# Patient Record
Sex: Female | Born: 1960 | Race: White | Hispanic: No | Marital: Single | Smoking: Current every day smoker
Health system: Southern US, Community
[De-identification: ages and names within clinical notes are randomized; demographics above are authoritative.]

## PROBLEM LIST (undated history)

## (undated) DIAGNOSIS — J302 Other seasonal allergic rhinitis: Secondary | ICD-10-CM

## (undated) DIAGNOSIS — J449 Chronic obstructive pulmonary disease, unspecified: Secondary | ICD-10-CM

## (undated) DIAGNOSIS — Z720 Tobacco use: Secondary | ICD-10-CM

## (undated) DIAGNOSIS — R011 Cardiac murmur, unspecified: Secondary | ICD-10-CM

## (undated) DIAGNOSIS — J45909 Unspecified asthma, uncomplicated: Secondary | ICD-10-CM

## (undated) DIAGNOSIS — C439 Malignant melanoma of skin, unspecified: Secondary | ICD-10-CM

## (undated) DIAGNOSIS — F329 Major depressive disorder, single episode, unspecified: Secondary | ICD-10-CM

## (undated) DIAGNOSIS — I10 Essential (primary) hypertension: Secondary | ICD-10-CM

## (undated) DIAGNOSIS — R51 Headache: Secondary | ICD-10-CM

## (undated) HISTORY — DX: Cardiac murmur, unspecified: R01.1

## (undated) HISTORY — DX: Essential (primary) hypertension: I10

## (undated) HISTORY — PX: NOSE SURGERY: SHX723

## (undated) HISTORY — DX: Malignant melanoma of skin, unspecified: C43.9

## (undated) HISTORY — PX: TONSILLECTOMY: SUR1361

## (undated) HISTORY — PX: THROAT SURGERY: SHX803

## (undated) HISTORY — DX: Other seasonal allergic rhinitis: J30.2

## (undated) HISTORY — PX: ABDOMINAL HYSTERECTOMY: SHX81

---

## 2004-06-02 ENCOUNTER — Emergency Department (HOSPITAL_COMMUNITY): Admission: EM | Admit: 2004-06-02 | Discharge: 2004-06-02 | Payer: Self-pay | Admitting: Emergency Medicine

## 2004-10-26 ENCOUNTER — Emergency Department (HOSPITAL_COMMUNITY): Admission: EM | Admit: 2004-10-26 | Discharge: 2004-10-26 | Payer: Self-pay | Admitting: Emergency Medicine

## 2004-12-09 ENCOUNTER — Ambulatory Visit: Payer: Self-pay | Admitting: Internal Medicine

## 2005-08-19 ENCOUNTER — Emergency Department (HOSPITAL_COMMUNITY): Admission: EM | Admit: 2005-08-19 | Discharge: 2005-08-19 | Payer: Self-pay | Admitting: Emergency Medicine

## 2006-12-19 ENCOUNTER — Emergency Department (HOSPITAL_COMMUNITY): Admission: EM | Admit: 2006-12-19 | Discharge: 2006-12-19 | Payer: Self-pay | Admitting: Emergency Medicine

## 2007-06-29 ENCOUNTER — Emergency Department (HOSPITAL_COMMUNITY): Admission: EM | Admit: 2007-06-29 | Discharge: 2007-06-29 | Payer: Self-pay | Admitting: Emergency Medicine

## 2008-10-07 ENCOUNTER — Emergency Department (HOSPITAL_COMMUNITY): Admission: EM | Admit: 2008-10-07 | Discharge: 2008-10-07 | Payer: Self-pay | Admitting: Emergency Medicine

## 2008-11-22 ENCOUNTER — Emergency Department (HOSPITAL_COMMUNITY): Admission: EM | Admit: 2008-11-22 | Discharge: 2008-11-22 | Payer: Self-pay | Admitting: Family Medicine

## 2009-12-17 ENCOUNTER — Emergency Department (HOSPITAL_COMMUNITY): Admission: EM | Admit: 2009-12-17 | Discharge: 2009-12-17 | Payer: Self-pay | Admitting: Emergency Medicine

## 2010-04-11 ENCOUNTER — Emergency Department (HOSPITAL_COMMUNITY): Admission: EM | Admit: 2010-04-11 | Discharge: 2010-04-11 | Payer: Self-pay | Admitting: Emergency Medicine

## 2011-02-07 ENCOUNTER — Emergency Department (HOSPITAL_COMMUNITY)
Admission: EM | Admit: 2011-02-07 | Discharge: 2011-02-07 | Disposition: A | Discharge status: Home / Self Care | Payer: PRIVATE HEALTH INSURANCE | Attending: Emergency Medicine | Admitting: Emergency Medicine

## 2011-02-07 DIAGNOSIS — R062 Wheezing: Secondary | ICD-10-CM | POA: Insufficient documentation

## 2011-02-07 DIAGNOSIS — R0989 Other specified symptoms and signs involving the circulatory and respiratory systems: Secondary | ICD-10-CM | POA: Diagnosis not present

## 2011-02-07 DIAGNOSIS — J449 Chronic obstructive pulmonary disease, unspecified: Secondary | ICD-10-CM | POA: Diagnosis not present

## 2011-02-07 DIAGNOSIS — R0602 Shortness of breath: Secondary | ICD-10-CM | POA: Insufficient documentation

## 2011-02-07 DIAGNOSIS — R0609 Other forms of dyspnea: Secondary | ICD-10-CM | POA: Insufficient documentation

## 2011-02-07 DIAGNOSIS — J4489 Other specified chronic obstructive pulmonary disease: Secondary | ICD-10-CM | POA: Insufficient documentation

## 2011-02-16 ENCOUNTER — Emergency Department (HOSPITAL_COMMUNITY)
Admission: EM | Admit: 2011-02-16 | Discharge: 2011-02-16 | Disposition: A | Discharge status: Home / Self Care | Payer: PRIVATE HEALTH INSURANCE | Attending: Emergency Medicine | Admitting: Emergency Medicine

## 2011-02-16 DIAGNOSIS — R0989 Other specified symptoms and signs involving the circulatory and respiratory systems: Secondary | ICD-10-CM | POA: Insufficient documentation

## 2011-02-16 DIAGNOSIS — J449 Chronic obstructive pulmonary disease, unspecified: Secondary | ICD-10-CM | POA: Insufficient documentation

## 2011-02-16 DIAGNOSIS — R0602 Shortness of breath: Secondary | ICD-10-CM | POA: Insufficient documentation

## 2011-02-16 DIAGNOSIS — J4489 Other specified chronic obstructive pulmonary disease: Secondary | ICD-10-CM | POA: Insufficient documentation

## 2011-02-16 DIAGNOSIS — R0609 Other forms of dyspnea: Secondary | ICD-10-CM | POA: Insufficient documentation

## 2011-02-17 ENCOUNTER — Inpatient Hospital Stay (HOSPITAL_COMMUNITY)
Admission: EM | Admit: 2011-02-17 | Discharge: 2011-02-19 | DRG: 192 | Disposition: A | Discharge status: Home / Self Care | Payer: PRIVATE HEALTH INSURANCE | Attending: Internal Medicine | Admitting: Internal Medicine

## 2011-02-17 ENCOUNTER — Emergency Department (HOSPITAL_COMMUNITY): Payer: PRIVATE HEALTH INSURANCE

## 2011-02-17 DIAGNOSIS — J440 Chronic obstructive pulmonary disease with acute lower respiratory infection: Principal | ICD-10-CM | POA: Diagnosis present

## 2011-02-17 DIAGNOSIS — J209 Acute bronchitis, unspecified: Principal | ICD-10-CM | POA: Diagnosis present

## 2011-02-17 DIAGNOSIS — I10 Essential (primary) hypertension: Secondary | ICD-10-CM | POA: Diagnosis present

## 2011-02-17 DIAGNOSIS — T380X5A Adverse effect of glucocorticoids and synthetic analogues, initial encounter: Secondary | ICD-10-CM | POA: Diagnosis present

## 2011-02-17 DIAGNOSIS — F17200 Nicotine dependence, unspecified, uncomplicated: Secondary | ICD-10-CM | POA: Diagnosis present

## 2011-02-17 DIAGNOSIS — E139 Other specified diabetes mellitus without complications: Secondary | ICD-10-CM | POA: Diagnosis present

## 2011-02-17 LAB — CBC
Hemoglobin: 13.4 g/dL (ref 12.0–15.0)
MCH: 30.7 pg (ref 26.0–34.0)
Platelets: 244 10*3/uL (ref 150–400)
RBC: 4.37 MIL/uL (ref 3.87–5.11)
WBC: 5.6 10*3/uL (ref 4.0–10.5)

## 2011-02-17 LAB — CARDIAC PANEL(CRET KIN+CKTOT+MB+TROPI)
CK, MB: 1.9 ng/mL (ref 0.3–4.0)
Relative Index: INVALID (ref 0.0–2.5)
Relative Index: INVALID (ref 0.0–2.5)
Troponin I: 0.01 ng/mL (ref 0.00–0.06)

## 2011-02-17 LAB — D-DIMER, QUANTITATIVE: D-Dimer, Quant: 0.22 ug/mL-FEU (ref 0.00–0.48)

## 2011-02-17 LAB — COMPREHENSIVE METABOLIC PANEL
ALT: 40 U/L — ABNORMAL HIGH (ref 0–35)
AST: 31 U/L (ref 0–37)
Albumin: 3.8 g/dL (ref 3.5–5.2)
Alkaline Phosphatase: 92 U/L (ref 39–117)
CO2: 24 mEq/L (ref 19–32)
Chloride: 109 mEq/L (ref 96–112)
GFR calc Af Amer: >60 mL/min (ref >60)
GFR calc non Af Amer: >60 mL/min (ref >60)
Potassium: 4 mEq/L (ref 3.5–5.1)
Sodium: 141 mEq/L (ref 135–145)
Total Bilirubin: 0.5 mg/dL (ref 0.3–1.2)

## 2011-02-17 LAB — DIFFERENTIAL
Basophils Relative: 1 % (ref 0–1)
Eosinophils Absolute: 0 10*3/uL (ref 0.0–0.7)
Monocytes Relative: 1 % — ABNORMAL LOW (ref 3–12)
Neutro Abs: 5.1 10*3/uL (ref 1.7–7.7)
Neutrophils Relative %: 91 % — ABNORMAL HIGH (ref 43–77)

## 2011-02-17 LAB — TROPONIN I: Troponin I: 0.01 ng/mL (ref 0.00–0.06)

## 2011-02-17 LAB — GLUCOSE, CAPILLARY: Glucose-Capillary: 185 mg/dL — ABNORMAL HIGH (ref 70–99)
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
## 2011-02-18 LAB — COMPREHENSIVE METABOLIC PANEL
AST: 33 U/L (ref 0–37)
Albumin: 3.7 g/dL (ref 3.5–5.2)
BUN: 16 mg/dL (ref 6–23)
Creatinine, Ser: 0.81 mg/dL (ref 0.4–1.2)
GFR calc Af Amer: >60 mL/min (ref >60)
Potassium: 4.6 mEq/L (ref 3.5–5.1)
Total Protein: 7.1 g/dL (ref 6.0–8.3)

## 2011-02-18 LAB — DIFFERENTIAL
Basophils Relative: 0 % (ref 0–1)
Eosinophils Absolute: 0 10*3/uL (ref 0.0–0.7)
Eosinophils Relative: 0 % (ref 0–5)
Lymphs Abs: 0.5 10*3/uL — ABNORMAL LOW (ref 0.7–4.0)
Neutrophils Relative %: 92 % — ABNORMAL HIGH (ref 43–77)

## 2011-02-18 LAB — CBC
MCH: 30.4 pg (ref 26.0–34.0)
MCV: 92.9 fL (ref 78.0–100.0)
Platelets: 270 10*3/uL (ref 150–400)
RBC: 4.48 MIL/uL (ref 3.87–5.11)
RDW: 13.1 % (ref 11.5–15.5)
WBC: 8.7 10*3/uL (ref 4.0–10.5)

## 2011-02-18 LAB — GLUCOSE, CAPILLARY
Glucose-Capillary: 143 mg/dL — ABNORMAL HIGH (ref 70–99)
Glucose-Capillary: 186 mg/dL — ABNORMAL HIGH (ref 70–99)

## 2011-02-19 LAB — CBC
MCHC: 32.8 g/dL (ref 30.0–36.0)
Platelets: 265 10*3/uL (ref 150–400)
RDW: 13.1 % (ref 11.5–15.5)
WBC: 12.9 10*3/uL — ABNORMAL HIGH (ref 4.0–10.5)

## 2011-02-19 LAB — GLUCOSE, CAPILLARY: Glucose-Capillary: 109 mg/dL — ABNORMAL HIGH (ref 70–99)

## 2011-02-24 LAB — CULTURE, BLOOD (ROUTINE X 2)
Culture  Setup Time: 201204120013
Culture: NO GROWTH

## 2011-03-01 NOTE — Discharge Summary (Signed)
  [name], Lori Owens               ACCOUNT NO.:  [id number]  MEDICAL RECORD NO.:  [id number]           PATIENT TYPE:  I  LOCATION:  1533                         FACILITY:  [facility]  PHYSICIAN:  Hollice Espy, M.D.DATE OF BIRTH:  1961-03-02  DATE OF ADMISSION:  02/17/2011 DATE OF DISCHARGE:  02/19/2011                              DISCHARGE SUMMARY   PRIMARY CARE PHYSICIAN:  She has never established with a primary care physician.  I have given her a number of primary care doctors in town to establish with.  DISCHARGE DIAGNOSES: 1. Chronic obstructive pulmonary disease exacerbation. 2. Chronic tobacco abuse, quit 2 weeks ago. 3. Acute bronchitis episode.  DISCHARGE MEDICATIONS:  Discharge medications for this patient are as follows: 1. Avelox 400 mg p.o. daily. 2. Prednisone 10 mg taper, 60 mg on day #1, 50 mg on day #2, and so on     until finished. 3. Advair 250/50 one puff b.i.d. 4. Albuterol inhaler 2 puffs inhaled every 6 hours as needed. 5. Os-Cal D 1 tablet p.o. daily. 6. Fish oil 1 tablet p.o. daily. 7. Loratadine 10 p.o. daily. 8. Magnesium p.o. daily. 9. Multivitamin p.o. daily. 10.Vitamin B complex daily.  HOSPITAL COURSE:  The patient is a 50-year white female with past medical history of COPD and tobacco abuse who initially 2 weeks ago came in complaining of productive cough, found to have an elevated white count, and shortness of breath consistent with COPD exacerbation.  She showed no signs of pneumonia on x-ray.  She was started on IV Avelox, steroids, nebulizers and oxygen.  Over the next 2 days, she improved. Her steroids were tapered down.  She was still having some wheezing by day of discharge but her breathing was much better on room air and she is being discharged to home.  She will continue on prednisone taper, several more days of antibiotics for a total of 7-day course and she is advised to establish with a primary care physician.  She also has  not been to her pulmonologist in several years and she says she will indeed follow up.  DISPOSITION:  Her disposition is improved.  ACTIVITY:  Slowly increase.  DISCHARGE DIET:  Regular diet.  She is being discharged to home.  She tells me that she will definitely stay off the smoking.     Hollice Espy, M.D.     SKK/MEDQ  D:  02/19/2011  T:  02/19/2011  Job:  [id number]  cc:   [facility]  Electronically Signed by Virginia Rochester M.D. on 03/01/2011 02:02:06 PM

## 2011-05-23 ENCOUNTER — Emergency Department (HOSPITAL_COMMUNITY)
Admission: EM | Admit: 2011-05-23 | Discharge: 2011-05-23 | Discharge status: Against Medical Advice | Payer: PRIVATE HEALTH INSURANCE | Attending: Emergency Medicine | Admitting: Emergency Medicine

## 2011-05-23 DIAGNOSIS — J449 Chronic obstructive pulmonary disease, unspecified: Secondary | ICD-10-CM | POA: Insufficient documentation

## 2011-05-23 DIAGNOSIS — J4489 Other specified chronic obstructive pulmonary disease: Secondary | ICD-10-CM | POA: Insufficient documentation

## 2011-11-09 ENCOUNTER — Emergency Department (HOSPITAL_COMMUNITY): Payer: Self-pay

## 2011-11-09 ENCOUNTER — Emergency Department (HOSPITAL_COMMUNITY)
Admission: EM | Admit: 2011-11-09 | Discharge: 2011-11-09 | Disposition: A | Discharge status: Home / Self Care | Payer: Self-pay | Attending: Emergency Medicine | Admitting: Emergency Medicine

## 2011-11-09 ENCOUNTER — Encounter: Payer: Self-pay | Admitting: *Deleted

## 2011-11-09 DIAGNOSIS — R062 Wheezing: Secondary | ICD-10-CM | POA: Insufficient documentation

## 2011-11-09 DIAGNOSIS — R0602 Shortness of breath: Secondary | ICD-10-CM | POA: Insufficient documentation

## 2011-11-09 DIAGNOSIS — J441 Chronic obstructive pulmonary disease with (acute) exacerbation: Secondary | ICD-10-CM | POA: Insufficient documentation

## 2011-11-09 HISTORY — DX: Chronic obstructive pulmonary disease, unspecified: J44.9

## 2011-11-09 MED ORDER — PREDNISONE 20 MG PO TABS: ORAL_TABLET | ORAL | Status: AC

## 2011-11-09 MED ORDER — ALBUTEROL SULFATE (5 MG/ML) 0.5% IN NEBU
10.0000 mg | INHALATION_SOLUTION | Freq: Once | RESPIRATORY_TRACT | Status: AC
  Administered 2011-11-09: 10 mg via RESPIRATORY_TRACT
  Filled 2011-11-09: qty 2

## 2011-11-09 MED ORDER — ALBUTEROL SULFATE HFA 108 (90 BASE) MCG/ACT IN AERS: 2.0000 | INHALATION_SPRAY | RESPIRATORY_TRACT | Status: DC | PRN

## 2011-11-09 MED ORDER — PREDNISONE 20 MG PO TABS
60.0000 mg | ORAL_TABLET | Freq: Once | ORAL | Status: AC
  Administered 2011-11-09: 60 mg via ORAL
  Filled 2011-11-09: qty 3

## 2011-11-09 NOTE — ED Provider Notes (Signed)
History     CSN: [id number]  Arrival date & time 11/09/11  4098   First MD Initiated Contact with Patient 11/09/11 [phone]      Chief Complaint  Patient presents with  . Shortness of Breath    (Consider location/radiation/quality/duration/timing/severity/associated sxs/prior treatment) HPI This 51 year old female has a history of COPD and is still smoking and now presents with a 3 to four-day history nonproductive cough with no fever no body aches no vomiting or diarrhea no chest pain no confusion no rash no hallucinations. She ran out of her inhaler has had no treatment prior to arrival. Her symptoms are constant with shortness of breath and wheezing which is mild to moderate and she is still able to speak full sentences with mild to moderate respiratory distress upon arrival with room air pulse oximetry of the low end of normal at 91-92% room air. Past Medical History  Diagnosis Date  . COPD (chronic obstructive pulmonary disease)     Past Surgical History  Procedure Date  . Abdominal hysterectomy   . Tonsillectomy   . Throat surgery   . Nose surgery     No family history on file.  History  Substance Use Topics  . Smoking status: Current Everyday Smoker  . Smokeless tobacco: Not on file  . Alcohol Use: No    OB History    Grav Para Term Preterm Abortions TAB SAB Ect Mult Living                  Review of Systems  Constitutional: Negative for fever.       10 Systems reviewed and are negative for acute change except as noted in the HPI.  HENT: Negative for congestion.   Eyes: Negative for discharge and redness.  Respiratory: Positive for cough, shortness of breath and wheezing. Negative for stridor.   Cardiovascular: Negative for chest pain.  Gastrointestinal: Negative for vomiting and abdominal pain.  Musculoskeletal: Negative for back pain.  Skin: Negative for rash.  Neurological: Negative for syncope, numbness and headaches.  Psychiatric/Behavioral:       No  behavior change.    Allergies  Review of patient's allergies indicates no known allergies.  Home Medications   Current Outpatient Rx  Name Route Sig Dispense Refill  . ALBUTEROL SULFATE HFA 108 (90 BASE) MCG/ACT IN AERS Inhalation Inhale 2 puffs into the lungs every 6 (six) hours as needed. For shortness of breath     . ALBUTEROL SULFATE HFA 108 (90 BASE) MCG/ACT IN AERS Inhalation Inhale 2 puffs into the lungs every 2 (two) hours as needed for wheezing or shortness of breath (cough). 1 Inhaler 0  . PREDNISONE 20 MG PO TABS  2 tabs po daily x 4 days 8 tablet 0    BP 121/88  Pulse 95  Temp(Src) 98.8 F (37.1 C) (Oral)  Resp 22  SpO2 95%  Physical Exam  Nursing note and vitals reviewed. Constitutional:       Awake, alert, nontoxic appearance.  HENT:  Head: Atraumatic.  Mouth/Throat: Oropharynx is clear and moist.  Eyes: Pupils are equal, round, and reactive to light. Right eye exhibits no discharge. Left eye exhibits no discharge.  Neck: Neck supple.  Cardiovascular: Normal rate and regular rhythm.   No murmur heard. Pulmonary/Chest: She has wheezes. She has no rales. She exhibits no tenderness.       Mild to moderate respiratory distress still able to speak full sentences with room air pulse oximetry in the low end of  normal at 91-92%. Wheezes are diffuse without retractions or accessory muscle usage.  Abdominal: Soft. Bowel sounds are normal. There is no tenderness. There is no rebound.  Musculoskeletal: She exhibits no edema and no tenderness.       Baseline ROM, no obvious new focal weakness.  Neurological: She is alert.       Mental status and motor strength appears baseline for patient and situation. Normal gait.  Skin: No rash noted.  Psychiatric: She has a normal mood and affect.    ED Course  Procedures (including critical care time)  Labs Reviewed - No data to display Dg Chest 2 View  11/09/2011  *RADIOLOGY REPORT*  Clinical Data: Nonproductive cough.  History  of COPD.  CHEST - 2 VIEW  Comparison: 02/17/2011 and 12/17/2009.  Findings: The heart size and mediastinal contours are stable. There is stable chronic central airway thickening and interstitial prominence.  No superimposed airspace disease or edema is identified.  There is no pleural effusion.  Osseous structures appear unchanged.  IMPRESSION: Stable mild chronic lung disease.  No acute cardiopulmonary process.  Original Report Authenticated By: Gerrianne Scale, M.D.     1. COPD with acute exacerbation       MDM  Pt feels improved after observation and/or treatment in ED.  Patient / Family / Caregiver informed of clinical course, understand medical decision-making process, and agree with plan.        Hurman Horn, MD 11/09/11 [phone]

## 2011-11-09 NOTE — ED Notes (Signed)
Pt has history of COPD, last 4 days symptoms have become worse

## 2011-11-21 ENCOUNTER — Other Ambulatory Visit: Payer: Self-pay

## 2011-11-21 ENCOUNTER — Emergency Department (HOSPITAL_COMMUNITY): Payer: Self-pay

## 2011-11-21 ENCOUNTER — Encounter (HOSPITAL_COMMUNITY): Payer: Self-pay | Admitting: Emergency Medicine

## 2011-11-21 ENCOUNTER — Emergency Department (HOSPITAL_COMMUNITY)
Admission: EM | Admit: 2011-11-21 | Discharge: 2011-11-21 | Disposition: A | Discharge status: Home / Self Care | Payer: Self-pay | Attending: Emergency Medicine | Admitting: Emergency Medicine

## 2011-11-21 DIAGNOSIS — R0602 Shortness of breath: Secondary | ICD-10-CM | POA: Insufficient documentation

## 2011-11-21 DIAGNOSIS — R05 Cough: Secondary | ICD-10-CM | POA: Insufficient documentation

## 2011-11-21 DIAGNOSIS — J441 Chronic obstructive pulmonary disease with (acute) exacerbation: Secondary | ICD-10-CM | POA: Insufficient documentation

## 2011-11-21 DIAGNOSIS — R059 Cough, unspecified: Secondary | ICD-10-CM | POA: Insufficient documentation

## 2011-11-21 MED ORDER — ALBUTEROL SULFATE HFA 108 (90 BASE) MCG/ACT IN AERS: 1.0000 | INHALATION_SPRAY | Freq: Four times a day (QID) | RESPIRATORY_TRACT | Status: DC | PRN

## 2011-11-21 MED ORDER — IPRATROPIUM BROMIDE 0.02 % IN SOLN
0.5000 mg | Freq: Once | RESPIRATORY_TRACT | Status: AC
  Administered 2011-11-21: 0.5 mg via RESPIRATORY_TRACT
  Filled 2011-11-21: qty 2.5

## 2011-11-21 MED ORDER — ALBUTEROL SULFATE (5 MG/ML) 0.5% IN NEBU
5.0000 mg | INHALATION_SOLUTION | Freq: Once | RESPIRATORY_TRACT | Status: AC
  Administered 2011-11-21: 5 mg via RESPIRATORY_TRACT
  Filled 2011-11-21: qty 1

## 2011-11-21 MED ORDER — PREDNISONE 20 MG PO TABS: 40.0000 mg | ORAL_TABLET | Freq: Every day | ORAL | Status: AC

## 2011-11-21 MED ORDER — PREDNISONE 20 MG PO TABS
60.0000 mg | ORAL_TABLET | Freq: Once | ORAL | Status: AC
  Administered 2011-11-21: 60 mg via ORAL
  Filled 2011-11-21 (×2): qty 3

## 2011-11-21 MED ORDER — AZITHROMYCIN 250 MG PO TABS: 250.0000 mg | ORAL_TABLET | Freq: Every day | ORAL | Status: AC

## 2011-11-21 NOTE — ED Notes (Signed)
Patient given discharge instructions, information, prescriptions, and diet order. Patient states that they adequately understand discharge information given and to return to ED if symptoms return or worsen.     

## 2011-11-21 NOTE — ED Notes (Signed)
Patient reports worsening SOB x 3-4 days. Also complains of DOE and orthopnea. Denies CP at this time.

## 2011-11-21 NOTE — ED Provider Notes (Signed)
History     CSN: [id number]  Arrival date & time 11/21/11  0536   First MD Initiated Contact with Patient 11/21/11 [phone]      Chief Complaint  Patient presents with  . Shortness of Breath    (Consider location/radiation/quality/duration/timing/severity/associated sxs/prior treatment) HPI Comments: PMH significant for COPD.  Patient comes in with a chief complaint of shortness of breath and dry cough for the past 4 days.  She reports that she had been using Albuterol inhaler for her symptoms with mild relief.  She ran out of her inhaler yesterday.  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  She was seen for similar complaint on 11/09/11 and was discharged home with albuterol inhaler and 4 days of 40mg  prednisone.  She reports that she currently does not smoke.  She quit smoking 3 weeks ago.  She had smoked 1ppd for approximately 20 years.  Patient is a 51 y.o. female presenting with shortness of breath. The history is provided by the patient.  Shortness of Breath  Episode onset: 4 days ago. Associated symptoms include cough, shortness of breath and wheezing. Pertinent negatives include no chest pain, no orthopnea, no fever and no sore throat. She has had intermittent steroid use.    Past Medical History  Diagnosis Date  . COPD (chronic obstructive pulmonary disease)     Past Surgical History  Procedure Date  . Abdominal hysterectomy   . Tonsillectomy   . Throat surgery   . Nose surgery     History reviewed. No pertinent family history.  History  Substance Use Topics  . Smoking status: Former Smoker    Quit date: 11/09/2011  . Smokeless tobacco: Not on file  . Alcohol Use: No    OB History    Grav Para Term Preterm Abortions TAB SAB Ect Mult Living                  Review of Systems  Constitutional: Negative for fever, chills and diaphoresis.  HENT: Negative for sore throat.   Respiratory: Positive for cough,  shortness of breath and wheezing.   Cardiovascular: Negative for chest pain and orthopnea.  Neurological: Negative for dizziness, syncope and light-headedness.    Allergies  Review of patient's allergies indicates no known allergies.  Home Medications   Current Outpatient Rx  Name Route Sig Dispense Refill  . ALBUTEROL SULFATE HFA 108 (90 BASE) MCG/ACT IN AERS Inhalation Inhale 2 puffs into the lungs every 2 (two) hours as needed for wheezing or shortness of breath (cough). 1 Inhaler 0  . ALBUTEROL SULFATE HFA 108 (90 BASE) MCG/ACT IN AERS Inhalation Inhale 1-2 puffs into the lungs every 6 (six) hours as needed for wheezing. 1 Inhaler 0  . AZITHROMYCIN 250 MG PO TABS Oral Take 1 tablet (250 mg total) by mouth daily. Take first 2 tablets together, then 1 every day until finished. 6 tablet 0  . PREDNISONE 20 MG PO TABS Oral Take 2 tablets (40 mg total) by mouth daily. 10 tablet 0    BP 130/85  Pulse 87  Temp(Src) 98 F (36.7 C) (Oral)  Resp 22  SpO2 90%  Physical Exam  Nursing note and vitals reviewed. Constitutional: She is oriented to person, place, and time. She appears well-developed and well-nourished. No distress.  HENT:  Head: Normocephalic and atraumatic.  Eyes: EOM are normal. Pupils are equal, round, and reactive to light.  Neck: Normal range of  motion. Neck supple.  Cardiovascular: Normal rate, regular rhythm and normal heart sounds.   Pulmonary/Chest: No accessory muscle usage. She has wheezes. She has rhonchi. She has no rales.       Prolonged expiration. Diffuse expiratory wheezing bilaterally. Patient able to speak in complete sentences  Neurological: She is alert and oriented to person, place, and time.  Skin: Skin is warm and dry. She is not diaphoretic.  Psychiatric: She has a normal mood and affect.    ED Course  Procedures (including critical care time)  Labs Reviewed - No data to display Dg Chest 2 View  11/21/2011  *RADIOLOGY REPORT*  Clinical Data:  Shortness of breath for several days; history of smoking.  CHEST - 2 VIEW  Comparison: Chest radiograph performed 11/09/2011  Findings: The lungs are well-aerated.  Chronic peribronchial thickening is noted.  There is no evidence of focal opacification, pleural effusion or pneumothorax.  The heart is normal in size; the mediastinal contour is within normal limits.  No acute osseous abnormalities are seen.  IMPRESSION: Chronic peribronchial thickening noted; lungs otherwise clear.  Original Report Authenticated By: Tonia Ghent, M.D.     1. COPD exacerbation     7:00 AM Reassessed patient.  Patient reporting that her breathing has improved after two breathing treatments and 60mg  prednisone.  Her oxygen sat is currently 92 on RA.  Lungs with diffuse expiratory wheezing.  Will order another breathing treatment and reassess once completed.   Date: 11/21/2011  Rate: 91  Rhythm: normal sinus rhythm  QRS Axis: normal  Intervals: normal  ST/T Wave abnormalities: normal  Conduction Disutrbances:none  Narrative Interpretation:   Old EKG Reviewed: unchanged   9:11 PM Patient ambulated in the hallway.  With ambulation her pulse ox fluctuated between 88 and 95 on RA.  Feel that patient can be discharged home.  Patient in agreement with the plan.  She states that she is feeling much better at this time.  Patient discussed with Dr. Freida Busman who is also in agreement with the plan.  MDM  Patient with diffuse wheezing and shortness of breath consistent with COPD exacerbation.  Patient reports that she felt much better after 3 breathing treatments and 60 mg prednisone orally.  Patient did not desat with ambulation.  Feel that patient can be discharged home with Azithromycin, albuterol inhaler, and course of Prednisone.  Pt instructed to follow up with PCP.        Pascal Lux [facility] 11/21/11 [phone]

## 2011-11-21 NOTE — ED Notes (Signed)
Patient states that she has been SOB x 4 days

## 2011-11-22 NOTE — ED Provider Notes (Signed)
Medical screening examination/treatment/procedure(s) were performed by non-physician practitioner and as supervising physician I was immediately available for consultation/collaboration.   Dione Booze, MD 11/22/11 [phone]

## 2011-12-01 ENCOUNTER — Encounter (HOSPITAL_COMMUNITY): Payer: Self-pay | Admitting: *Deleted

## 2011-12-01 ENCOUNTER — Emergency Department (HOSPITAL_COMMUNITY)
Admission: EM | Admit: 2011-12-01 | Discharge: 2011-12-02 | Disposition: A | Discharge status: Home / Self Care | Payer: Self-pay | Attending: Emergency Medicine | Admitting: Emergency Medicine

## 2011-12-01 ENCOUNTER — Emergency Department (HOSPITAL_COMMUNITY): Payer: Self-pay

## 2011-12-01 DIAGNOSIS — R0682 Tachypnea, not elsewhere classified: Secondary | ICD-10-CM | POA: Insufficient documentation

## 2011-12-01 DIAGNOSIS — R062 Wheezing: Secondary | ICD-10-CM | POA: Insufficient documentation

## 2011-12-01 DIAGNOSIS — J441 Chronic obstructive pulmonary disease with (acute) exacerbation: Secondary | ICD-10-CM | POA: Insufficient documentation

## 2011-12-01 MED ORDER — IPRATROPIUM BROMIDE 0.02 % IN SOLN
0.5000 mg | Freq: Once | RESPIRATORY_TRACT | Status: AC
  Administered 2011-12-02: 0.5 mg via RESPIRATORY_TRACT
  Filled 2011-12-01: qty 2.5

## 2011-12-01 MED ORDER — ALBUTEROL SULFATE (5 MG/ML) 0.5% IN NEBU
5.0000 mg | INHALATION_SOLUTION | Freq: Once | RESPIRATORY_TRACT | Status: AC
  Administered 2011-12-02: 5 mg via RESPIRATORY_TRACT
  Filled 2011-12-01: qty 0.5

## 2011-12-01 MED ORDER — PREDNISONE 20 MG PO TABS
60.0000 mg | ORAL_TABLET | Freq: Once | ORAL | Status: AC
  Administered 2011-12-02: 60 mg via ORAL
  Filled 2011-12-01: qty 3

## 2011-12-01 NOTE — ED Notes (Addendum)
Pt in c/o shortness of breath x4 days with cough and congestion, pt states no relief with home inhalers

## 2011-12-01 NOTE — ED Notes (Signed)
SOA for the past four days--History of COPD and smokes less than one half pack per day---SOA has not been relieved with using Ventolin inhaler--Pulse ox 92%  Placed on 02 at 2 liters  ---  Brought p.o. Up to 95%--tight expiratory wheeze all fields

## 2011-12-01 NOTE — ED Provider Notes (Signed)
History     CSN: [id number]  Arrival date & time 12/01/11  2322   First MD Initiated Contact with Patient 12/01/11 2340      Chief Complaint  Patient presents with  . Shortness of Breath     HPI  History provided by the patient. Patient is a 51 year old female with history of COPD who was still a current smoker presenting with complaints of shortness of breath and wheezing similar to previous COPD exacerbation. Patient reports visiting emergency room several times this month for similar symptoms. She reports using albuterol inhaler at home without improvement over the past 4 days. Patient has had a associated slight cough is nonproductive. She denies any fever, chills, sweats. Patient denies any chest pain, heart palpitations, lightheadedness or near syncope. Patient states that she is currently without medical insurance and has been for the past 3 months so she has been unable to followup with primary care provider. Patient has started a new job and hopes to have insurance shortly.   Past Medical History  Diagnosis Date  . COPD (chronic obstructive pulmonary disease)     Past Surgical History  Procedure Date  . Abdominal hysterectomy   . Tonsillectomy   . Throat surgery   . Nose surgery     History reviewed. No pertinent family history.  History  Substance Use Topics  . Smoking status: Former Smoker    Quit date: 11/09/2011  . Smokeless tobacco: Not on file  . Alcohol Use: No    OB History    Grav Para Term Preterm Abortions TAB SAB Ect Mult Living                  Review of Systems  Constitutional: Negative for fever and chills.  Respiratory: Positive for cough, shortness of breath and wheezing.   Cardiovascular: Negative for chest pain and palpitations.  Gastrointestinal: Negative for abdominal pain.  All other systems reviewed and are negative.    Allergies  Review of patient's allergies indicates no known allergies.  Home Medications   Current  Outpatient Rx  Name Route Sig Dispense Refill  . ALBUTEROL SULFATE HFA 108 (90 BASE) MCG/ACT IN AERS Inhalation Inhale 1-2 puffs into the lungs every 6 (six) hours as needed for wheezing. 1 Inhaler 0  . PREDNISONE 20 MG PO TABS Oral Take 2 tablets (40 mg total) by mouth daily. 10 tablet 0    BP 158/103  Pulse 106  Temp(Src) 98.5 F (36.9 C) (Oral)  Resp 18  SpO2 94%  Physical Exam  Nursing note and vitals reviewed. Constitutional: She is oriented to person, place, and time. She appears well-developed and well-nourished. No distress.  HENT:  Head: Normocephalic and atraumatic.  Mouth/Throat: Oropharynx is clear and moist.  Cardiovascular: Normal rate and regular rhythm.   Pulmonary/Chest: Tachypnea noted. No respiratory distress. She has wheezes. She has no rales.  Abdominal: Soft. Bowel sounds are normal. There is no tenderness.  Musculoskeletal: She exhibits no edema and no tenderness.  Neurological: She is alert and oriented to person, place, and time.  Skin: Skin is warm and dry. No rash noted.  Psychiatric: She has a normal mood and affect. Her behavior is normal.    ED Course  Procedures   Labs Reviewed - No data to display Dg Chest 2 View  12/01/2011  *RADIOLOGY REPORT*  Clinical Data: Shortness of breath.  CHEST - 2 VIEW  Comparison: PA and lateral chest 11/21/2011.  Findings: Peribronchial thickening is again seen.  No  focal airspace disease or effusion.  No pneumothorax.  Heart size normal.  IMPRESSION: No acute disease.  Stable compared to prior exam.  Original Report Authenticated By: Bernadene Bell. D'ALESSIO, M.D.     1. COPD exacerbation       MDM  11:30 PM patient seen and evaluated. Patient no acute distress. Patient with unlabored breathing and no signs for respiratory distress. Patient with diffuse wheezing and plan to give breathing treatments. She has multiple similar visits recently.  12 AM patient reports slight improvement after first breathing treatment.  Patient with continued wheezing on exam. O2 saturation 95% room air at rest. Patient in no respiratory distress. Will give second breathing treatment and reassess.  Patient still complains of some shortness of breath. She feels slightly better after second breathing treatment. Patient now with O2 sats ranging between 88-90 on room air. She is 92% on 2 L. Patient still has diffuse wheezing on exam.  She feels much better after third breathing treatment and continued observation in the emergency room through the night. She has been up ambulating with O2 sats at 91% on room air. At rest her O2 sat is 93-94%. She still has expiratory wheezing but significant improvement on inspiratory wheezing. At this time patient requests to return home. I plan to provide prescriptions for prednisone taper dose and Advair Diskus which patient has used in the past. Patient has been advised to return if symptoms worsen.  Angus Seller, [place] 12/02/11 [phone]

## 2011-12-02 LAB — POCT I-STAT, CHEM 8
Calcium, Ion: 1.18 mmol/L (ref 1.12–1.32)
Glucose, Bld: 144 mg/dL — ABNORMAL HIGH (ref 70–99)
HCT: 41 % (ref 36.0–46.0)
Hemoglobin: 13.9 g/dL (ref 12.0–15.0)
Potassium: 3.7 mEq/L (ref 3.5–5.1)
TCO2: 25 mmol/L (ref 0–100)

## 2011-12-02 LAB — DIFFERENTIAL
Eosinophils Absolute: 0.2 10*3/uL (ref 0.0–0.7)
Eosinophils Relative: 2 % (ref 0–5)
Lymphs Abs: 0.9 10*3/uL (ref 0.7–4.0)
Monocytes Absolute: 0.2 10*3/uL (ref 0.1–1.0)
Monocytes Relative: 2 % — ABNORMAL LOW (ref 3–12)

## 2011-12-02 LAB — CBC
HCT: 39.4 % (ref 36.0–46.0)
Hemoglobin: 13.3 g/dL (ref 12.0–15.0)
MCH: 30.9 pg (ref 26.0–34.0)
MCV: 91.4 fL (ref 78.0–100.0)
RBC: 4.31 MIL/uL (ref 3.87–5.11)

## 2011-12-02 MED ORDER — IPRATROPIUM BROMIDE 0.02 % IN SOLN
0.5000 mg | Freq: Once | RESPIRATORY_TRACT | Status: AC
  Administered 2011-12-02: 0.5 mg via RESPIRATORY_TRACT
  Filled 2011-12-02: qty 2.5

## 2011-12-02 MED ORDER — ALBUTEROL SULFATE (5 MG/ML) 0.5% IN NEBU
5.0000 mg | INHALATION_SOLUTION | Freq: Once | RESPIRATORY_TRACT | Status: AC
  Administered 2011-12-02: 5 mg via RESPIRATORY_TRACT
  Filled 2011-12-02: qty 1

## 2011-12-02 MED ORDER — ALBUTEROL SULFATE (5 MG/ML) 0.5% IN NEBU
10.0000 mg | INHALATION_SOLUTION | Freq: Once | RESPIRATORY_TRACT | Status: AC
  Administered 2011-12-02: 10 mg via RESPIRATORY_TRACT
  Filled 2011-12-02: qty 2

## 2011-12-02 MED ORDER — ALBUTEROL SULFATE HFA 108 (90 BASE) MCG/ACT IN AERS
2.0000 | INHALATION_SPRAY | RESPIRATORY_TRACT | Status: DC | PRN
  Administered 2011-12-02: 2 via RESPIRATORY_TRACT
  Filled 2011-12-02: qty 6.7

## 2011-12-02 MED ORDER — PREDNISONE 10 MG PO TABS: ORAL_TABLET | ORAL | Status: DC

## 2011-12-02 MED ORDER — IPRATROPIUM BROMIDE 0.02 % IN SOLN
1.0000 mg | Freq: Once | RESPIRATORY_TRACT | Status: AC
  Administered 2011-12-02: 1 mg via RESPIRATORY_TRACT
  Filled 2011-12-02: qty 5

## 2011-12-02 MED ORDER — FLUTICASONE-SALMETEROL 250-50 MCG/DOSE IN AEPB: 1.0000 | INHALATION_SPRAY | Freq: Two times a day (BID) | RESPIRATORY_TRACT | Status: DC

## 2011-12-02 NOTE — ED Notes (Signed)
Resp. Therapy called for nebs

## 2011-12-02 NOTE — ED Notes (Signed)
Neb trt in progress 

## 2011-12-02 NOTE — ED Notes (Signed)
Lying on carrier resting---reports feeling less SOA ---pulse ox 91% on room air while resting on stretcher.

## 2011-12-02 NOTE — ED Notes (Signed)
B/P 139/87  HR 110  Pulse Ox 94% on 2 liters of 02

## 2011-12-02 NOTE — ED Notes (Signed)
Sleeping on carrier--Pulse Ox 94% on 2 liters of 02

## 2011-12-02 NOTE — ED Notes (Addendum)
Neb. Trt. In progress

## 2011-12-02 NOTE — ED Notes (Signed)
Following hour long neb--pulse ox 89% on room air---placed on 02 at 2 liters--pulse ox 91%

## 2011-12-02 NOTE — ED Provider Notes (Signed)
Medical screening examination/treatment/procedure(s) were performed by non-physician practitioner and as supervising physician I was immediately available for consultation/collaboration.   Adaja Wander L Larya Charpentier, MD 12/02/11 0600 

## 2011-12-02 NOTE — ED Notes (Signed)
Post neb trt  Pulse ox 91%--placed back on 2 liters--pulse ox increased to 95%  Lung sounds are more coarse--audible expiratory wheezes.

## 2012-08-05 DIAGNOSIS — Z87891 Personal history of nicotine dependence: Secondary | ICD-10-CM | POA: Insufficient documentation

## 2012-08-05 DIAGNOSIS — J441 Chronic obstructive pulmonary disease with (acute) exacerbation: Secondary | ICD-10-CM | POA: Insufficient documentation

## 2012-08-06 ENCOUNTER — Encounter (HOSPITAL_COMMUNITY): Payer: Self-pay | Admitting: Emergency Medicine

## 2012-08-06 ENCOUNTER — Emergency Department (HOSPITAL_COMMUNITY): Payer: Self-pay

## 2012-08-06 ENCOUNTER — Emergency Department (HOSPITAL_COMMUNITY)
Admission: EM | Admit: 2012-08-06 | Discharge: 2012-08-06 | Disposition: A | Discharge status: Home / Self Care | Payer: Self-pay | Attending: Emergency Medicine | Admitting: Emergency Medicine

## 2012-08-06 DIAGNOSIS — J441 Chronic obstructive pulmonary disease with (acute) exacerbation: Secondary | ICD-10-CM

## 2012-08-06 MED ORDER — PREDNISONE 10 MG PO TABS: 20.0000 mg | ORAL_TABLET | Freq: Every day | ORAL | Status: DC

## 2012-08-06 MED ORDER — METHYLPREDNISOLONE SODIUM SUCC 125 MG IJ SOLR
125.0000 mg | Freq: Once | INTRAMUSCULAR | Status: AC
  Administered 2012-08-06: 125 mg via INTRAVENOUS
  Filled 2012-08-06: qty 2

## 2012-08-06 MED ORDER — IPRATROPIUM BROMIDE 0.02 % IN SOLN
0.5000 mg | Freq: Once | RESPIRATORY_TRACT | Status: AC
  Administered 2012-08-06: 0.5 mg via RESPIRATORY_TRACT
  Filled 2012-08-06: qty 2.5

## 2012-08-06 MED ORDER — ALBUTEROL SULFATE (5 MG/ML) 0.5% IN NEBU
5.0000 mg | INHALATION_SOLUTION | Freq: Once | RESPIRATORY_TRACT | Status: AC
  Administered 2012-08-06: 5 mg via RESPIRATORY_TRACT
  Filled 2012-08-06: qty 1

## 2012-08-06 NOTE — ED Notes (Signed)
Pt states she has been SHOB x 2 days, pt states she needs breathing treatment, states she has inhalers at home but feels they will not help at this point. No distress noted at this time, PWD

## 2012-08-06 NOTE — ED Provider Notes (Signed)
History     CSN: [id number]  Arrival date & time 08/05/12  2333   First MD Initiated Contact with Patient 08/06/12 0222      Chief Complaint  Patient presents with  . Shortness of Breath    (Consider location/radiation/quality/duration/timing/severity/associated sxs/prior treatment) HPI Pt with 2 days ot wheezing, cough with green sputum and nasal congestion. + sick contacts with URI. Pt has been using MDI with some improvement. No CP, fever, chills, Lower ext swelling or pain.  Past Medical History  Diagnosis Date  . COPD (chronic obstructive pulmonary disease)     Past Surgical History  Procedure Date  . Abdominal hysterectomy   . Tonsillectomy   . Throat surgery   . Nose surgery     No family history on file.  History  Substance Use Topics  . Smoking status: Former Smoker    Quit date: 11/09/2011  . Smokeless tobacco: Not on file  . Alcohol Use: No    OB History    Grav Para Term Preterm Abortions TAB SAB Ect Mult Living                  Review of Systems  Constitutional: Negative for fever and chills.  HENT: Positive for congestion and rhinorrhea. Negative for sore throat.   Respiratory: Positive for cough, shortness of breath and wheezing.   Cardiovascular: Negative for chest pain, palpitations and leg swelling.  Gastrointestinal: Negative for nausea, vomiting, abdominal pain and constipation.  Musculoskeletal: Negative for back pain.  Skin: Negative for rash and wound.  Neurological: Negative for dizziness, weakness, light-headedness, numbness and headaches.    Allergies  Review of patient's allergies indicates no known allergies.  Home Medications   Current Outpatient Rx  Name Route Sig Dispense Refill  . ALBUTEROL SULFATE HFA 108 (90 BASE) MCG/ACT IN AERS Inhalation Inhale 1-2 puffs into the lungs every 6 (six) hours as needed for wheezing. 1 Inhaler 0  . FLUTICASONE-SALMETEROL 250-50 MCG/DOSE IN AEPB Inhalation Inhale 1 puff into the lungs 2  (two) times daily. 60 each 0  . PREDNISONE 10 MG PO TABS  Take 6 tabs on day one, take 5 tabs on day 2, take 4 tabs on day 3, take 3 tabs on day 4, take 2 tablets on day 5, take 1 tab on day 6. 21 tablet 0  . PREDNISONE 10 MG PO TABS Oral Take 2 tablets (20 mg total) by mouth daily. 10 tablet 0    BP 132/94  Pulse 94  Temp 98.2 F (36.8 C) (Oral)  Resp 18  Ht 5\' 6"  (1.676 m)  Wt 195 lb (88.451 kg)  BMI 31.47 kg/m2  SpO2 96%  Physical Exam  Nursing note and vitals reviewed. Constitutional: She is oriented to person, place, and time. She appears well-developed and well-nourished. No distress.  HENT:  Head: Normocephalic and atraumatic.  Mouth/Throat: Oropharynx is clear and moist. No oropharyngeal exudate.  Eyes: EOM are normal. Pupils are equal, round, and reactive to light.  Neck: Normal range of motion. Neck supple.  Cardiovascular: Normal rate and regular rhythm.   Pulmonary/Chest: Effort normal. No respiratory distress. She has wheezes. She has no rales.  Abdominal: Soft. Bowel sounds are normal. There is no tenderness. There is no rebound and no guarding.  Musculoskeletal: Normal range of motion. She exhibits no edema and no tenderness.  Neurological: She is alert and oriented to person, place, and time.  Skin: Skin is warm and dry. No rash noted. No erythema.  Psychiatric:  She has a normal mood and affect. Her behavior is normal.    ED Course  Procedures (including critical care time)  Labs Reviewed - No data to display Dg Chest 2 View  08/06/2012  *RADIOLOGY REPORT*  Clinical Data: Short of breath  CHEST - 2 VIEW  Comparison: Chest radiograph 12/01/2011  Findings: Normal mediastinum and cardiac silhouette.  Normal pulmonary  vasculature.  No evidence of effusion, infiltrate, or pneumothorax.  No acute bony abnormality.  IMPRESSION: No acute cardiopulmonary process.   Original Report Authenticated By: Genevive Bi, M.D.      1. COPD exacerbation       MDM  Will  treat COPD exacerbation and reassess   Pt states she feels much better. Wheezing is improved.      Loren Racer, MD 08/06/12 [phone]

## 2012-08-12 ENCOUNTER — Emergency Department (HOSPITAL_COMMUNITY): Payer: Self-pay

## 2012-08-12 ENCOUNTER — Encounter (HOSPITAL_COMMUNITY): Payer: Self-pay | Admitting: Emergency Medicine

## 2012-08-12 ENCOUNTER — Emergency Department (HOSPITAL_COMMUNITY)
Admission: EM | Admit: 2012-08-12 | Discharge: 2012-08-12 | Disposition: A | Discharge status: Home / Self Care | Payer: Self-pay | Attending: Emergency Medicine | Admitting: Emergency Medicine

## 2012-08-12 DIAGNOSIS — R42 Dizziness and giddiness: Secondary | ICD-10-CM | POA: Insufficient documentation

## 2012-08-12 DIAGNOSIS — J40 Bronchitis, not specified as acute or chronic: Secondary | ICD-10-CM | POA: Insufficient documentation

## 2012-08-12 DIAGNOSIS — R0602 Shortness of breath: Secondary | ICD-10-CM | POA: Insufficient documentation

## 2012-08-12 DIAGNOSIS — J449 Chronic obstructive pulmonary disease, unspecified: Secondary | ICD-10-CM | POA: Insufficient documentation

## 2012-08-12 DIAGNOSIS — J4489 Other specified chronic obstructive pulmonary disease: Secondary | ICD-10-CM | POA: Insufficient documentation

## 2012-08-12 LAB — CBC WITH DIFFERENTIAL/PLATELET
Basophils Absolute: 0.1 10*3/uL (ref 0.0–0.1)
Basophils Relative: 1 % (ref 0–1)
Eosinophils Absolute: 0.7 10*3/uL (ref 0.0–0.7)
MCHC: 33.3 g/dL (ref 30.0–36.0)
Neutro Abs: 6.9 10*3/uL (ref 1.7–7.7)
Neutrophils Relative %: 66 % (ref 43–77)
Platelets: 272 10*3/uL (ref 150–400)
RDW: 12.4 % (ref 11.5–15.5)

## 2012-08-12 LAB — BASIC METABOLIC PANEL
Chloride: 103 mEq/L (ref 96–112)
GFR calc Af Amer: >90 mL/min (ref >90)
GFR calc non Af Amer: >90 mL/min (ref >90)
Potassium: 4.1 mEq/L (ref 3.5–5.1)

## 2012-08-12 LAB — POCT I-STAT TROPONIN I: Troponin i, poc: 0 ng/mL (ref 0.00–0.08)

## 2012-08-12 MED ORDER — ONDANSETRON 8 MG PO TBDP
8.0000 mg | ORAL_TABLET | Freq: Once | ORAL | Status: AC
  Administered 2012-08-12: 8 mg via ORAL
  Filled 2012-08-12: qty 1

## 2012-08-12 MED ORDER — IPRATROPIUM BROMIDE 0.02 % IN SOLN
0.5000 mg | Freq: Once | RESPIRATORY_TRACT | Status: AC
  Administered 2012-08-12: 0.5 mg via RESPIRATORY_TRACT

## 2012-08-12 MED ORDER — AZITHROMYCIN 250 MG PO TABS: 250.0000 mg | ORAL_TABLET | Freq: Every day | ORAL | Status: DC

## 2012-08-12 MED ORDER — IPRATROPIUM BROMIDE 0.02 % IN SOLN
RESPIRATORY_TRACT | Status: AC
  Filled 2012-08-12: qty 2.5

## 2012-08-12 MED ORDER — PREDNISONE 20 MG PO TABS: 40.0000 mg | ORAL_TABLET | Freq: Every day | ORAL | Status: DC

## 2012-08-12 MED ORDER — IPRATROPIUM BROMIDE 0.02 % IN SOLN
0.5000 mg | Freq: Once | RESPIRATORY_TRACT | Status: AC
  Administered 2012-08-12: 0.5 mg via RESPIRATORY_TRACT
  Filled 2012-08-12: qty 2.5

## 2012-08-12 MED ORDER — ALBUTEROL SULFATE (5 MG/ML) 0.5% IN NEBU
2.5000 mg | INHALATION_SOLUTION | Freq: Once | RESPIRATORY_TRACT | Status: AC
  Administered 2012-08-12: 2.5 mg via RESPIRATORY_TRACT
  Filled 2012-08-12: qty 0.5

## 2012-08-12 MED ORDER — ALBUTEROL SULFATE (5 MG/ML) 0.5% IN NEBU
2.5000 mg | INHALATION_SOLUTION | Freq: Once | RESPIRATORY_TRACT | Status: AC
  Administered 2012-08-12: 2.5 mg via RESPIRATORY_TRACT
  Filled 2012-08-12: qty 1

## 2012-08-12 MED ORDER — PREDNISONE 20 MG PO TABS
60.0000 mg | ORAL_TABLET | Freq: Once | ORAL | Status: AC
  Administered 2012-08-12: 60 mg via ORAL
  Filled 2012-08-12: qty 3

## 2012-08-12 NOTE — ED Notes (Signed)
Respirations even and unlabored, chest rise and fall equal bilaterally, 95% on RA.  Pt does not appear to be in distress at this time.

## 2012-08-12 NOTE — ED Provider Notes (Signed)
History     CSN: [id number]  Arrival date & time 08/12/12  1719   First MD Initiated Contact with Patient 08/12/12 1809      Chief Complaint  Patient presents with  . Shortness of Breath    (Consider location/radiation/quality/duration/timing/severity/associated sxs/prior treatment) HPI  51 y.o. female INAD c/o SOB and cough productive of clear phlegm. x7 days denies Fever, CP, palpations, N/V, leg swelling.   Past Medical History  Diagnosis Date  . COPD (chronic obstructive pulmonary disease)     Past Surgical History  Procedure Date  . Abdominal hysterectomy   . Tonsillectomy   . Throat surgery   . Nose surgery     No family history on file.  History  Substance Use Topics  . Smoking status: Former Smoker    Quit date: 11/09/2011  . Smokeless tobacco: Never Used  . Alcohol Use: No    OB History    Grav Para Term Preterm Abortions TAB SAB Ect Mult Living                  Review of Systems  Constitutional: Negative for fever.  Respiratory: Positive for cough and shortness of breath.   Cardiovascular: Negative for chest pain.  Gastrointestinal: Negative for nausea, vomiting, abdominal pain and diarrhea.  All other systems reviewed and are negative.    Allergies  Review of patient's allergies indicates no known allergies.  Home Medications   Current Outpatient Rx  Name Route Sig Dispense Refill  . ALBUTEROL SULFATE HFA 108 (90 BASE) MCG/ACT IN AERS Inhalation Inhale 1-2 puffs into the lungs every 6 (six) hours as needed for wheezing. 1 Inhaler 0  . FLUTICASONE-SALMETEROL 250-50 MCG/DOSE IN AEPB Inhalation Inhale 1 puff into the lungs 2 (two) times daily. 60 each 0  . PREDNISONE 10 MG PO TABS  Take 6 tabs on day one, take 5 tabs on day 2, take 4 tabs on day 3, take 3 tabs on day 4, take 2 tablets on day 5, take 1 tab on day 6. 21 tablet 0  . PREDNISONE 10 MG PO TABS Oral Take 2 tablets (20 mg total) by mouth daily. 10 tablet 0    BP 153/95  Pulse 110   Temp 98.7 F (37.1 C) (Oral)  Resp 18  SpO2 96%  Physical Exam  Nursing note and vitals reviewed. Constitutional: She is oriented to person, place, and time. She appears well-developed and well-nourished. No distress.  HENT:  Head: Normocephalic and atraumatic.  Mouth/Throat: Oropharynx is clear and moist.  Eyes: Conjunctivae normal and EOM are normal. Pupils are equal, round, and reactive to light.  Neck: Normal range of motion.  Cardiovascular: Normal rate, regular rhythm, normal heart sounds and intact distal pulses.   Pulmonary/Chest: Effort normal and breath sounds normal. No stridor. No respiratory distress. She has no wheezes. She has no rales. She exhibits no tenderness.  Abdominal: Soft. Bowel sounds are normal.  Musculoskeletal: Normal range of motion.  Neurological: She is alert and oriented to person, place, and time.  Psychiatric: She has a normal mood and affect.    ED Course  Procedures (including critical care time)  Labs Reviewed  CBC WITH DIFFERENTIAL - Abnormal; Notable for the following:    Eosinophils Relative 7 (*)     All other components within normal limits  BASIC METABOLIC PANEL  POCT I-STAT TROPONIN I   Dg Chest 2 View  08/12/2012  *RADIOLOGY REPORT*  Clinical Data: Short of breath.  Dizziness.  CHEST -  2 VIEW  Comparison: 08/06/2012 and multiple previous  Findings: Artifact overlies chest.  Heart size is normal.  There is mild central bronchial thickening but no infiltrate, collapse or effusion.  No bony abnormality.  IMPRESSION: Bronchial thickening.  Otherwise no active disease.   Original Report Authenticated By: Thomasenia Sales, M.D.      1. Bronchitis       MDM   Patient is significantly improved after 1 duo neb treatment. Lung sounds are clear it is more large airway wheezing. Chest x-ray shows no infiltrate but did show bronchial thickening. We'll treat her for bronchitis and also give her a short course of steroids.        Wynetta Emery, PA-C 08/12/12 2012

## 2012-08-12 NOTE — ED Notes (Signed)
Pt presents w/ shortness of breath worsening since Thursday. T&R here 9/29 w/ minimal relief. Denies fever, cough is productive and clear.

## 2012-08-12 NOTE — ED Notes (Signed)
Pt came in last week with SOB. Came back today with same symptoms but more severe. SOB and wheezing. Right side wheezing worse than left. No pain. Dry non productive cough. 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 Pt feeling lethargic when normally having to take tylenol PM to get to sleep. Pt been sleeping a lot for 3 days.

## 2012-08-13 NOTE — ED Provider Notes (Signed)
History/physical exam/procedure(s) were performed by non-physician practitioner and as supervising physician I was immediately available for consultation/collaboration. I have reviewed all notes and am in agreement with care and plan.   Hilario Quarry, MD 08/13/12 1758

## 2012-08-24 ENCOUNTER — Emergency Department (HOSPITAL_COMMUNITY)
Admission: EM | Admit: 2012-08-24 | Discharge: 2012-08-24 | Disposition: A | Discharge status: Home / Self Care | Payer: Self-pay | Attending: Emergency Medicine | Admitting: Emergency Medicine

## 2012-08-24 ENCOUNTER — Encounter (HOSPITAL_COMMUNITY): Payer: Self-pay

## 2012-08-24 DIAGNOSIS — Z79899 Other long term (current) drug therapy: Secondary | ICD-10-CM | POA: Insufficient documentation

## 2012-08-24 DIAGNOSIS — J441 Chronic obstructive pulmonary disease with (acute) exacerbation: Secondary | ICD-10-CM | POA: Insufficient documentation

## 2012-08-24 DIAGNOSIS — Z87891 Personal history of nicotine dependence: Secondary | ICD-10-CM | POA: Insufficient documentation

## 2012-08-24 MED ORDER — PREDNISONE 50 MG PO TABS: 50.0000 mg | ORAL_TABLET | Freq: Every day | ORAL | Status: DC

## 2012-08-24 MED ORDER — ALBUTEROL SULFATE (5 MG/ML) 0.5% IN NEBU
5.0000 mg | INHALATION_SOLUTION | RESPIRATORY_TRACT | Status: AC
  Administered 2012-08-24 (×3): 5 mg via RESPIRATORY_TRACT
  Filled 2012-08-24 (×3): qty 1

## 2012-08-24 MED ORDER — ALBUTEROL SULFATE HFA 108 (90 BASE) MCG/ACT IN AERS
1.0000 | INHALATION_SPRAY | Freq: Once | RESPIRATORY_TRACT | Status: AC
  Administered 2012-08-24: 1 via RESPIRATORY_TRACT
  Filled 2012-08-24: qty 6.7

## 2012-08-24 MED ORDER — IPRATROPIUM BROMIDE 0.02 % IN SOLN
0.5000 mg | Freq: Once | RESPIRATORY_TRACT | Status: AC
  Administered 2012-08-24: 0.5 mg via RESPIRATORY_TRACT
  Filled 2012-08-24: qty 2.5

## 2012-08-24 MED ORDER — PREDNISONE 20 MG PO TABS
60.0000 mg | ORAL_TABLET | Freq: Once | ORAL | Status: AC
  Administered 2012-08-24: 60 mg via ORAL
  Filled 2012-08-24: qty 3

## 2012-08-24 NOTE — ED Provider Notes (Addendum)
History     CSN: [id number]  Arrival date & time 08/24/12  0708   First MD Initiated Contact with Patient 08/24/12 [phone]      Chief Complaint  Patient presents with  . Shortness of Breath    (Consider location/radiation/quality/duration/timing/severity/associated sxs/prior treatment) HPI Comments: 51 y/o female with cc of sob. Pt has hx of COPD, usual triggers are weather. Pt has been having some DIB, and wheezing over the past 2-3 weeks now, seen in the ED recently and is s/o steroids and antibiotics. Her sx responded for a while, and she got better, only to start wheezing again a few days back. She occasionally coughs, which is not new. No fevers, chills. 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 No treatments taken at home, as she is out of her meds.  Patient is a 51 y.o. female presenting with shortness of breath. The history is provided by the patient and medical records.  Shortness of Breath  Associated symptoms include shortness of breath and wheezing. Pertinent negatives include no chest pain.    Past Medical History  Diagnosis Date  . COPD (chronic obstructive pulmonary disease)     Past Surgical History  Procedure Date  . Abdominal hysterectomy   . Tonsillectomy   . Throat surgery   . Nose surgery     History reviewed. No pertinent family history.  History  Substance Use Topics  . Smoking status: Former Smoker    Quit date: 11/09/2011  . Smokeless tobacco: Never Used  . Alcohol Use: No    OB History    Grav Para Term Preterm Abortions TAB SAB Ect Mult Living                  Review of Systems  Constitutional: Negative for activity change.  HENT: Negative for neck pain.   Respiratory: Positive for shortness of breath and wheezing.   Cardiovascular: Negative for chest pain.  Gastrointestinal: Negative for nausea, vomiting and abdominal pain.  Genitourinary: Negative for dysuria.  Neurological: Negative for headaches.    Allergies    Review of patient's allergies indicates no known allergies.  Home Medications   Current Outpatient Rx  Name Route Sig Dispense Refill  . ALBUTEROL SULFATE HFA 108 (90 BASE) MCG/ACT IN AERS Inhalation Inhale 2 puffs into the lungs every 6 (six) hours as needed.    . AZITHROMYCIN 250 MG PO TABS Oral Take 250 mg by mouth daily. 500mg  PO day 1, then 250mg  PO days 205    . DIPHENHYDRAMINE HCL 25 MG PO CAPS Oral Take 25 mg by mouth every 6 (six) hours as needed. Allergies.    Marland Kitchen PAROXETINE HCL 20 MG PO TABS Oral Take 20 mg by mouth every morning.    Marland Kitchen PREDNISONE 20 MG PO TABS Oral Take 40 mg by mouth daily. Take for five days      BP 147/110  Pulse 93  Temp 98.3 F (36.8 C) (Oral)  Resp 20  SpO2 97%  Physical Exam  Nursing note and vitals reviewed. Constitutional: She is oriented to person, place, and time. She appears well-developed and well-nourished.  HENT:  Head: Normocephalic and atraumatic.  Eyes: EOM are normal. Pupils are equal, round, and reactive to light.  Neck: Neck supple. No JVD present.  Cardiovascular: Normal rate, regular rhythm and normal heart sounds.   No murmur heard. Pulmonary/Chest: Effort normal. No respiratory distress. She has wheezes. She has no rales. She exhibits no tenderness.  Poor air wentry, with diffuse wheezing  Abdominal: Soft. She exhibits no distension. There is no tenderness. There is no rebound and no guarding.  Neurological: She is alert and oriented to person, place, and time.  Skin: Skin is warm and dry.    ED Course  Procedures (including critical care time)  Labs Reviewed - No data to display No results found.   No diagnosis found.    MDM  Pt with hx of COPD comes in with whezing and dib. Hx consistent with previous COPD exacerbation and exam suggestive of the same. There is no new cough, no fevers - no CXR ordered. Pt advised to stop smoking. Will get duo nebx w/ albuterol x 3, steroids, reassess.   Derwood Kaplan,  MD 08/24/12 0758  9:07 AM Improved exam post 3 treatments, pt feeling better. Will d/c. Inhaler provided.  Derwood Kaplan, MD 08/24/12 [phone]

## 2012-08-24 NOTE — ED Notes (Signed)
Per pt, shortness of breath x 2 weeks.  Pt has been in twice for same.  Pt with hx copd.  No oxygen at home.  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  Thinks could be contributing to sx.

## 2012-08-24 NOTE — ED Notes (Signed)
Pt sts she has ran out of PRN and maintenance home medication.  Pt denies home O2.  Sts she "just doesn't have any energy."  Pt not in acute respiratory distress.

## 2013-01-17 ENCOUNTER — Emergency Department (HOSPITAL_COMMUNITY)
Admission: EM | Admit: 2013-01-17 | Discharge: 2013-01-18 | Discharge status: Against Medical Advice | Payer: Self-pay | Attending: Emergency Medicine | Admitting: Emergency Medicine

## 2013-01-17 ENCOUNTER — Encounter (HOSPITAL_COMMUNITY): Payer: Self-pay | Admitting: *Deleted

## 2013-01-17 DIAGNOSIS — R05 Cough: Secondary | ICD-10-CM | POA: Insufficient documentation

## 2013-01-17 DIAGNOSIS — R059 Cough, unspecified: Secondary | ICD-10-CM | POA: Insufficient documentation

## 2013-01-17 DIAGNOSIS — F17200 Nicotine dependence, unspecified, uncomplicated: Secondary | ICD-10-CM | POA: Insufficient documentation

## 2013-01-17 DIAGNOSIS — J441 Chronic obstructive pulmonary disease with (acute) exacerbation: Secondary | ICD-10-CM | POA: Insufficient documentation

## 2013-01-17 MED ORDER — ALBUTEROL SULFATE (5 MG/ML) 0.5% IN NEBU
5.0000 mg | INHALATION_SOLUTION | Freq: Once | RESPIRATORY_TRACT | Status: AC
  Administered 2013-01-17: 5 mg via RESPIRATORY_TRACT
  Filled 2013-01-17: qty 1

## 2013-01-17 NOTE — ED Notes (Signed)
Pt reports worsening sob r/t copd in last few days. Worse with weather changes she feels. Productive cough with clear mucus. Has used proair inhaler x3 today without relief.

## 2013-01-18 ENCOUNTER — Inpatient Hospital Stay (HOSPITAL_COMMUNITY)
Admission: EM | Admit: 2013-01-18 | Discharge: 2013-01-20 | DRG: 192 | Disposition: A | Discharge status: Home / Self Care | Payer: PRIVATE HEALTH INSURANCE | Attending: Internal Medicine | Admitting: Internal Medicine

## 2013-01-18 ENCOUNTER — Encounter (HOSPITAL_COMMUNITY): Payer: Self-pay | Admitting: *Deleted

## 2013-01-18 ENCOUNTER — Emergency Department (HOSPITAL_COMMUNITY): Payer: Self-pay

## 2013-01-18 DIAGNOSIS — Z9089 Acquired absence of other organs: Secondary | ICD-10-CM

## 2013-01-18 DIAGNOSIS — F32A Depression, unspecified: Secondary | ICD-10-CM

## 2013-01-18 DIAGNOSIS — Z720 Tobacco use: Secondary | ICD-10-CM

## 2013-01-18 DIAGNOSIS — E860 Dehydration: Secondary | ICD-10-CM | POA: Diagnosis present

## 2013-01-18 DIAGNOSIS — F3289 Other specified depressive episodes: Secondary | ICD-10-CM | POA: Diagnosis present

## 2013-01-18 DIAGNOSIS — Z23 Encounter for immunization: Secondary | ICD-10-CM

## 2013-01-18 DIAGNOSIS — F329 Major depressive disorder, single episode, unspecified: Secondary | ICD-10-CM

## 2013-01-18 DIAGNOSIS — F17200 Nicotine dependence, unspecified, uncomplicated: Secondary | ICD-10-CM | POA: Diagnosis present

## 2013-01-18 DIAGNOSIS — R000 Tachycardia, unspecified: Secondary | ICD-10-CM | POA: Diagnosis present

## 2013-01-18 DIAGNOSIS — J441 Chronic obstructive pulmonary disease with (acute) exacerbation: Principal | ICD-10-CM | POA: Diagnosis present

## 2013-01-18 DIAGNOSIS — Z79899 Other long term (current) drug therapy: Secondary | ICD-10-CM

## 2013-01-18 DIAGNOSIS — Z791 Long term (current) use of non-steroidal anti-inflammatories (NSAID): Secondary | ICD-10-CM

## 2013-01-18 DIAGNOSIS — E876 Hypokalemia: Secondary | ICD-10-CM | POA: Diagnosis present

## 2013-01-18 DIAGNOSIS — Z90710 Acquired absence of both cervix and uterus: Secondary | ICD-10-CM

## 2013-01-18 HISTORY — DX: Major depressive disorder, single episode, unspecified: F32.9

## 2013-01-18 HISTORY — DX: Headache: R51

## 2013-01-18 HISTORY — DX: Tobacco use: Z72.0

## 2013-01-18 HISTORY — DX: Depression, unspecified: F32.A

## 2013-01-18 LAB — CBC
MCH: 29.5 pg (ref 26.0–34.0)
MCHC: 32.5 g/dL (ref 30.0–36.0)
MCV: 90.7 fL (ref 78.0–100.0)
Platelets: 233 10*3/uL (ref 150–400)
RBC: 4.31 MIL/uL (ref 3.87–5.11)

## 2013-01-18 LAB — HEPATIC FUNCTION PANEL
AST: 18 U/L (ref 0–37)
Albumin: 3.8 g/dL (ref 3.5–5.2)
Alkaline Phosphatase: 99 U/L (ref 39–117)
Total Protein: 7.2 g/dL (ref 6.0–8.3)

## 2013-01-18 LAB — URINALYSIS, ROUTINE W REFLEX MICROSCOPIC
Bilirubin Urine: NEGATIVE
Glucose, UA: NEGATIVE mg/dL
Hgb urine dipstick: NEGATIVE
Ketones, ur: NEGATIVE mg/dL
Leukocytes, UA: NEGATIVE
Nitrite: NEGATIVE
Protein, ur: NEGATIVE mg/dL
Specific Gravity, Urine: 1.037 — ABNORMAL HIGH (ref 1.005–1.030)
Urobilinogen, UA: 0.2 mg/dL (ref 0.0–1.0)
pH: 5 (ref 5.0–8.0)

## 2013-01-18 LAB — POCT I-STAT, CHEM 8
BUN: 15 mg/dL (ref 6–23)
Calcium, Ion: 1.15 mmol/L (ref 1.12–1.23)
Chloride: 110 mEq/L (ref 96–112)
Glucose, Bld: 132 mg/dL — ABNORMAL HIGH (ref 70–99)
HCT: 40 % (ref 36.0–46.0)

## 2013-01-18 LAB — CREATININE, SERUM
Creatinine, Ser: 0.52 mg/dL (ref 0.50–1.10)
GFR calc non Af Amer: >90 mL/min (ref >90)

## 2013-01-18 LAB — MAGNESIUM: Magnesium: 1.7 mg/dL (ref 1.5–2.5)

## 2013-01-18 LAB — TSH: TSH: 1.081 u[IU]/mL (ref 0.350–4.500)

## 2013-01-18 MED ORDER — POTASSIUM CHLORIDE CRYS ER 20 MEQ PO TBCR
40.0000 meq | EXTENDED_RELEASE_TABLET | Freq: Once | ORAL | Status: AC
  Administered 2013-01-18: 40 meq via ORAL
  Filled 2013-01-18: qty 2

## 2013-01-18 MED ORDER — PANTOPRAZOLE SODIUM 40 MG PO TBEC
40.0000 mg | DELAYED_RELEASE_TABLET | Freq: Every day | ORAL | Status: DC
  Administered 2013-01-18 – 2013-01-20 (×3): 40 mg via ORAL
  Filled 2013-01-18 (×4): qty 1

## 2013-01-18 MED ORDER — IPRATROPIUM BROMIDE 0.02 % IN SOLN
0.5000 mg | RESPIRATORY_TRACT | Status: DC | PRN
  Filled 2013-01-18: qty 2.5

## 2013-01-18 MED ORDER — LEVALBUTEROL HCL 0.63 MG/3ML IN NEBU
0.6300 mg | INHALATION_SOLUTION | RESPIRATORY_TRACT | Status: DC | PRN
  Filled 2013-01-18: qty 3

## 2013-01-18 MED ORDER — PREDNISONE 20 MG PO TABS
60.0000 mg | ORAL_TABLET | Freq: Once | ORAL | Status: AC
  Administered 2013-01-18: 60 mg via ORAL
  Filled 2013-01-18: qty 3

## 2013-01-18 MED ORDER — ALBUTEROL SULFATE (5 MG/ML) 0.5% IN NEBU: 2.5000 mg | INHALATION_SOLUTION | RESPIRATORY_TRACT | Status: DC | PRN

## 2013-01-18 MED ORDER — SODIUM CHLORIDE 0.9 % IV SOLN
INTRAVENOUS | Status: AC
  Administered 2013-01-18 – 2013-01-19 (×3): via INTRAVENOUS

## 2013-01-18 MED ORDER — NICOTINE 14 MG/24HR TD PT24
14.0000 mg | MEDICATED_PATCH | Freq: Every day | TRANSDERMAL | Status: DC
  Administered 2013-01-18 – 2013-01-20 (×3): 14 mg via TRANSDERMAL
  Filled 2013-01-18 (×3): qty 1

## 2013-01-18 MED ORDER — INFLUENZA VIRUS VACC SPLIT PF IM SUSP
0.5000 mL | INTRAMUSCULAR | Status: AC
  Administered 2013-01-19: 0.5 mL via INTRAMUSCULAR
  Filled 2013-01-18 (×2): qty 0.5

## 2013-01-18 MED ORDER — ONDANSETRON HCL 4 MG PO TABS: 8.0000 mg | ORAL_TABLET | Freq: Four times a day (QID) | ORAL | Status: DC | PRN

## 2013-01-18 MED ORDER — AZITHROMYCIN 250 MG PO TABS
500.0000 mg | ORAL_TABLET | Freq: Once | ORAL | Status: AC
Start: 2013-01-18 — End: 2013-01-18
  Administered 2013-01-18: 500 mg via ORAL
  Filled 2013-01-18: qty 2

## 2013-01-18 MED ORDER — SORBITOL 70 % SOLN
30.0000 mL | Freq: Every day | Status: DC | PRN
  Filled 2013-01-18: qty 30

## 2013-01-18 MED ORDER — ACETAMINOPHEN 650 MG RE SUPP: 650.0000 mg | Freq: Four times a day (QID) | RECTAL | Status: DC | PRN

## 2013-01-18 MED ORDER — PAROXETINE HCL 20 MG PO TABS
20.0000 mg | ORAL_TABLET | Freq: Every day | ORAL | Status: DC
  Administered 2013-01-18 – 2013-01-20 (×3): 20 mg via ORAL
  Filled 2013-01-18 (×3): qty 1

## 2013-01-18 MED ORDER — ALUM & MAG HYDROXIDE-SIMETH 200-200-20 MG/5ML PO SUSP: 30.0000 mL | Freq: Four times a day (QID) | ORAL | Status: DC | PRN

## 2013-01-18 MED ORDER — IPRATROPIUM BROMIDE 0.02 % IN SOLN
0.5000 mg | Freq: Four times a day (QID) | RESPIRATORY_TRACT | Status: DC
  Administered 2013-01-18 – 2013-01-20 (×8): 0.5 mg via RESPIRATORY_TRACT
  Filled 2013-01-18 (×10): qty 2.5

## 2013-01-18 MED ORDER — MORPHINE SULFATE 2 MG/ML IJ SOLN
1.0000 mg | INTRAMUSCULAR | Status: DC | PRN
  Administered 2013-01-18: 1 mg via INTRAVENOUS
  Filled 2013-01-18 (×2): qty 1

## 2013-01-18 MED ORDER — LEVALBUTEROL HCL 0.63 MG/3ML IN NEBU
0.6300 mg | INHALATION_SOLUTION | Freq: Four times a day (QID) | RESPIRATORY_TRACT | Status: DC
  Administered 2013-01-18 – 2013-01-20 (×8): 0.63 mg via RESPIRATORY_TRACT
  Filled 2013-01-18 (×12): qty 3

## 2013-01-18 MED ORDER — ALBUTEROL SULFATE (5 MG/ML) 0.5% IN NEBU
5.0000 mg | INHALATION_SOLUTION | Freq: Once | RESPIRATORY_TRACT | Status: AC
  Administered 2013-01-18: 5 mg via RESPIRATORY_TRACT
  Filled 2013-01-18: qty 1

## 2013-01-18 MED ORDER — ONDANSETRON HCL 4 MG/2ML IJ SOLN
4.0000 mg | Freq: Four times a day (QID) | INTRAMUSCULAR | Status: DC | PRN
  Administered 2013-01-18: 4 mg via INTRAVENOUS
  Filled 2013-01-18: qty 2

## 2013-01-18 MED ORDER — OXYCODONE HCL 5 MG PO TABS
5.0000 mg | ORAL_TABLET | ORAL | Status: DC | PRN
  Administered 2013-01-18: 5 mg via ORAL
  Filled 2013-01-18: qty 1

## 2013-01-18 MED ORDER — SODIUM CHLORIDE 0.9 % IJ SOLN
3.0000 mL | Freq: Two times a day (BID) | INTRAMUSCULAR | Status: DC
  Administered 2013-01-18 – 2013-01-20 (×3): 3 mL via INTRAVENOUS

## 2013-01-18 MED ORDER — METHYLPREDNISOLONE SODIUM SUCC 125 MG IJ SOLR
60.0000 mg | Freq: Two times a day (BID) | INTRAMUSCULAR | Status: DC
  Administered 2013-01-18 – 2013-01-19 (×4): 60 mg via INTRAVENOUS
  Filled 2013-01-18 (×6): qty 0.96

## 2013-01-18 MED ORDER — FLEET ENEMA 7-19 GM/118ML RE ENEM: 1.0000 | ENEMA | Freq: Once | RECTAL | Status: AC | PRN

## 2013-01-18 MED ORDER — ONDANSETRON HCL 4 MG/2ML IJ SOLN: 4.0000 mg | Freq: Three times a day (TID) | INTRAMUSCULAR | Status: DC | PRN

## 2013-01-18 MED ORDER — ENOXAPARIN SODIUM 40 MG/0.4ML ~~LOC~~ SOLN
40.0000 mg | SUBCUTANEOUS | Status: DC
  Administered 2013-01-18 – 2013-01-20 (×3): 40 mg via SUBCUTANEOUS
  Filled 2013-01-18 (×3): qty 0.4

## 2013-01-18 MED ORDER — ACETAMINOPHEN 325 MG PO TABS
650.0000 mg | ORAL_TABLET | Freq: Four times a day (QID) | ORAL | Status: DC | PRN
  Administered 2013-01-18: 650 mg via ORAL
  Filled 2013-01-18: qty 2

## 2013-01-18 MED ORDER — LEVOFLOXACIN IN D5W 500 MG/100ML IV SOLN
500.0000 mg | INTRAVENOUS | Status: DC
  Administered 2013-01-18: 500 mg via INTRAVENOUS
  Filled 2013-01-18 (×2): qty 100

## 2013-01-18 MED ORDER — ONDANSETRON 8 MG/NS 50 ML IVPB
8.0000 mg | Freq: Four times a day (QID) | INTRAVENOUS | Status: DC | PRN
  Administered 2013-01-18: 8 mg via INTRAVENOUS
  Filled 2013-01-18: qty 8

## 2013-01-18 MED ORDER — ONDANSETRON HCL 4 MG PO TABS: 4.0000 mg | ORAL_TABLET | Freq: Four times a day (QID) | ORAL | Status: DC | PRN

## 2013-01-18 MED ORDER — MOMETASONE FURO-FORMOTEROL FUM 100-5 MCG/ACT IN AERO
2.0000 | INHALATION_SPRAY | Freq: Two times a day (BID) | RESPIRATORY_TRACT | Status: DC
  Administered 2013-01-18 – 2013-01-20 (×4): 2 via RESPIRATORY_TRACT
  Filled 2013-01-18: qty 8.8

## 2013-01-18 MED ORDER — IPRATROPIUM BROMIDE 0.02 % IN SOLN
0.5000 mg | Freq: Once | RESPIRATORY_TRACT | Status: AC
  Administered 2013-01-18: 0.5 mg via RESPIRATORY_TRACT
  Filled 2013-01-18: qty 2.5

## 2013-01-18 MED ORDER — ALBUTEROL (5 MG/ML) CONTINUOUS INHALATION SOLN
10.0000 mg/h | INHALATION_SOLUTION | Freq: Once | RESPIRATORY_TRACT | Status: AC
  Administered 2013-01-18: 10 mg/h via RESPIRATORY_TRACT
  Filled 2013-01-18: qty 20

## 2013-01-18 MED ORDER — POLYETHYLENE GLYCOL 3350 17 G PO PACK
17.0000 g | PACK | Freq: Every day | ORAL | Status: DC | PRN
  Filled 2013-01-18: qty 1

## 2013-01-18 NOTE — ED Provider Notes (Signed)
History     CSN: [id number]  Arrival date & time 01/18/13  4098   First MD Initiated Contact with Patient 01/18/13 [phone]      Chief Complaint  Patient presents with  . Shortness of Breath    (Consider location/radiation/quality/duration/timing/severity/associated sxs/prior treatment) HPI Lori Owens is a 52 y.o. female who presents to ED with complaint of shortness of breath, cough, wheezing. States has hx of COPD, she is an x-smoker. States trouble breathing began about a week ago. States has been using albuterol inhaler in the last few days with no relief. States ran out of advair, statse was unable to refill due to no insurance, but should be able to get it today. Denies chest pain. States this feels like her prior COPD exacerbations. States no fever ,chills, malaise. No n/v/d or abdominal pain.    Past Medical History  Diagnosis Date  . COPD (chronic obstructive pulmonary disease)     Past Surgical History  Procedure Laterality Date  . Abdominal hysterectomy    . Tonsillectomy    . Throat surgery    . Nose surgery      No family history on file.  History  Substance Use Topics  . Smoking status: Current Every Day Smoker -- 0.50 packs/day  . Smokeless tobacco: Never Used  . Alcohol Use: No    OB History   Grav Para Term Preterm Abortions TAB SAB Ect Mult Living                  Review of Systems  Constitutional: Negative for fever and chills.  HENT: Negative for neck pain and neck stiffness.   Respiratory: Positive for cough, chest tightness, shortness of breath and wheezing.   Cardiovascular: Negative.   Gastrointestinal: Negative.   Genitourinary: Negative for dysuria and flank pain.  All other systems reviewed and are negative.    Allergies  Review of patient's allergies indicates no known allergies.  Home Medications   Current Outpatient Rx  Name  Route  Sig  Dispense  Refill  . ibuprofen (ADVIL,MOTRIN) 200 MG tablet   Oral   Take 200-400 mg  by mouth every 6 (six) hours as needed for pain.          Marland Kitchen PARoxetine (PAXIL) 20 MG tablet   Oral   Take 20 mg by mouth every morning.         Marland Kitchen EXPIRED: albuterol (PROVENTIL HFA;VENTOLIN HFA) 108 (90 BASE) MCG/ACT inhaler   Inhalation   Inhale 2 puffs into the lungs every 6 (six) hours as needed.           BP 142/94  Pulse 90  Temp(Src) 98.4 F (36.9 C) (Oral)  Resp 20  SpO2 95%  Physical Exam  Nursing note and vitals reviewed. Constitutional: She appears well-developed and well-nourished. No distress.  HENT:  Head: Normocephalic.  Eyes: Conjunctivae are normal.  Neck: Neck supple.  Cardiovascular: Normal rate, regular rhythm and normal heart sounds.   Pulmonary/Chest: Effort normal. No respiratory distress. She has wheezes. She has no rales.  Prolonged expirations, expiratory wheezes in all lung fields  Abdominal: Soft. Bowel sounds are normal. She exhibits no distension. There is no tenderness.  Musculoskeletal: She exhibits no edema.  Neurological: She is alert.  Skin: Skin is warm and dry.    ED Course  Procedures (including critical care time)   Date: 01/18/2013  Rate: 76  Rhythm: normal sinus rhythm  QRS Axis: normal  Intervals: normal  ST/T Wave abnormalities: normal  Conduction Disutrbances:none  Narrative Interpretation:   Old EKG Reviewed: none available   Pt received a neb here in ED, with no improvement. Will try hour long.   Pt reassessed after hour long neb. Feeling better. Oxygen sat is 90% on RA. Pt continues to have inspiratory and expiratory wheezes. CXR as below. Will try few more breathing treatments. Pt received prednisone and lavaquin PO.   Dg Chest 2 View  01/18/2013  *RADIOLOGY REPORT*  Clinical Data: Shortness of breath  CHEST - 2 VIEW  Comparison: 08/12/2012  Findings: Interstitial prominence / peribronchial thickening.  No confluent airspace opacity.  No pleural effusion or pneumothorax. Cardiomediastinal contours within normal  range.  No acute osseous finding.  The  IMPRESSION: Interstitial prominence / peribronchial thickening may be chronic versus atypical/viral infection.  No confluent airspace opacity.   Original Report Authenticated By: Jearld Lesch, M.D.    6:00 AM Pt singed out at shift change with PA Harris, if not improving on nebs, admit. If able to maintain sats and feeling better, follow up with pcp, steroids, antibiotic at home.      MDM         Lottie Mussel, PA-C 01/18/13 2218

## 2013-01-18 NOTE — H&P (Signed)
[facility] History and Physical  Lori Owens WGN:[id number] DOB: 1961/08/19 DOA: 01/18/2013  Referring physician: Arthor Captain, PA PCP: No primary Lori Owens on file.  Specialists:   Chief Complaint: Worsening shortness of breath and wheezing.  HPI: Lori Owens is a 52 y.o. female with history of COPD with ongoing tobacco abuse, depression presents to the ED with a 2 to three-day history of worsening shortness of breath, wheezing, productive cough of clear sputum, generalized weakness. Patient states run out of her Advair greater than a month ago. Patient said that she tried her pleural air rescue inhaler with no significant improvement. Patient denies any fever, no chills, no chest pain, no nausea, no vomiting, no abdominal pain, no dysuria. Patient had presented to the ED one day prior to admission was given some nebulizer treatment and stated had to leave for some family issues. Patient returns back on the day of admission with no significant improvement in his symptoms. In the ED patient was given a nebulizer dose as well as another dose of continuous nebulizers. Patient's oxygen was removed and patient was ambulated down the hallway with sats of 89% on ambulation.  Chest x-ray done was negative for any acute infiltrate. Basic metabolic profile done showed a potassium of 3.4 otherwise was within normal limits. CBC done was unremarkable.   Review of Systems: The patient denies anorexia, fever, weight loss,, vision loss, decreased hearing, hoarseness, chest pain, syncope, dyspnea on exertion, peripheral edema, balance deficits, hemoptysis, abdominal pain, melena, hematochezia, severe indigestion/heartburn, hematuria, incontinence, genital sores, muscle weakness, suspicious skin lesions, transient blindness, difficulty walking, depression, unusual weight change, abnormal bleeding, enlarged lymph nodes, angioedema, and breast masses.   Past Medical History  Diagnosis Date  . COPD  (chronic obstructive pulmonary disease)   . Depression 01/18/2013  . Tobacco abuse 01/18/2013   Past Surgical History  Procedure Laterality Date  . Abdominal hysterectomy    . Tonsillectomy    . Throat surgery    . Nose surgery     Social History:  reports that she has been smoking.  She has never used smokeless tobacco. She reports that she does not drink alcohol or use illicit drugs.  No Known Allergies  No family history on file.  Prior to Admission medications   Medication Sig Start Date End Date Taking? Authorizing Lori Owens  ibuprofen (ADVIL,MOTRIN) 200 MG tablet Take 200-400 mg by mouth every 6 (six) hours as needed for pain.    Yes Historical Lori Pagan, MD  PARoxetine (PAXIL) 20 MG tablet Take 20 mg by mouth every morning.   Yes Historical Lori Loewen, MD  albuterol (PROVENTIL HFA;VENTOLIN HFA) 108 (90 BASE) MCG/ACT inhaler Inhale 2 puffs into the lungs every 6 (six) hours as needed. 11/21/11 11/20/12  Lori Zenaida Niece Wingen, PA-C  Fluticasone-Salmeterol (ADVAIR) 250-50 MCG/DOSE AEPB Inhale 1 puff into the lungs every 12 (twelve) hours.    Historical Lori Colgate, MD   Physical Exam: Filed Vitals:   01/18/13 0544 01/18/13 0614 01/18/13 0616 01/18/13 0820  BP:   123/89   Pulse:   99   Temp:   98.6 F (37 C)   TempSrc:   Oral   Resp:   18   SpO2: 91% 89% 90% 89%     General:  Well-developed well-nourished in no acute cardiopulmonary distress. Speaking in full sentences.  Eyes: Pupils equal round and reactive to light and accommodation. Extraocular movements intact.  ENT: Oropharynx is clear, no lesions, no exudates. Dry mucous membranes.  Neck: Supple with no lymphadenopathy.  Cardiovascular:  Regular rate rhythm no murmurs rubs or gallops. No JVD. No lower extremity edema.  Respiratory: Mild expiratory wheezing. No crackles, no rhonchi.  Abdomen: Soft, nontender, nondistended, positive bowel sounds.   Skin: No rashes or lesions.  Musculoskeletal: 5/5 BUE strength, 5/5 ble  STRENGTH  Psychiatric: Normal mood. Normal affect. Fair Insight. Fair judgment.  Neurologic: Alert and oriented x3. Cranial nerves II through XII are grossly intact. No focal deficits.  Labs on Admission:  Basic Metabolic Panel:  Recent Labs Lab 01/18/13 0915  NA 143  K 3.4*  CL 110  GLUCOSE 132*  BUN 15  CREATININE 0.60   Liver Function Tests: No results found for this basename: AST, ALT, ALKPHOS, BILITOT, PROT, ALBUMIN,  in the last 168 hours No results found for this basename: LIPASE, AMYLASE,  in the last 168 hours No results found for this basename: AMMONIA,  in the last 168 hours CBC:  Recent Labs Lab 01/18/13 0910 01/18/13 0915  WBC 4.2  --   HGB 12.7 13.6  HCT 39.1 40.0  MCV 90.7  --   PLT 233  --    Cardiac Enzymes: No results found for this basename: CKTOTAL, CKMB, CKMBINDEX, TROPONINI,  in the last 168 hours  BNP (last 3 results) No results found for this basename: PROBNP,  in the last 8760 hours CBG: No results found for this basename: GLUCAP,  in the last 168 hours  Radiological Exams on Admission: Dg Chest 2 View  01/18/2013  *RADIOLOGY REPORT*  Clinical Data: Shortness of breath  CHEST - 2 VIEW  Comparison: 08/12/2012  Findings: Interstitial prominence / peribronchial thickening.  No confluent airspace opacity.  No pleural effusion or pneumothorax. Cardiomediastinal contours within normal range.  No acute osseous finding.  The  IMPRESSION: Interstitial prominence / peribronchial thickening may be chronic versus atypical/viral infection.  No confluent airspace opacity.   Original Report Authenticated By: Jearld Lesch, M.D.     EKG: Independently reviewed. Normal sinus rhythm  Assessment/Plan Principal Problem:   COPD with acute exacerbation Active Problems:   Hypokalemia   Dehydration   Depression   Tobacco abuse   Tachycardia   #1 acute COPD exacerbation Questionable etiology. Likely triggered by weather changes. Will admit patient to  telemetry floor. Will place on IV Solu-Medrol taper, IV Levaquin, nebulizer treatments. Follow.  #2 hypokalemia Likely secondary to increased albuterol use. Replete.  #3 dehydration IV fluids.  #4 depression Continue home regimen of Paxil.  #5 tachycardia Likely secondary to problem #1. Will check a TSH. Will change albuterol to Xopenex. IVF, Follow  #6 tobacco abuse Tobacco cessation. Will place on a nicotine patch.  #7 prophylaxis PPI for GI prophylaxis. Lovenox for DVT prophylaxis.  Code Status: Full Family Communication:  updated patient no family at bedside. Disposition Plan: Admit to telemetry  Time spent: 65 mins  [facility] Pager [phone]  If 7PM-7AM, please contact night-coverage [email or web address] Password [facility] 01/18/2013, 10:54 AM

## 2013-01-18 NOTE — ED Notes (Signed)
Respiratory called for ABG

## 2013-01-18 NOTE — ED Provider Notes (Signed)
Medical screening examination/treatment/procedure(s) were performed by non-physician practitioner and as supervising physician I was immediately available for consultation/collaboration.  Olivia Mackie, MD 01/18/13 [phone]

## 2013-01-18 NOTE — ED Notes (Signed)
Pt seen last night about 1800 for c/o increasing shortness of breath and asthma; given a breathing treatment then left AMA due to wait times; returns with same symptoms

## 2013-01-18 NOTE — Progress Notes (Signed)
PT Cancellation Note  Patient Details Name: Lori Owens MRN: [id number] DOB: 07/13/1961   Cancelled Treatment:    Reason Eval/Treat Not Completed: Medical issues which prohibited therapy Pt sitting EOB with pan on floor and requesting nausea meds.  RN notified.   [name],KATHrine E 01/18/2013, 3:33 PM Zenovia Jarred, PT, DPT 01/18/2013 Pager: [phone]

## 2013-01-18 NOTE — ED Provider Notes (Signed)
6:15AM Assumed care of the patient from PA Kirichenko.  Patient has a history of COPD.  Patient has been out of her Advair and albuterol for several days.  She's had worsening wheezing and shortness of breath.  The patient states that she also has a new onset cough which is different from her regular cough.  She has received several nebulizer treatments including an hour-long nebulizer with some fluctuation in her oxygen saturations.  Patient is currently undergoing another nebulizer at this time.  Will reevaluate shortly    7:54 AM Patient is feeling much better better oxygen saturations are 95%.  The patient does have diffuse mild expiratory wheeze with good air movement on physical exam.  I've asked the nursing staff to ambulate the patient with pulse ox to evaluate for any drop in her oxygen saturations.  If they stay above 90 I will release the patient with azithromycin, albuterol and short steroid taper to followup with pulmonology.  8:49 AM He said it really did in the hall.  Her oxygen saturations dropped to 89% with ambulation.  The patient complained of severe fatigue and weakness during this ambulatory period.  I feel that at this point with her multiple nebulizer treatments she should be admitted for observation.  Labs have been ordered and are currently pending.  Patient accepted by Dr. Ramiro Harvest for admission.   Arthor Captain, PA-C 01/18/13 1550

## 2013-01-18 NOTE — ED Notes (Signed)
Report to [facility]

## 2013-01-19 LAB — BASIC METABOLIC PANEL
CO2: 24 mEq/L (ref 19–32)
Chloride: 105 mEq/L (ref 96–112)
GFR calc Af Amer: >90 mL/min (ref >90)
Potassium: 4.3 mEq/L (ref 3.5–5.1)

## 2013-01-19 LAB — CBC
HCT: 39 % (ref 36.0–46.0)
Hemoglobin: 12.6 g/dL (ref 12.0–15.0)
MCV: 91.5 fL (ref 78.0–100.0)
WBC: 6.3 10*3/uL (ref 4.0–10.5)

## 2013-01-19 MED ORDER — GUAIFENESIN ER 600 MG PO TB12
1200.0000 mg | ORAL_TABLET | Freq: Two times a day (BID) | ORAL | Status: DC
  Administered 2013-01-19 – 2013-01-20 (×3): 1200 mg via ORAL
  Filled 2013-01-19 (×4): qty 2

## 2013-01-19 MED ORDER — DIPHENHYDRAMINE HCL 50 MG PO CAPS
50.0000 mg | ORAL_CAPSULE | Freq: Four times a day (QID) | ORAL | Status: DC | PRN
  Administered 2013-01-19 (×2): 50 mg via ORAL
  Filled 2013-01-19 (×2): qty 1

## 2013-01-19 MED ORDER — LEVOFLOXACIN 500 MG PO TABS
500.0000 mg | ORAL_TABLET | Freq: Every day | ORAL | Status: DC
  Administered 2013-01-19 – 2013-01-20 (×2): 500 mg via ORAL
  Filled 2013-01-19 (×2): qty 1

## 2013-01-19 NOTE — Progress Notes (Signed)
Pt c/o sinus headache.  Tylenol given prior pt states was ineffective. Oxycodone 5mg  given but ineffective.  Merdis Delay, NP notified. Order to give Morphine as already ordered prn. 0120  Pt refuses to take Morphine at this time, states Morphine has caused headache to get worse. Pt continues to complain of  sinus headache, Merdis Delay, NP notified. New order for Benadryl po.  Benadryl 50mg  po given as ordered. Will continue to monitor. Newman Nip Ali Molina

## 2013-01-19 NOTE — Progress Notes (Signed)
Spoke with PT.  No OT needs for this pt. Thanks, Lise Auer, OT

## 2013-01-19 NOTE — Evaluation (Deleted)
Physical Therapy Evaluation Patient Details Name: Lori Owens MRN: [id number] DOB: 23-Nov-1960 Today's Date: 01/19/2013 Time: 4098-1191 PT Time Calculation (min): 18 min  PT Assessment / Plan / Recommendation Clinical Impression  52 y.o. female admitted with COPD exacerbation and dehydration. Pt ambulated 275' independently without LOB. HR 114 at rest, 132 initially with walking, then 122 at slower pace. SaO2 92% on RA walking. No f/u PT nor DME needed. Will follow acutely.     PT Assessment  Patient needs continued PT services    Follow Up Recommendations  No PT follow up    Does the patient have the potential to tolerate intense rehabilitation      Barriers to Discharge None      Equipment Recommendations  None recommended by PT    Recommendations for Other Services     Frequency Min 3X/week    Precautions / Restrictions Precautions Precaution Comments: monitor HR Restrictions Weight Bearing Restrictions: No   Pertinent Vitals/Pain *HR 114 at rest, 132 initially with walking, then 122 with slower pace SaO2 92% on RA while walking**      Mobility  Bed Mobility Bed Mobility: Not assessed Transfers Transfers: Sit to Stand;Stand to Sit Sit to Stand: 7: Independent;From chair/3-in-1 Stand to Sit: 7: Independent;To chair/3-in-1 Ambulation/Gait Ambulation/Gait Assistance: 7: Independent Ambulation Distance (Feet): 275 Feet Assistive device: None Gait Pattern: Within Functional Limits General Gait Details: HR 114 at rest, 132 initially with walking, then 122 with slower pace; SaO2 92% on RA while walking    Exercises     PT Diagnosis: Difficulty walking  PT Problem List: Cardiopulmonary status limiting activity PT Treatment Interventions: Gait training   PT Goals Acute Rehab PT Goals PT Goal Formulation: With patient Time For Goal Achievement: 02/02/13 Potential to Achieve Goals: Good Pt will Ambulate: >150 feet;Independently;Other (comment) (with HR under  130) PT Goal: Ambulate - Progress: Goal set today Pt will Perform Home Exercise Program: Independently PT Goal: Perform Home Exercise Program - Progress: Goal set today  Visit Information  Last PT Received On: 01/19/13 Assistance Needed: +1    Subjective Data  Subjective: I stopped smoking yesterday. I have a lot of nervous energy. 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 Communication: No difficulties    Cognition  Cognition Overall Cognitive Status: Appears within functional limits for tasks assessed/performed Arousal/Alertness: Awake/alert Orientation Level: Appears intact for tasks assessed Behavior During Session: [facility] for tasks performed    Extremity/Trunk Assessment Right Upper Extremity Assessment RUE ROM/Strength/Tone: Within functional levels Left Upper Extremity Assessment LUE ROM/Strength/Tone: Within functional levels Right Lower Extremity Assessment RLE ROM/Strength/Tone: Within functional levels RLE Sensation: WFL - Light Touch RLE Coordination: WFL - gross/fine motor Left Lower Extremity Assessment LLE ROM/Strength/Tone: Within functional levels LLE Sensation: WFL - Light Touch LLE Coordination: WFL - gross/fine motor Trunk Assessment Trunk Assessment: Normal   Balance Balance Balance Assessed: Yes High Level Balance High Level Balance Comments: no LOB with walking  End of Session PT - End of Session Activity Tolerance: Patient tolerated treatment well Patient left: in chair;with call bell/phone within reach Nurse Communication:  Mobility status  GP  Ralene Bathe Kistler 01/19/2013, 10:29 AM  [phone]

## 2013-01-19 NOTE — Progress Notes (Signed)
[facility] PROGRESS NOTE  Lori Owens ZHY:[id number] DOB: 27-Nov-1960 DOA: 01/18/2013 PCP: No primary provider on file.  Assessment/Plan:  #1 acute COPD exacerbation Patient is improving slowly clinically. Will continue IV steroid taper and possibly switch to oral steroids tomorrow. Continue scheduled meds. Change IV Levaquin to oral Levaquin. We'll place on Mucinex. Follow.  #2 hypokalemia Repleted.  #3 sinus tachycardia Likely secondary to problem #1 and albuterol nebs. TSH is within normal limits. Heart rate is improved. Continue Xopenex nebs is that of albuterol. Supportive care. Continue treatment as in problem #1.  #4 depression Continue Paxil.  #5 dehydration IV fluids.  #6 tobacco abuse Tobacco cessation. Continue nicotine patch.  #7 prophylaxis PPI for GI prophylaxis. Lovenox for DVT prophylaxis.  Code Status: Full Family Communication: Updated patient no family present. Disposition Plan: Home when medically stable in 1-2 days.   Consultants:  None  Procedures:  Chest x-ray 01/18/2013  Antibiotics:  IV Levaquin 01/18/2013  HPI/Subjective: Patient states some with wheezing.  Objective: Filed Vitals:   01/18/13 2018 01/18/13 2121 01/19/13 0358 01/19/13 0543  BP:  154/100 158/94 163/93  Pulse:  106 112 114  Temp:  98.1 F (36.7 C)  98.1 F (36.7 C)  TempSrc:  Oral  Oral  Resp:  20  20  Height:      Weight:    89.359 kg (197 lb)  SpO2: 91% 95%  95%    Intake/Output Summary (Last 24 hours) at 01/19/13 0908 Last data filed at 01/19/13 0700  Gross per 24 hour  Intake 1987.5 ml  Output      0 ml  Net 1987.5 ml   Filed Weights   01/18/13 1122 01/19/13 0543  Weight: 88.4 kg (194 lb 14.2 oz) 89.359 kg (197 lb)    Exam:   General:  NAD  Cardiovascular: RRR  Respiratory: MILD EXP WHEEZING  Abdomen: Soft, nontender, nondistended, positive bowel sounds.  Extremities: No clubbing cyanosis or edema.  Data Reviewed: Basic  Metabolic Panel:  Recent Labs Lab 01/18/13 0910 01/18/13 0915 01/19/13 0443  NA  --  143 140  K  --  3.4* 4.3  CL  --  110 105  CO2  --   --  24  GLUCOSE  --  132* 175*  BUN  --  15 13  CREATININE 0.52 0.60 0.53  CALCIUM  --   --  9.3  MG 1.7  --   --    Liver Function Tests:  Recent Labs Lab 01/18/13 0910  AST 18  ALT 18  ALKPHOS 99  BILITOT 0.2*  PROT 7.2  ALBUMIN 3.8   No results found for this basename: LIPASE, AMYLASE,  in the last 168 hours No results found for this basename: AMMONIA,  in the last 168 hours CBC:  Recent Labs Lab 01/18/13 0910 01/18/13 0915 01/19/13 0443  WBC 4.2  --  6.3  HGB 12.7 13.6 12.6  HCT 39.1 40.0 39.0  MCV 90.7  --  91.5  PLT 233  --  258   Cardiac Enzymes: No results found for this basename: CKTOTAL, CKMB, CKMBINDEX, TROPONINI,  in the last 168 hours BNP (last 3 results) No results found for this basename: PROBNP,  in the last 8760 hours CBG: No results found for this basename: GLUCAP,  in the last 168 hours  No results found for this or any previous visit (from the past 240 hour(s)).   Studies: Dg Chest 2 View  01/18/2013  *RADIOLOGY REPORT*  Clinical Data: Shortness  of breath  CHEST - 2 VIEW  Comparison: 08/12/2012  Findings: Interstitial prominence / peribronchial thickening.  No confluent airspace opacity.  No pleural effusion or pneumothorax. Cardiomediastinal contours within normal range.  No acute osseous finding.  The  IMPRESSION: Interstitial prominence / peribronchial thickening may be chronic versus atypical/viral infection.  No confluent airspace opacity.   Original Report Authenticated By: Jearld Lesch, M.D.     Scheduled Meds: . enoxaparin (LOVENOX) injection  40 mg Subcutaneous Q24H  . guaiFENesin  1,200 mg Oral BID  . influenza  inactive virus vaccine  0.5 mL Intramuscular Tomorrow-1000  . ipratropium  0.5 mg Nebulization Q6H  . levalbuterol  0.63 mg Nebulization Q6H  . levofloxacin (LEVAQUIN) IV  500  mg Intravenous Q24H  . methylPREDNISolone (SOLU-MEDROL) injection  60 mg Intravenous Q12H  . mometasone-formoterol  2 puff Inhalation BID  . nicotine  14 mg Transdermal Daily  . pantoprazole  40 mg Oral Q0600  . PARoxetine  20 mg Oral Daily  . sodium chloride  3 mL Intravenous Q12H   Continuous Infusions: . sodium chloride 125 mL/hr at 01/19/13 1610    Principal Problem:   COPD with acute exacerbation Active Problems:   Hypokalemia   Dehydration   Depression   Tobacco abuse   Tachycardia    Time spent: > 30 mins    [facility]  [facility] Pager [phone]. If 7PM-7AM, please contact night-coverage at [email or web address], password [facility] 01/19/2013, 9:08 AM  LOS: 1 day

## 2013-01-19 NOTE — Care Management Note (Addendum)
    Page 1 of 1   01/19/2013     1:19:17 PM   CARE MANAGEMENT NOTE 01/19/2013  Patient:  Lori Owens, Lori Owens   Account Number:  [id number]  Date Initiated:  01/19/2013  Documentation initiated by:  Lanier Clam  Subjective/Objective Assessment:   ADMITTED W/SOB.COPD.WU:JWJX.     Action/Plan:   FROM HOME.HAS SUPPORT.NO PCP.   Anticipated DC Date:  01/22/2013   Anticipated DC Plan:  HOME/SELF CARE  In-house referral  Financial Counselor  PCP / [facility]      DC Planning Services  CM consult      Choice offered to / List presented to:             Status of service:  In process, will continue to follow Medicare Important Message given?   (If response is "NO", the following Medicare IM given date fields will be blank) Date Medicare IM given:   Date Additional Medicare IM given:    Discharge Disposition:    Per UR Regulation:  Reviewed for med. necessity/level of care/duration of stay  If discussed at Long Length of Stay Meetings, dates discussed:    Comments:  01/19/13 KATHY MAHABIR RN,BSN NCM [phone] PATIENT AYS SHE HAS A NEW INSURANCE OF [employer] & SHE WILL CALL TO GET HER ID#, & LET ME KNOW WHAT IT IS.PROVIDED HER W/PCP LISTING,$4[facility] MED LIST,& OTHER COMMMUNITY RESOURCES.

## 2013-01-19 NOTE — Evaluation (Signed)
Physical Therapy Evaluation Patient Details Name: Lori Owens MRN: [id number] DOB: 10-17-1961 Today's Date: 01/19/2013 Time: 4098-1191 PT Time Calculation (min): 18 min  PT Assessment / Plan / Recommendation Clinical Impression  52 y.o. female admitted with COPD exacerbation and dehydration. Pt ambulated 275' independently without LOB. HR 114 at rest, 132 initially with walking, then 122 at slower pace. SaO2 92% on RA walking. No f/u PT nor DME needed. Will follow acutely.     PT Assessment  Patient needs continued PT services    Follow Up Recommendations  No PT follow up    Does the patient have the potential to tolerate intense rehabilitation      Barriers to Discharge None      Equipment Recommendations  None recommended by PT    Recommendations for Other Services     Frequency Min 3X/week    Precautions / Restrictions Precautions Precaution Comments: monitor HR Restrictions Weight Bearing Restrictions: No   Pertinent Vitals/Pain *HR 114 at rest, 132 initially with walking, then 122 with slower pace SaO2 92% on RA**      Mobility  Bed Mobility Bed Mobility: Not assessed Transfers Transfers: Sit to Stand;Stand to Sit Sit to Stand: 7: Independent;From chair/3-in-1 Stand to Sit: 7: Independent;To chair/3-in-1 Ambulation/Gait Ambulation/Gait Assistance: 7: Independent Ambulation Distance (Feet): 275 Feet Assistive device: None Gait Pattern: Within Functional Limits General Gait Details: HR 114 at rest, 132 initially with walking, then 122 with slower pace; SaO2 92% on RA while walking    Exercises     PT Diagnosis: Difficulty walking  PT Problem List: Cardiopulmonary status limiting activity PT Treatment Interventions: Gait training   PT Goals Acute Rehab PT Goals PT Goal Formulation: With patient Time For Goal Achievement: 02/02/13 Potential to Achieve Goals: Good Pt will Ambulate: >150 feet;Independently;Other (comment) (with HR under 130) PT Goal:  Ambulate - Progress: Goal set today Pt will Perform Home Exercise Program: Independently PT Goal: Perform Home Exercise Program - Progress: Goal set today  Visit Information  Last PT Received On: 01/19/13 Assistance Needed: +1    Subjective Data  Subjective: I stopped smoking yesterday. I have a lot of nervous energy. 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 Communication: No difficulties    Cognition  Cognition Overall Cognitive Status: Appears within functional limits for tasks assessed/performed Arousal/Alertness: Awake/alert Orientation Level: Appears intact for tasks assessed Behavior During Session: [facility] for tasks performed    Extremity/Trunk Assessment Right Upper Extremity Assessment RUE ROM/Strength/Tone: Within functional levels Left Upper Extremity Assessment LUE ROM/Strength/Tone: Within functional levels Right Lower Extremity Assessment RLE ROM/Strength/Tone: Within functional levels RLE Sensation: WFL - Light Touch RLE Coordination: WFL - gross/fine motor Left Lower Extremity Assessment LLE ROM/Strength/Tone: Within functional levels LLE Sensation: WFL - Light Touch LLE Coordination: WFL - gross/fine motor Trunk Assessment Trunk Assessment: Normal   Balance Balance Balance Assessed: Yes High Level Balance High Level Balance Comments: no LOB with walking  End of Session PT - End of Session Activity Tolerance: Patient tolerated treatment well Patient left: in chair;with call bell/phone within reach Nurse Communication: Mobility status   GP     Ralene Bathe  K[place]ler 01/19/2013, 10:30 AM [phone]

## 2013-01-19 NOTE — ED Provider Notes (Signed)
Medical screening examination/treatment/procedure(s) were performed by non-physician practitioner and as supervising physician I was immediately available for consultation/collaboration.  Miyanna Wiersma M Nillie Bartolotta, MD 01/19/13 0602 

## 2013-01-20 DIAGNOSIS — F329 Major depressive disorder, single episode, unspecified: Secondary | ICD-10-CM

## 2013-01-20 LAB — BASIC METABOLIC PANEL
BUN: 17 mg/dL (ref 6–23)
CO2: 24 mEq/L (ref 19–32)
Chloride: 105 mEq/L (ref 96–112)
GFR calc Af Amer: >90 mL/min (ref >90)
Glucose, Bld: 119 mg/dL — ABNORMAL HIGH (ref 70–99)
Potassium: 4.2 mEq/L (ref 3.5–5.1)

## 2013-01-20 LAB — CBC
HCT: 39.9 % (ref 36.0–46.0)
Hemoglobin: 13.2 g/dL (ref 12.0–15.0)
MCH: 30.3 pg (ref 26.0–34.0)
MCHC: 33.1 g/dL (ref 30.0–36.0)
MCV: 91.7 fL (ref 78.0–100.0)

## 2013-01-20 MED ORDER — FLUTICASONE-SALMETEROL 250-50 MCG/DOSE IN AEPB: 1.0000 | INHALATION_SPRAY | Freq: Two times a day (BID) | RESPIRATORY_TRACT | Status: DC

## 2013-01-20 MED ORDER — PREDNISONE 50 MG PO TABS
60.0000 mg | ORAL_TABLET | Freq: Every day | ORAL | Status: DC
  Administered 2013-01-20: 60 mg via ORAL
  Filled 2013-01-20 (×2): qty 1

## 2013-01-20 MED ORDER — LORAZEPAM 0.5 MG PO TABS
0.5000 mg | ORAL_TABLET | Freq: Three times a day (TID) | ORAL | Status: DC | PRN
  Administered 2013-01-20: 0.5 mg via ORAL
  Filled 2013-01-20: qty 1

## 2013-01-20 MED ORDER — GUAIFENESIN ER 600 MG PO TB12: 1200.0000 mg | ORAL_TABLET | Freq: Two times a day (BID) | ORAL | Status: DC

## 2013-01-20 MED ORDER — NICOTINE 21 MG/24HR TD PT24
21.0000 mg | MEDICATED_PATCH | Freq: Every day | TRANSDERMAL | Status: DC
  Filled 2013-01-20: qty 1

## 2013-01-20 MED ORDER — PREDNISONE 20 MG PO TABS: 60.0000 mg | ORAL_TABLET | Freq: Every day | ORAL | Status: DC

## 2013-01-20 MED ORDER — LEVALBUTEROL TARTRATE 45 MCG/ACT IN AERO: 2.0000 | INHALATION_SPRAY | RESPIRATORY_TRACT | Status: DC | PRN

## 2013-01-20 MED ORDER — LORAZEPAM 0.5 MG PO TABS: 0.5000 mg | ORAL_TABLET | Freq: Two times a day (BID) | ORAL | Status: DC | PRN

## 2013-01-20 MED ORDER — TIOTROPIUM BROMIDE MONOHYDRATE 18 MCG IN CAPS: 18.0000 ug | ORAL_CAPSULE | Freq: Every day | RESPIRATORY_TRACT | Status: DC

## 2013-01-20 MED ORDER — NICOTINE 21 MG/24HR TD PT24: 1.0000 | MEDICATED_PATCH | Freq: Every day | TRANSDERMAL | Status: DC

## 2013-01-20 MED ORDER — LEVOFLOXACIN 500 MG PO TABS: 500.0000 mg | ORAL_TABLET | Freq: Every day | ORAL | Status: DC

## 2013-01-20 NOTE — Discharge Summary (Signed)
Physician Discharge Summary  Lori Owens FAO:[id number] DOB: [birth year] DOA: 01/18/2013  PCP: No primary provider on file.  Admit date: 01/18/2013 Discharge date: 01/20/2013  Time spent: 60 minutes  Recommendations for Outpatient Follow-up:  1. Patient is to followup with a PCP one week post discharge.  Discharge Diagnoses:  Principal Problem:   COPD with acute exacerbation Active Problems:   Hypokalemia   Dehydration   Depression   Tobacco abuse   Tachycardia   Discharge Condition: Stable and improved  Diet recommendation: Regular  Filed Weights   01/18/13 1122 01/19/13 0543 01/20/13 0540  Weight: 88.4 kg (194 lb 14.2 oz) 89.359 kg (197 lb) 89.585 kg (197 lb 8 oz)    History of present illness:  Lori Owens is a 52 y.o. female with history of COPD with ongoing tobacco abuse, depression presents to the ED with a 2 to three-day history of worsening shortness of breath, wheezing, productive cough of clear sputum, generalized weakness. Patient states run out of her Advair greater than a month ago. Patient said that she tried her pleural air rescue inhaler with no significant improvement. Patient denies any fever, no chills, no chest pain, no nausea, no vomiting, no abdominal pain, no dysuria.  Patient had presented to the ED one day prior to admission was given some nebulizer treatment and stated had to leave for some family issues. Patient returns back on the day of admission with no significant improvement in his symptoms. In the ED patient was given a nebulizer dose as well as another dose of continuous nebulizers. Patient's oxygen was removed and patient was ambulated down the hallway with sats of 89% on ambulation.  Chest x-ray done was negative for any acute infiltrate. Basic metabolic profile done showed a potassium of 3.4 otherwise was within normal limits. CBC done was unremarkable.   Hospital Course:  #1 acute COPD exacerbation  Patient was admitted  With an acute COPD  exacerbation and placed on IV steriods, IV antibiotics, nebs, mucinex and followed. Patient improved clinically on a daily basis and IV steroids were tapered down to oral steroids which she tolerated. Patient will be discharged home on 5 more days of oral Levaquin and steroid taper, Advair, Spiriva. Patient is to followup with PCP one week post discharge.  #2 hypokalemia  On admission patient was noted to be hypokalemic which was felt to be secondary to albuterol. Patient's potassium was repleted and had resolved by day of discharge. #3 sinus tachycardia  Likely secondary to problem #1 and albuterol nebs. TSH is within normal limits. Heart rate is improved. Patient was placed on Xopenex nebs which she tolerated. Patient improved clinically and remained asymptomatic. Patient was also placed on some Ativan as needed. Patient will be discharged home in stable and improved condition to followup with PCP one week post discharge.   The rest of patient's chronic medical issues remained stable throughout the hospitalization and patient discharged in stable and improved condition.   Procedures:  CXR 01/18/13  Consultations:  None  Discharge Exam: Filed Vitals:   01/20/13 0849 01/20/13 0900 01/20/13 1309 01/20/13 1340  BP:   159/99   Pulse:  123 107   Temp:   98.7 F (37.1 C)   TempSrc:   Oral   Resp:   20   Height:      Weight:      SpO2: 95% 95% 95% 92%    General: NAD Cardiovascular: RRR Respiratory: CTAB  Discharge Instructions      Discharge Orders  Future Orders Complete By Expires     Diet general  As directed     Discharge instructions  As directed     Comments:      Patient is to pick a PCP and followup in 1 week. Stop smoking.    Increase activity slowly  As directed         Medication List    TAKE these medications       albuterol 108 (90 BASE) MCG/ACT inhaler  Commonly known as:  PROVENTIL HFA;VENTOLIN HFA  Inhale 2 puffs into the lungs every 6 (six) hours  as needed.     Fluticasone-Salmeterol 250-50 MCG/DOSE Aepb  Commonly known as:  ADVAIR  Inhale 1 puff into the lungs every 12 (twelve) hours.     guaiFENesin 600 MG 12 hr tablet  Commonly known as:  MUCINEX  Take 2 tablets (1,200 mg total) by mouth 2 (two) times daily. Take for 5 days.     ibuprofen 200 MG tablet  Commonly known as:  ADVIL,MOTRIN  Take 200-400 mg by mouth every 6 (six) hours as needed for pain.     levalbuterol 45 MCG/ACT inhaler  Commonly known as:  XOPENEX HFA  Inhale 2 puffs into the lungs every 4 (four) hours as needed for wheezing. Use 2 puffs 3 times daily x3 days, then use as needed.     levofloxacin 500 MG tablet  Commonly known as:  LEVAQUIN  Take 1 tablet (500 mg total) by mouth daily. Take for 5 days     LORazepam 0.5 MG tablet  Commonly known as:  ATIVAN  Take 1 tablet (0.5 mg total) by mouth 2 (two) times daily as needed for anxiety.     nicotine 21 mg/24hr patch  Commonly known as:  NICODERM CQ - dosed in mg/24 hours  Place 1 patch onto the skin daily.  Start taking on:  01/21/2013     PARoxetine 20 MG tablet  Commonly known as:  PAXIL  Take 20 mg by mouth every morning.     predniSONE 20 MG tablet  Commonly known as:  DELTASONE  Take 3 tablets (60 mg total) by mouth daily before breakfast. Take 3 tablets [60 mg] daily x2 days, then 2 tablets [40 mg] daily x3 days, then one tablet [20 mg] daily x3 days then stop.     tiotropium 18 MCG inhalation capsule  Commonly known as:  SPIRIVA HANDIHALER  Place 1 capsule (18 mcg total) into inhaler and inhale daily.       Follow-up Information   Schedule an appointment as soon as possible for a visit in 1 week to follow up. (Patient is to pick a PCP for followup in 1 week.)        The results of significant diagnostics from this hospitalization (including imaging, microbiology, ancillary and laboratory) are listed below for reference.    Significant Diagnostic Studies: Dg Chest 2  View  01/18/2013  *RADIOLOGY REPORT*  Clinical Data: Shortness of breath  CHEST - 2 VIEW  Comparison: 08/12/2012  Findings: Interstitial prominence / peribronchial thickening.  No confluent airspace opacity.  No pleural effusion or pneumothorax. Cardiomediastinal contours within normal range.  No acute osseous finding.  The  IMPRESSION: Interstitial prominence / peribronchial thickening may be chronic versus atypical/viral infection.  No confluent airspace opacity.   Original Report Authenticated By: Jearld Lesch, M.D.     Microbiology: No results found for this or any previous visit (from the past 240 hour(s)).  Labs: Basic Metabolic Panel:  Recent Labs Lab 01/18/13 0910 01/18/13 0915 01/19/13 0443 01/20/13 0508  NA  --  143 140 139  K  --  3.4* 4.3 4.2  CL  --  110 105 105  CO2  --   --  24 24  GLUCOSE  --  132* 175* 119*  BUN  --  15 13 17   CREATININE 0.52 0.60 0.53 0.53  CALCIUM  --   --  9.3 9.7  MG 1.7  --   --  2.1   Liver Function Tests:  Recent Labs Lab 01/18/13 0910  AST 18  ALT 18  ALKPHOS 99  BILITOT 0.2*  PROT 7.2  ALBUMIN 3.8   No results found for this basename: LIPASE, AMYLASE,  in the last 168 hours No results found for this basename: AMMONIA,  in the last 168 hours CBC:  Recent Labs Lab 01/18/13 0910 01/18/13 0915 01/19/13 0443 01/20/13 0508  WBC 4.2  --  6.3 11.1*  HGB 12.7 13.6 12.6 13.2  HCT 39.1 40.0 39.0 39.9  MCV 90.7  --  91.5 91.7  PLT 233  --  258 282   Cardiac Enzymes: No results found for this basename: CKTOTAL, CKMB, CKMBINDEX, TROPONINI,  in the last 168 hours BNP: BNP (last 3 results) No results found for this basename: PROBNP,  in the last 8760 hours CBG: No results found for this basename: GLUCAP,  in the last 168 hours     Signed:  [facility]  [facility] 01/20/2013, 5:45 PM

## 2013-02-01 ENCOUNTER — Encounter (HOSPITAL_COMMUNITY): Payer: Self-pay | Admitting: Emergency Medicine

## 2013-02-01 ENCOUNTER — Emergency Department (HOSPITAL_COMMUNITY)
Admission: EM | Admit: 2013-02-01 | Discharge: 2013-02-01 | Disposition: A | Discharge status: Home / Self Care | Payer: Self-pay | Attending: Emergency Medicine | Admitting: Emergency Medicine

## 2013-02-01 ENCOUNTER — Emergency Department (HOSPITAL_COMMUNITY): Payer: Self-pay

## 2013-02-01 DIAGNOSIS — Z79899 Other long term (current) drug therapy: Secondary | ICD-10-CM | POA: Insufficient documentation

## 2013-02-01 DIAGNOSIS — F419 Anxiety disorder, unspecified: Secondary | ICD-10-CM

## 2013-02-01 DIAGNOSIS — F411 Generalized anxiety disorder: Secondary | ICD-10-CM | POA: Insufficient documentation

## 2013-02-01 DIAGNOSIS — J449 Chronic obstructive pulmonary disease, unspecified: Secondary | ICD-10-CM | POA: Insufficient documentation

## 2013-02-01 DIAGNOSIS — J4489 Other specified chronic obstructive pulmonary disease: Secondary | ICD-10-CM | POA: Insufficient documentation

## 2013-02-01 DIAGNOSIS — Z87891 Personal history of nicotine dependence: Secondary | ICD-10-CM | POA: Insufficient documentation

## 2013-02-01 LAB — CBC
Hemoglobin: 12.8 g/dL (ref 12.0–15.0)
MCH: 31.4 pg (ref 26.0–34.0)
MCHC: 34 g/dL (ref 30.0–36.0)

## 2013-02-01 LAB — URINALYSIS, ROUTINE W REFLEX MICROSCOPIC
Nitrite: NEGATIVE
Specific Gravity, Urine: 1.033 — ABNORMAL HIGH (ref 1.005–1.030)
pH: 5 (ref 5.0–8.0)

## 2013-02-01 LAB — POCT I-STAT TROPONIN I: Troponin i, poc: 0 ng/mL (ref 0.00–0.08)

## 2013-02-01 LAB — COMPREHENSIVE METABOLIC PANEL
ALT: 41 U/L — ABNORMAL HIGH (ref 0–35)
BUN: 16 mg/dL (ref 6–23)
Calcium: 9.4 mg/dL (ref 8.4–10.5)
Creatinine, Ser: 0.77 mg/dL (ref 0.50–1.10)
GFR calc Af Amer: >90 mL/min (ref >90)
Glucose, Bld: 105 mg/dL — ABNORMAL HIGH (ref 70–99)
Sodium: 139 mEq/L (ref 135–145)
Total Protein: 6.5 g/dL (ref 6.0–8.3)

## 2013-02-01 LAB — URINE MICROSCOPIC-ADD ON

## 2013-02-01 MED ORDER — LORAZEPAM 1 MG PO TABS: 1.0000 mg | ORAL_TABLET | Freq: Four times a day (QID) | ORAL | Status: DC | PRN

## 2013-02-01 NOTE — ED Notes (Signed)
Per patient, was in hospital last week for COPD-has been having irregular HR for past 3 days-increased today

## 2013-02-01 NOTE — Progress Notes (Signed)
During [facility] ED 02/01/13 visit CM spoke with pt who confirms self pay [facility] resident with no pcp.  She reports she has already signed up for insurance coverage through the affordable care act market place and coverage starts on 02/06/13 -"silver"She can not recall the name of the pcp at this time  CM discussed and provided written information for self pay pcps, importance of pcp for f/u care, www.needymeds.org, discounted pharmacies, MATCH program and other [place] resources such as financial assistance, DSS and  health department Reviewed Health connect number to assist with finding self pay provider close to pt's residence. Reviewed resources for [place] self pay pcps like [facility], family medicine at [address], [facility] family practice, general medical clinics, [facility] urgent care plus others, [facility] out [facility], housing, affordable care act/health reform (deadline 02/05/13) and other resources in [employer]. Pt voiced understanding and appreciation of resources provided

## 2013-02-01 NOTE — ED Provider Notes (Signed)
History     CSN: [id number]  Arrival date & time 02/01/13  1617   First MD Initiated Contact with Patient 02/01/13 1628      Chief Complaint  Patient presents with  . Palpitations    (Consider location/radiation/quality/duration/timing/severity/associated sxs/prior treatment) HPI Comments: Lori Owens is a 52 y.o. female with a history of COPD presents emergency department with chief complaint of palpitations.  Patient states that she was recently admitted to the hospital for a COPD exacerbation (3/13-3/15/2014) at which time she quit smoking cigarettes.  Patient reports that she was tachycardic throughout hospital stay, but that the physician said that it was likely due to nebulizer treatments.  Patient was discharged on Ativan as needed which she states she has used.  She questions if she is suffering from anxiety as possible cause of palpitations stating that "they were bad until the Ativan ran out today."  Patient had a followup appointment scheduled for April 1, however she is here requesting a medication refill of her Ativan.  Patient denies any current shortness of breath, chest pain, leg swelling, cough, hemoptysis, syncope, dyspnea on exertion, lightheadedness.  No other complaints at this time.  The history is provided by the patient.    Past Medical History  Diagnosis Date  . COPD (chronic obstructive pulmonary disease)     Past Surgical History  Procedure Laterality Date  . Abdominal hysterectomy    . Nose surgery    . Tonsillectomy      No family history on file.  History  Substance Use Topics  . Smoking status: Former Smoker    Quit date: 01/18/2013  . Smokeless tobacco: Not on file  . Alcohol Use: No    OB History   Grav Para Term Preterm Abortions TAB SAB Ect Mult Living                  Review of Systems  All other systems reviewed and are negative.    Allergies  Review of patient's allergies indicates no known allergies.  Home Medications    Current Outpatient Rx  Name  Route  Sig  Dispense  Refill  . guaiFENesin (MUCINEX) 600 MG 12 hr tablet   Oral   Take 600 mg by mouth once.         . levalbuterol (XOPENEX HFA) 45 MCG/ACT inhaler   Inhalation   Inhale 1-2 puffs into the lungs every 4 (four) hours as needed for wheezing.         Marland Kitchen PARoxetine (PAXIL) 40 MG tablet   Oral   Take 40 mg by mouth every morning.           BP 145/91  Pulse 104  Temp(Src) 98.1 F (36.7 C) (Oral)  Resp 18  SpO2 98%  Physical Exam  Nursing note and vitals reviewed. Constitutional: She is oriented to person, place, and time. She appears well-developed and well-nourished. No distress.  Patient not in visible distress  HENT:  Head: Normocephalic and atraumatic.  Eyes: Conjunctivae and EOM are normal. Pupils are equal, round, and reactive to light.  Neck: Normal range of motion. Neck supple. Normal carotid pulses, no hepatojugular reflux and no JVD present. Carotid bruit is not present.  No carotid bruits  Cardiovascular:  RRR, no aberrancy on auscultation, intact distal pulses no pitting edema bilaterally.  Pulmonary/Chest: Effort normal and breath sounds normal. No respiratory distress.  Lungs clear to auscultation bilaterally  Abdominal: Soft. She exhibits no distension. There is no tenderness.  Nonpulsatile  aorta  Musculoskeletal: Normal range of motion. She exhibits no tenderness.  Neurological: She is alert and oriented to person, place, and time. She displays a negative Romberg sign.  Cranial nerves III through XII intact, normal coordination, negative Romberg, strength 5/5 bilaterally. No ataxia  Skin: Skin is warm and dry. No pallor.  Concerning 2 cm long by 0.5 cm irregular-shaped black skin lesion in left upper back.  Psychiatric: She has a normal mood and affect. Her behavior is normal.    ED Course  Procedures (including critical care time)  Labs Reviewed  COMPREHENSIVE METABOLIC PANEL - Abnormal; Notable for  the following:    Glucose, Bld 105 (*)    ALT 41 (*)    All other components within normal limits  URINALYSIS, ROUTINE W REFLEX MICROSCOPIC - Abnormal; Notable for the following:    APPearance CLOUDY (*)    Specific Gravity, Urine 1.033 (*)    Leukocytes, UA SMALL (*)    All other components within normal limits  URINE MICROSCOPIC-ADD ON - Abnormal; Notable for the following:    Squamous Epithelial / LPF FEW (*)    All other components within normal limits  CBC  POCT I-STAT TROPONIN I   No results found.   No diagnosis found.   Date: 02/01/2013  Rate: 106  Rhythm: Tachycardic  QRS Axis: normal  Intervals: normal  ST/T Wave abnormalities: normal  Conduction Disutrbances: none  Narrative Interpretation:   Old EKG Reviewed: No significant changes noted   MDM  Patient presents to the emergency department complaining of symptoms consistent with anxiety/palpitations.  Arrhythmia not seen on cardiac monitoring while in emergency department.  The patient is resting comfortably, in no apparent distress and asymptomatic.  Labs, ECG and vital signs reviewed.  No exophthalmos, no signs of UTI.  Stress reducing mechanisms discussed including caffeine intake.  Patient has been advised to discuss w pcp at f-u scheduled for this Friday. Discharged with a 3 day prescription for Ativan 1mg .  Strict return precautions discussed development of chest pain.           Jaci Carrel, [place] 02/01/13 [phone]

## 2013-02-02 ENCOUNTER — Encounter (HOSPITAL_COMMUNITY): Payer: Self-pay | Admitting: *Deleted

## 2013-02-02 NOTE — ED Provider Notes (Signed)
Medical screening examination/treatment/procedure(s) were performed by non-physician practitioner and as supervising physician I was immediately available for consultation/collaboration.  Kendle Turbin R. Novak Stgermaine, MD 02/02/13 0015 

## 2013-03-28 ENCOUNTER — Emergency Department (HOSPITAL_COMMUNITY)
Admission: EM | Admit: 2013-03-28 | Discharge: 2013-03-28 | Disposition: A | Discharge status: Home / Self Care | Payer: BC Managed Care – PPO | Attending: Emergency Medicine | Admitting: Emergency Medicine

## 2013-03-28 ENCOUNTER — Encounter (HOSPITAL_COMMUNITY): Payer: Self-pay | Admitting: Emergency Medicine

## 2013-03-28 DIAGNOSIS — F3289 Other specified depressive episodes: Secondary | ICD-10-CM | POA: Insufficient documentation

## 2013-03-28 DIAGNOSIS — R05 Cough: Secondary | ICD-10-CM | POA: Insufficient documentation

## 2013-03-28 DIAGNOSIS — J441 Chronic obstructive pulmonary disease with (acute) exacerbation: Secondary | ICD-10-CM | POA: Insufficient documentation

## 2013-03-28 DIAGNOSIS — R059 Cough, unspecified: Secondary | ICD-10-CM | POA: Insufficient documentation

## 2013-03-28 DIAGNOSIS — F329 Major depressive disorder, single episode, unspecified: Secondary | ICD-10-CM | POA: Insufficient documentation

## 2013-03-28 DIAGNOSIS — Z79899 Other long term (current) drug therapy: Secondary | ICD-10-CM | POA: Insufficient documentation

## 2013-03-28 DIAGNOSIS — Z87891 Personal history of nicotine dependence: Secondary | ICD-10-CM | POA: Insufficient documentation

## 2013-03-28 DIAGNOSIS — Z8669 Personal history of other diseases of the nervous system and sense organs: Secondary | ICD-10-CM | POA: Insufficient documentation

## 2013-03-28 MED ORDER — IPRATROPIUM BROMIDE 0.02 % IN SOLN
0.5000 mg | Freq: Once | RESPIRATORY_TRACT | Status: AC
  Administered 2013-03-28: 0.5 mg via RESPIRATORY_TRACT
  Filled 2013-03-28: qty 2.5

## 2013-03-28 MED ORDER — ALBUTEROL SULFATE (5 MG/ML) 0.5% IN NEBU
5.0000 mg | INHALATION_SOLUTION | Freq: Once | RESPIRATORY_TRACT | Status: AC
  Administered 2013-03-28: 5 mg via RESPIRATORY_TRACT
  Filled 2013-03-28: qty 1

## 2013-03-28 MED ORDER — ALBUTEROL SULFATE HFA 108 (90 BASE) MCG/ACT IN AERS
2.0000 | INHALATION_SPRAY | RESPIRATORY_TRACT | Status: DC | PRN
  Administered 2013-03-28: 2 via RESPIRATORY_TRACT
  Filled 2013-03-28: qty 6.7

## 2013-03-28 NOTE — ED Provider Notes (Addendum)
History     CSN: [id number]  Arrival date & time 03/28/13  0911   First MD Initiated Contact with Patient 03/28/13 1008      Chief Complaint  Patient presents with  . Shortness of Breath    (Consider location/radiation/quality/duration/timing/severity/associated sxs/prior treatment) HPI Comments: Patient recently hospitalized approximately one month ago for COPD exacerbation. She is supposed to be on albuterol and Advair but was unable to fill them due to cost. However recently she has obtained insurance but will now cover her medications. However she did not want to wait and let her symptoms worsen so she came here.  Patient is a 52 y.o. female presenting with shortness of breath. The history is provided by the patient.  Shortness of Breath Severity:  Mild Onset quality:  Gradual Timing:  Intermittent Progression:  Worsening Chronicity:  Recurrent Context: weather changes   Relieved by:  Nothing Worsened by:  Activity Ineffective treatments:  None tried Associated symptoms: cough and wheezing   Associated symptoms: no abdominal pain, no fever and no sputum production   Risk factors: tobacco use   Risk factors comment:  History of COPD   Past Medical History  Diagnosis Date  . Depression 01/18/2013  . Tobacco abuse 01/18/2013  . Headache   . COPD (chronic obstructive pulmonary disease)     Past Surgical History  Procedure Laterality Date  . Throat surgery    . Abdominal hysterectomy    . Nose surgery    . Tonsillectomy      No family history on file.  History  Substance Use Topics  . Smoking status: Former Smoker    Quit date: 01/18/2013  . Smokeless tobacco: Not on file  . Alcohol Use: No    OB History   Grav Para Term Preterm Abortions TAB SAB Ect Mult Living                  Review of Systems  Constitutional: Negative for fever.  Respiratory: Positive for cough, shortness of breath and wheezing. Negative for sputum production.   Gastrointestinal:  Negative for abdominal pain.  All other systems reviewed and are negative.    Allergies  Review of patient's allergies indicates no known allergies.  Home Medications   Current Outpatient Rx  Name  Route  Sig  Dispense  Refill  . ibuprofen (ADVIL,MOTRIN) 200 MG tablet   Oral   Take 200-400 mg by mouth every 6 (six) hours as needed for pain.          Marland Kitchen PARoxetine (PAXIL) 40 MG tablet   Oral   Take 40 mg by mouth every morning.         Marland Kitchen EXPIRED: albuterol (PROVENTIL HFA;VENTOLIN HFA) 108 (90 BASE) MCG/ACT inhaler   Inhalation   Inhale 2 puffs into the lungs every 6 (six) hours as needed.         . Fluticasone-Salmeterol (ADVAIR) 250-50 MCG/DOSE AEPB   Inhalation   Inhale 1 puff into the lungs every 12 (twelve) hours.   60 each   0     There were no vitals taken for this visit.  Physical Exam  Nursing note and vitals reviewed. Constitutional: She is oriented to person, place, and time. She appears well-developed and well-nourished. No distress.  HENT:  Head: Normocephalic and atraumatic.  Mouth/Throat: Oropharynx is clear and moist.  Eyes: Conjunctivae and EOM are normal. Pupils are equal, round, and reactive to light.  Neck: Normal range of motion. Neck supple.  Cardiovascular: Normal  rate, regular rhythm and intact distal pulses.   No murmur heard. Pulmonary/Chest: Effort normal. No respiratory distress. She has wheezes. She has no rales.  Abdominal: Soft. She exhibits no distension. There is no tenderness. There is no rebound and no guarding.  Musculoskeletal: Normal range of motion. She exhibits no edema and no tenderness.  Neurological: She is alert and oriented to person, place, and time.  Skin: Skin is warm and dry. No rash noted. No erythema.  Psychiatric: She has a normal mood and affect. Her behavior is normal.    ED Course  Procedures (including critical care time)  Labs Reviewed - No data to display No results found.   1. COPD exacerbation        MDM   This patient has a history of COPD and has not been taking any of her medications because she cannot afford them but recently has gotten insurance and is going to fill the prescriptions later today. She states she has been wheezing and feels that she needs a breathing treatment but otherwise is in no acute distress. She has expiratory wheezing diffusely on exam is satting normally and is in no apparent distress. She states she came in early this time but forgot to bad as she has had to be admitted in the past.  Albuterol and Atrovent ordered. Patient has no complaints concerning for infectious etiology at this time.   11:20 AM Patient states she is improved after the treatment. She was given albuterol inhaler. Minimal wheezing on expiration. Patient will her Advair today and return for any problems     Gwyneth Sprout, MD 03/28/13 1120  Gwyneth Sprout, MD 03/28/13 1123

## 2013-03-28 NOTE — ED Notes (Signed)
Mother (Me'Me') [phone] or [phone]

## 2013-03-28 NOTE — ED Notes (Signed)
Pt states "I need a breathing treatment".  Pt states hx of COPD and states she feels SOB.  Pt does not appear to be in any distress. Pt is able to finish sentences with no issues.

## 2013-04-07 ENCOUNTER — Encounter (HOSPITAL_COMMUNITY): Payer: Self-pay | Admitting: *Deleted

## 2013-04-07 ENCOUNTER — Emergency Department (HOSPITAL_COMMUNITY)
Admission: EM | Admit: 2013-04-07 | Discharge: 2013-04-07 | Disposition: A | Discharge status: Home / Self Care | Payer: BC Managed Care – PPO | Attending: Emergency Medicine | Admitting: Emergency Medicine

## 2013-04-07 DIAGNOSIS — Z8679 Personal history of other diseases of the circulatory system: Secondary | ICD-10-CM | POA: Insufficient documentation

## 2013-04-07 DIAGNOSIS — J441 Chronic obstructive pulmonary disease with (acute) exacerbation: Secondary | ICD-10-CM | POA: Insufficient documentation

## 2013-04-07 DIAGNOSIS — F329 Major depressive disorder, single episode, unspecified: Secondary | ICD-10-CM | POA: Insufficient documentation

## 2013-04-07 DIAGNOSIS — Z87891 Personal history of nicotine dependence: Secondary | ICD-10-CM | POA: Insufficient documentation

## 2013-04-07 DIAGNOSIS — Z79899 Other long term (current) drug therapy: Secondary | ICD-10-CM | POA: Insufficient documentation

## 2013-04-07 DIAGNOSIS — F3289 Other specified depressive episodes: Secondary | ICD-10-CM | POA: Insufficient documentation

## 2013-04-07 MED ORDER — ALBUTEROL SULFATE HFA 108 (90 BASE) MCG/ACT IN AERS
2.0000 | INHALATION_SPRAY | RESPIRATORY_TRACT | Status: DC | PRN
  Administered 2013-04-07: 2 via RESPIRATORY_TRACT
  Filled 2013-04-07: qty 6.7

## 2013-04-07 MED ORDER — ALBUTEROL SULFATE (5 MG/ML) 0.5% IN NEBU
5.0000 mg | INHALATION_SOLUTION | Freq: Once | RESPIRATORY_TRACT | Status: AC
  Administered 2013-04-07: 5 mg via RESPIRATORY_TRACT

## 2013-04-07 MED ORDER — IPRATROPIUM BROMIDE 0.02 % IN SOLN
0.5000 mg | Freq: Once | RESPIRATORY_TRACT | Status: AC
  Administered 2013-04-07: 0.5 mg via RESPIRATORY_TRACT
  Filled 2013-04-07: qty 2.5

## 2013-04-07 MED ORDER — ALBUTEROL SULFATE (5 MG/ML) 0.5% IN NEBU
5.0000 mg | INHALATION_SOLUTION | Freq: Once | RESPIRATORY_TRACT | Status: AC
  Administered 2013-04-07: 5 mg via RESPIRATORY_TRACT
  Filled 2013-04-07: qty 1

## 2013-04-07 MED ORDER — PREDNISONE 20 MG PO TABS
60.0000 mg | ORAL_TABLET | Freq: Once | ORAL | Status: AC
  Administered 2013-04-07: 60 mg via ORAL
  Filled 2013-04-07: qty 3

## 2013-04-07 MED ORDER — IPRATROPIUM BROMIDE 0.02 % IN SOLN
RESPIRATORY_TRACT | Status: AC
  Filled 2013-04-07: qty 2.5

## 2013-04-07 MED ORDER — ALBUTEROL SULFATE (5 MG/ML) 0.5% IN NEBU
INHALATION_SOLUTION | RESPIRATORY_TRACT | Status: AC
  Filled 2013-04-07: qty 1

## 2013-04-07 MED ORDER — IPRATROPIUM BROMIDE 0.02 % IN SOLN
0.5000 mg | Freq: Once | RESPIRATORY_TRACT | Status: AC
  Administered 2013-04-07: 0.5 mg via RESPIRATORY_TRACT

## 2013-04-07 MED ORDER — PREDNISONE (PAK) 10 MG PO TABS: 10.0000 mg | ORAL_TABLET | Freq: Every day | ORAL | Status: DC

## 2013-04-07 NOTE — ED Provider Notes (Signed)
History     CSN: [id number]  Arrival date & time 04/07/13  0444   First MD Initiated Contact with Patient 04/07/13 [phone]      Chief Complaint  Patient presents with  . Wheezing    (Consider location/radiation/quality/duration/timing/severity/associated sxs/prior treatment) HPI Comments: Patient with hx COPD, not on medications because while she has health insurance now states her deductible and copay are too high, reports increased SOB and wheezing x 2-3 days.  Coughing since yesterday, nonproductive.  Denies fevers, chills, chest pain.  Should be on Advair and albuterol but is not taking anything.  Pt states this feels like typical COPD symptoms.   Patient is a 52 y.o. female presenting with wheezing. The history is provided by the patient.  Wheezing Associated symptoms: cough and shortness of breath   Associated symptoms: no chest pain and no fever     Past Medical History  Diagnosis Date  . Depression 01/18/2013  . Tobacco abuse 01/18/2013  . Headache(784.0)   . COPD (chronic obstructive pulmonary disease)     Past Surgical History  Procedure Laterality Date  . Throat surgery    . Abdominal hysterectomy    . Nose surgery    . Tonsillectomy      History reviewed. No pertinent family history.  History  Substance Use Topics  . Smoking status: Former Smoker    Quit date: 01/18/2013  . Smokeless tobacco: Not on file  . Alcohol Use: No    OB History   Grav Para Term Preterm Abortions TAB SAB Ect Mult Living                  Review of Systems  Constitutional: Negative for fever and chills.  Respiratory: Positive for cough, shortness of breath and wheezing.   Cardiovascular: Negative for chest pain.  Gastrointestinal: Negative for vomiting, abdominal pain and diarrhea.  Genitourinary: Negative for dysuria, urgency and frequency.  Psychiatric/Behavioral: Negative for confusion.    Allergies  Review of patient's allergies indicates no known allergies.  Home  Medications   Current Outpatient Rx  Name  Route  Sig  Dispense  Refill  . albuterol (PROVENTIL HFA;VENTOLIN HFA) 108 (90 BASE) MCG/ACT inhaler   Inhalation   Inhale 2 puffs into the lungs every 6 (six) hours as needed for wheezing or shortness of breath.         Marland Kitchen PARoxetine (PAXIL) 40 MG tablet   Oral   Take 40 mg by mouth every morning.           BP 125/87  Pulse 102  Temp(Src) 98.4 F (36.9 C) (Oral)  Resp 20  SpO2 96%  Physical Exam  Nursing note and vitals reviewed. Constitutional: She appears well-developed and well-nourished. No distress.  HENT:  Head: Normocephalic and atraumatic.  Neck: Neck supple.  Cardiovascular: Normal rate and regular rhythm.   Pulmonary/Chest: Effort normal. No respiratory distress. She has wheezes. She has no rales. She exhibits no tenderness.  Diffuse expiratory wheezes  Abdominal: Soft. She exhibits no distension. There is no tenderness. There is no rebound and no guarding.  Neurological: She is alert.  Skin: She is not diaphoretic.    ED Course  Procedures (including critical care time)  Labs Reviewed - No data to display No results found.  8:30 AM Breathing is much improved. Pt reports she is ready to go home.  Lung exam improved.  Much improved expiratory wheezing - continued wheezing but now very slight.  Moving air well in all fields.  No rales or ronchi.     1. COPD exacerbation     MDM  Pt with hx COPD not on medications at home p/w increased SOB and wheezing x 2-3 days without fever.  Nontoxic. Pt declines medications through [facility].  Discussed findings, treatment plan, follow up with patient.  Pt given return precautions.  Pt verbalizes understanding and agrees with plan.     COPD exacerbation  I doubt any other EMC precluding discharge at this time including, but not necessarily limited to the following: pneumonia, ACS        Trixie Dredge, PA-C 04/07/13 [phone]

## 2013-04-07 NOTE — ED Notes (Signed)
Pt c/o increased shortness of breath and wheezing. Pt has COPD and has been out of her medications.

## 2013-04-08 NOTE — ED Provider Notes (Signed)
Medical screening examination/treatment/procedure(s) were performed by non-physician practitioner and as supervising physician I was immediately available for consultation/collaboration.  Kuron Docken T Monico Sudduth, MD 04/08/13 0957 

## 2013-04-09 ENCOUNTER — Emergency Department (HOSPITAL_COMMUNITY): Payer: BC Managed Care – PPO

## 2013-04-09 ENCOUNTER — Observation Stay (HOSPITAL_COMMUNITY)
Admission: EM | Admit: 2013-04-09 | Discharge: 2013-04-10 | DRG: 088 | Disposition: A | Discharge status: Home / Self Care | Payer: BC Managed Care – PPO | Attending: Internal Medicine | Admitting: Internal Medicine

## 2013-04-09 ENCOUNTER — Encounter (HOSPITAL_COMMUNITY): Payer: Self-pay | Admitting: *Deleted

## 2013-04-09 DIAGNOSIS — F329 Major depressive disorder, single episode, unspecified: Secondary | ICD-10-CM | POA: Diagnosis present

## 2013-04-09 DIAGNOSIS — E876 Hypokalemia: Secondary | ICD-10-CM | POA: Insufficient documentation

## 2013-04-09 DIAGNOSIS — Z87891 Personal history of nicotine dependence: Secondary | ICD-10-CM | POA: Insufficient documentation

## 2013-04-09 DIAGNOSIS — E860 Dehydration: Secondary | ICD-10-CM

## 2013-04-09 DIAGNOSIS — I472 Ventricular tachycardia: Secondary | ICD-10-CM

## 2013-04-09 DIAGNOSIS — R0602 Shortness of breath: Secondary | ICD-10-CM

## 2013-04-09 DIAGNOSIS — Z720 Tobacco use: Secondary | ICD-10-CM | POA: Diagnosis present

## 2013-04-09 DIAGNOSIS — R000 Tachycardia, unspecified: Secondary | ICD-10-CM

## 2013-04-09 DIAGNOSIS — J441 Chronic obstructive pulmonary disease with (acute) exacerbation: Principal | ICD-10-CM | POA: Insufficient documentation

## 2013-04-09 DIAGNOSIS — Z79899 Other long term (current) drug therapy: Secondary | ICD-10-CM | POA: Insufficient documentation

## 2013-04-09 DIAGNOSIS — F3289 Other specified depressive episodes: Secondary | ICD-10-CM | POA: Insufficient documentation

## 2013-04-09 LAB — BASIC METABOLIC PANEL
BUN: 10 mg/dL (ref 6–23)
Creatinine, Ser: 0.53 mg/dL (ref 0.50–1.10)
GFR calc Af Amer: >90 mL/min (ref >90)
GFR calc non Af Amer: >90 mL/min (ref >90)

## 2013-04-09 LAB — POCT I-STAT TROPONIN I: Troponin i, poc: 0 ng/mL (ref 0.00–0.08)

## 2013-04-09 LAB — CBC
MCHC: 33.6 g/dL (ref 30.0–36.0)
RDW: 13.1 % (ref 11.5–15.5)

## 2013-04-09 MED ORDER — IPRATROPIUM BROMIDE 0.02 % IN SOLN
0.5000 mg | Freq: Once | RESPIRATORY_TRACT | Status: AC
  Administered 2013-04-09: 0.5 mg via RESPIRATORY_TRACT
  Filled 2013-04-09: qty 2.5

## 2013-04-09 MED ORDER — ALBUTEROL SULFATE (5 MG/ML) 0.5% IN NEBU
5.0000 mg | INHALATION_SOLUTION | Freq: Once | RESPIRATORY_TRACT | Status: AC
  Administered 2013-04-09: 5 mg via RESPIRATORY_TRACT
  Filled 2013-04-09: qty 1

## 2013-04-09 MED ORDER — HYDROCODONE-ACETAMINOPHEN 5-325 MG PO TABS
1.0000 | ORAL_TABLET | ORAL | Status: DC | PRN
  Administered 2013-04-09: 1 via ORAL
  Filled 2013-04-09: qty 1

## 2013-04-09 MED ORDER — ONDANSETRON HCL 4 MG/2ML IJ SOLN: 4.0000 mg | Freq: Three times a day (TID) | INTRAMUSCULAR | Status: DC | PRN

## 2013-04-09 MED ORDER — ONDANSETRON HCL 4 MG/2ML IJ SOLN: 4.0000 mg | Freq: Four times a day (QID) | INTRAMUSCULAR | Status: DC | PRN

## 2013-04-09 MED ORDER — IPRATROPIUM BROMIDE 0.02 % IN SOLN: 0.5000 mg | RESPIRATORY_TRACT | Status: DC | PRN

## 2013-04-09 MED ORDER — SODIUM CHLORIDE 0.9 % IJ SOLN
3.0000 mL | Freq: Two times a day (BID) | INTRAMUSCULAR | Status: DC
  Administered 2013-04-09: 3 mL via INTRAVENOUS

## 2013-04-09 MED ORDER — PAROXETINE HCL 20 MG PO TABS
40.0000 mg | ORAL_TABLET | Freq: Every morning | ORAL | Status: DC
  Administered 2013-04-10: 40 mg via ORAL
  Filled 2013-04-09: qty 2

## 2013-04-09 MED ORDER — ALBUTEROL (5 MG/ML) CONTINUOUS INHALATION SOLN
10.0000 mg/h | INHALATION_SOLUTION | RESPIRATORY_TRACT | Status: DC
  Administered 2013-04-09: 10 mg/h via RESPIRATORY_TRACT
  Filled 2013-04-09: qty 20

## 2013-04-09 MED ORDER — AZITHROMYCIN 500 MG PO TABS
500.0000 mg | ORAL_TABLET | ORAL | Status: DC
  Administered 2013-04-09: 500 mg via ORAL
  Filled 2013-04-09 (×2): qty 1

## 2013-04-09 MED ORDER — GUAIFENESIN-DM 100-10 MG/5ML PO SYRP
5.0000 mL | ORAL_SOLUTION | ORAL | Status: DC | PRN
  Administered 2013-04-09: 5 mL via ORAL
  Filled 2013-04-09: qty 10

## 2013-04-09 MED ORDER — METHYLPREDNISOLONE SODIUM SUCC 125 MG IJ SOLR
60.0000 mg | Freq: Four times a day (QID) | INTRAMUSCULAR | Status: DC
  Administered 2013-04-09 – 2013-04-10 (×3): 60 mg via INTRAVENOUS
  Filled 2013-04-09 (×7): qty 0.96

## 2013-04-09 MED ORDER — ALBUTEROL SULFATE (5 MG/ML) 0.5% IN NEBU: 2.5000 mg | INHALATION_SOLUTION | RESPIRATORY_TRACT | Status: DC | PRN

## 2013-04-09 MED ORDER — HYDROMORPHONE HCL PF 1 MG/ML IJ SOLN: 1.0000 mg | INTRAMUSCULAR | Status: DC | PRN

## 2013-04-09 MED ORDER — DEXAMETHASONE SODIUM PHOSPHATE 10 MG/ML IJ SOLN
10.0000 mg | Freq: Once | INTRAMUSCULAR | Status: AC
  Administered 2013-04-09: 10 mg via INTRAVENOUS
  Filled 2013-04-09: qty 1

## 2013-04-09 MED ORDER — ONDANSETRON HCL 4 MG PO TABS: 4.0000 mg | ORAL_TABLET | Freq: Four times a day (QID) | ORAL | Status: DC | PRN

## 2013-04-09 MED ORDER — DEXTROSE 5 % IV SOLN
1.0000 g | INTRAVENOUS | Status: DC
  Administered 2013-04-09: 1 g via INTRAVENOUS
  Filled 2013-04-09: qty 10

## 2013-04-09 MED ORDER — ENOXAPARIN SODIUM 40 MG/0.4ML ~~LOC~~ SOLN
40.0000 mg | SUBCUTANEOUS | Status: DC
  Administered 2013-04-09: 40 mg via SUBCUTANEOUS
  Filled 2013-04-09 (×2): qty 0.4

## 2013-04-09 MED ORDER — ALBUTEROL SULFATE (5 MG/ML) 0.5% IN NEBU: 5.0000 mg | INHALATION_SOLUTION | RESPIRATORY_TRACT | Status: DC | PRN

## 2013-04-09 MED ORDER — ALBUTEROL SULFATE (5 MG/ML) 0.5% IN NEBU
2.5000 mg | INHALATION_SOLUTION | Freq: Four times a day (QID) | RESPIRATORY_TRACT | Status: DC
  Administered 2013-04-09 – 2013-04-10 (×3): 2.5 mg via RESPIRATORY_TRACT
  Filled 2013-04-09 (×3): qty 0.5

## 2013-04-09 NOTE — ED Notes (Signed)
Bed:WA06<BR> Expected date:<BR> Expected time:<BR> Means of arrival:<BR> Comments:<BR>

## 2013-04-09 NOTE — H&P (Signed)
[facility] History and Physical  Sherra Kimmons ZOX:[id number] DOB: 1961/06/26 DOA: 04/09/2013  Referring physician: ED physician PCP: No primary provider on file.   Chief Complaint: shortness of breath  HPI:  Pt is 52 yo female with history of COPD, depression, tobacco abuse, headaches, presented to the ED with main concern of progressively worsening shortness of breath, that initially started 2-3 days prior to this admission, associated with subjective fevers, chills, malaise, productive cough of clear sputum, wheezing. Pt explains that albuterol inhaler has not helped with symptoms, no specific aggravating factors. She denies recent sick contacts or exposures, no recent similar events. Pt explains she has been seen in ED 2 days prior to this admission and was sent home on Prednisone but her symptoms have not improved. Pt does not have PCP due to financial difficulties.   In ED, pt persistently wheezing but responding well to nebulizer treatment and solumedrol administration. She was noted to have oxygen saturations in high 80's and has improved on oxygen via [place]. [facility] asked to admit for further management of COPD exacerbation.    Assessment and Plan:  Principal Problem:   COPD with acute exacerbation - pt has significantly improved since admission and responding well to current nebulizer and solumedrol therapy - will admit to telemetry floor due to tachycardia which is likely secondary to nebulizer treatment  - will continue providing supportive care with solumedrol and oxygen, nebulizers scheduled and as needed - will plan on tapering solumedrol down and transition to Prednisone in AM  - obtain sputum analysis, culture and gram stain, start empiric abx for now Active Problems:   Depression - appears to be clinically stable - continue Paxil   Tobacco abuse - cessation discussed in detail    Hypokalemia - within normal limits - will monitor closely in the setting of beta agonist  use - BMP in AM  Code Status: Full Family Communication: Pt at bedside Disposition Plan: Admit to telemetry unit    Review of Systems:  Constitutional: Positive  for fever, chills and malaise/fatigue. Negative for diaphoresis.  HENT: Negative for hearing loss, ear pain, nosebleeds, congestion, sore throat, neck pain, tinnitus and ear discharge.   Eyes: Negative for blurred vision, double vision, photophobia, pain, discharge and redness.  Respiratory: Positive for cough, sputum production, shortness of breath, wheezing.   Cardiovascular: Negative for chest pain, palpitations, orthopnea, claudication and leg swelling.  Gastrointestinal: Negative for nausea, vomiting and abdominal pain. Negative for heartburn, constipation, blood in stool and melena.  Genitourinary: Negative for dysuria, urgency, frequency, hematuria and flank pain.  Musculoskeletal: Negative for myalgias, back pain, joint pain and falls.  Skin: Negative for itching and rash.  Neurological: Negative for dizziness and weakness. Negative for tingling, tremors, sensory change, speech change, focal weakness, loss of consciousness and headaches.  Endo/Heme/Allergies: Negative for environmental allergies and polydipsia. Does not bruise/bleed easily.  Psychiatric/Behavioral: Negative for suicidal ideas. The patient is not nervous/anxious.      Past Medical History  Diagnosis Date  . Depression 01/18/2013  . Tobacco abuse 01/18/2013  . Headache(784.0)   . COPD (chronic obstructive pulmonary disease)     Past Surgical History  Procedure Laterality Date  . Throat surgery    . Abdominal hysterectomy    . Nose surgery    . Tonsillectomy      Social History:  reports that she quit smoking about 2 months ago. She has never used smokeless tobacco. She reports that she does not drink alcohol or use illicit drugs.  No Known Allergies  No significant family medical history   Prior to Admission medications   Medication Sig Start  Date End Date Taking? Authorizing Provider  albuterol (PROVENTIL HFA;VENTOLIN HFA) 108 (90 BASE) MCG/ACT inhaler Inhale 2 puffs into the lungs every 6 (six) hours as needed for wheezing or shortness of breath.   Yes Historical Provider, MD  PARoxetine (PAXIL) 40 MG tablet Take 40 mg by mouth every morning.    Yes Historical Provider, MD  predniSONE (DELTASONE) 10 MG tablet Take 10-60 mg by mouth daily. Dosepak. Day 1: 6 tabs.  Day 2: 5 tabs  Day 3: 4 tabs  Day 4: 3 tabs  Day 5: 2 tabs  Day 6: 1 tab.  Started 04/08/13   Yes Historical Provider, MD    Physical Exam: Filed Vitals:   04/09/13 1252 04/09/13 1426 04/09/13 1500 04/09/13 1702  BP:  114/65  127/86  Pulse:   109 106  Temp:  97.8 F (36.6 C)  98.7 F (37.1 C)  TempSrc:  Oral  Oral  Resp:  16 23 20   Height:    5\' 6"  (1.676 m)  Weight:    89.359 kg (197 lb)  SpO2: 95% 98% 98% 97%    Physical Exam  Constitutional: Appears well-developed and well-nourished. No distress.  HENT: Normocephalic. External right and left ear normal. Oropharynx is clear and moist.  Eyes: Conjunctivae and EOM are normal. PERRLA, no scleral icterus.  Neck: Normal ROM. Neck supple. No JVD. No tracheal deviation. No thyromegaly.  CVS: RRR, S1/S2 +, no murmurs, no gallops, no carotid bruit.  Pulmonary: Effort and breath sounds normal, expiratory wheezing with scattered rhonchi  Abdominal: Soft. BS +,  no distension, tenderness, rebound or guarding.  Musculoskeletal: Normal range of motion. No edema and no tenderness.  Lymphadenopathy: No lymphadenopathy noted, cervical, inguinal. Neuro: Alert. Normal reflexes, muscle tone coordination. No cranial nerve deficit. Skin: Skin is warm and dry. No rash noted. Not diaphoretic. No erythema. No pallor.  Psychiatric: Normal mood and affect. Behavior, judgment, thought content normal.   Labs on Admission:  Basic Metabolic Panel:  Recent Labs Lab 04/09/13 1130 04/09/13 1414  NA 138  --   K 4.4  --   CL 103  --    CO2 23  --   GLUCOSE 104*  --   BUN 10  --   CREATININE 0.53  --   CALCIUM 10.1  --   MG  --  2.0   CBC:  Recent Labs Lab 04/09/13 1130  WBC 6.9  HGB 13.6  HCT 40.5  MCV 91.0  PLT 273   Radiological Exams on Admission: Dg Chest 2 View (if Patient Has Fever And/or Copd)  04/09/2013   *RADIOLOGY REPORT*  Clinical Data: Shortness of breath  CHEST - 2 VIEW  Comparison: 01/18/2013  Findings: The heart and pulmonary vascularity are within normal limits.  Stable mild interstitial changes are again seen.  No new focal infiltrate is noted.  No effusion is seen.  The bony structures are stable.  IMPRESSION: No acute abnormality is noted.   Original Report Authenticated By: Alcide Clever, M.D.    EKG: Normal sinus rhythm, no ST/T wave changes  Debbora Presto, MD  [facility] Pager [phone]  If 7PM-7AM, please contact night-coverage [email or web address] Password [facility] 04/09/2013, 5:11 PM

## 2013-04-09 NOTE — ED Notes (Signed)
Pt from home with reports of cough, shortness of breath and generalized fatigue that started on Saturday. Pt reports being seen for cough & shortness of breath a couple days ago and was feeling better but symptoms returned with fatigue. Pt reports, "I think I am just sick of being sick". Pt denies N/V/D or fever but endorses hx of COPD.

## 2013-04-09 NOTE — ED Provider Notes (Signed)
History     CSN: [id number]  Arrival date & time 04/09/13  3086   First MD Initiated Contact with Patient 04/09/13 1106      Chief Complaint  Patient presents with  . Shortness of Breath  . Cough  . Fatigue    (Consider location/radiation/quality/duration/timing/severity/associated sxs/prior treatment) HPI Comments: Patient with PMH significant for COPD, depression, tobacco abuse, headaches, presents to the ED for increasing shortness of breath, cough, generalized fatigue, and intermittent lightheadedness. Patient seen in the ED 2 days ago for the same. Symptoms initially improved with 2 albuterol and Atrovent nebulizer treatments. Patient was sent home on prednisone which she has been taking as directed. Next that her symptoms usually improve with this treatment regimen, but this time they have not. Symptoms worse with any kind of exertion or lying flat to sleep. Notes she has been waking herself up due to the wheezing.  Also has non-productive cough without associated fevers, sweats, or chills.  Dizziness follows heavy bouts of coughing and usually resolves in a few seconds. Palpitations have been occuring intermittent over the past few weeks and are independent of exertion.  States she feels a "fluttering' sensation in her chest.  Denies any chest pain or diaphoresis.  Pt does not have a PCP due to high copay.  Advair usually controls her sx but it is too expensive.  Has never seen cardiology in the past.  No LE edema, calf pain, recent travel or surgeries.  No hx of DVT or PE.  Patient is a 52 y.o. female presenting with shortness of breath and cough. The history is provided by the patient.  Shortness of Breath Associated symptoms: cough   Cough Associated symptoms: shortness of breath     Past Medical History  Diagnosis Date  . Depression 01/18/2013  . Tobacco abuse 01/18/2013  . Headache(784.0)   . COPD (chronic obstructive pulmonary disease)     Past Surgical History  Procedure  Laterality Date  . Throat surgery    . Abdominal hysterectomy    . Nose surgery    . Tonsillectomy      History reviewed. No pertinent family history.  History  Substance Use Topics  . Smoking status: Former Smoker    Quit date: 01/18/2013  . Smokeless tobacco: Never Used  . Alcohol Use: No    OB History   Grav Para Term Preterm Abortions TAB SAB Ect Mult Living                  Review of Systems  Respiratory: Positive for cough and shortness of breath.   Neurological: Positive for light-headedness.  All other systems reviewed and are negative.    Allergies  Review of patient's allergies indicates no known allergies.  Home Medications   Current Outpatient Rx  Name  Route  Sig  Dispense  Refill  . albuterol (PROVENTIL HFA;VENTOLIN HFA) 108 (90 BASE) MCG/ACT inhaler   Inhalation   Inhale 2 puffs into the lungs every 6 (six) hours as needed for wheezing or shortness of breath.         Marland Kitchen PARoxetine (PAXIL) 40 MG tablet   Oral   Take 40 mg by mouth every morning.          . predniSONE (DELTASONE) 10 MG tablet   Oral   Take 10-60 mg by mouth daily. Dosepak. Day 1: 6 tabs.  Day 2: 5 tabs  Day 3: 4 tabs  Day 4: 3 tabs  Day 5: 2 tabs  Day 6: 1 tab.  Started 04/08/13           BP 142/97  Pulse 108  Temp(Src) 98.6 F (37 C) (Oral)  Resp 18  SpO2 94%  Physical Exam  Nursing note and vitals reviewed. Constitutional: She is oriented to person, place, and time. She appears well-developed and well-nourished.  HENT:  Head: Normocephalic and atraumatic.  Mouth/Throat: Uvula is midline, oropharynx is clear and moist and mucous membranes are normal.  Eyes: Conjunctivae and EOM are normal. Pupils are equal, round, and reactive to light.  Neck: Normal range of motion.  Cardiovascular: Normal rate, regular rhythm and normal heart sounds.   Pulmonary/Chest: Effort normal. Not tachypneic. No respiratory distress. She has no decreased breath sounds. She has wheezes.   Expiratory wheezes in all lung fields  Abdominal: Soft. Bowel sounds are normal. There is no tenderness. There is no guarding.  Musculoskeletal: Normal range of motion. She exhibits no edema.  No LE edema, calf tenderness, or asymmetry; negative Homan's sign  Neurological: She is alert and oriented to person, place, and time.  Skin: Skin is warm and dry.  Psychiatric: She has a normal mood and affect.    ED Course  Procedures (including critical care time)   Date: 04/09/2013  Rate: 98  Rhythm: normal sinus rhythm  QRS Axis: normal  Intervals: normal  ST/T Wave abnormalities: normal  Conduction Disutrbances:none  Narrative Interpretation: NSR, no STEMI  Old EKG Reviewed: unchanged    Labs Reviewed  BASIC METABOLIC PANEL - Abnormal; Notable for the following:    Glucose, Bld 104 (*)    All other components within normal limits  BASIC METABOLIC PANEL - Abnormal; Notable for the following:    Glucose, Bld 127 (*)    All other components within normal limits  CULTURE, BLOOD (ROUTINE X 2)  CULTURE, BLOOD (ROUTINE X 2)  CULTURE, EXPECTORATED SPUTUM-ASSESSMENT  GRAM STAIN  CBC  MAGNESIUM  CBC  HIV ANTIBODY (ROUTINE TESTING)  LEGIONELLA ANTIGEN, URINE  STREP PNEUMONIAE URINARY ANTIGEN  POCT I-STAT TROPONIN I   Dg Chest 2 View (if Patient Has Fever And/or Copd)  04/09/2013   *RADIOLOGY REPORT*  Clinical Data: Shortness of breath  CHEST - 2 VIEW  Comparison: 01/18/2013  Findings: The heart and pulmonary vascularity are within normal limits.  Stable mild interstitial changes are again seen.  No new focal infiltrate is noted.  No effusion is seen.  The bony structures are stable.  IMPRESSION: No acute abnormality is noted.   Original Report Authenticated By: Alcide Clever, M.D.     1. COPD exacerbation   2. Ventricular tachycardia, non-sustained   3. Shortness of breath   4. COPD with acute exacerbation   5. Dehydration   6. Hypokalemia   7. Depression   8. Tobacco abuse    9. Tachycardia       MDM   52 y.o. F presenting to the ED for SOB and cough.  Multiple previous visits for similar sx.  Wheezing has increased over the past few days, worse with exertion and lying flat to sleep.  No fevers, sweats, or chills.  EKG NSR, no acute ischemic changes.  CXR clear.  Considered PE given tachycardia and mild hypoxia, however these symptoms are consistent with her usual exacerbations and further testing was not pursued.    Albuterol/atroven neb x2 given with mild improvement.  Continuous neb given and pt feels better.  Has had run of v-tach, approx 7 beats.  Pt notes she can  feel these episodes.  No cardiac hx and has never seen cardiologist before.  Consulted hospitalist, Dr. Izola Price, pt will be admitted for COPD exacerbation and to eval v-tach.  [facility], step down.  Temp admit orders placed.  VS stable for transfer.      Garlon Hatchet, PA-C 04/10/13 1107

## 2013-04-10 DIAGNOSIS — R0602 Shortness of breath: Secondary | ICD-10-CM

## 2013-04-10 DIAGNOSIS — J441 Chronic obstructive pulmonary disease with (acute) exacerbation: Secondary | ICD-10-CM

## 2013-04-10 LAB — BASIC METABOLIC PANEL
BUN: 14 mg/dL (ref 6–23)
Chloride: 105 mEq/L (ref 96–112)
GFR calc Af Amer: >90 mL/min (ref >90)
GFR calc non Af Amer: >90 mL/min (ref >90)
Glucose, Bld: 127 mg/dL — ABNORMAL HIGH (ref 70–99)
Potassium: 4.2 mEq/L (ref 3.5–5.1)
Sodium: 141 mEq/L (ref 135–145)

## 2013-04-10 LAB — CBC
HCT: 38.8 % (ref 36.0–46.0)
Hemoglobin: 13.1 g/dL (ref 12.0–15.0)
MCH: 30.9 pg (ref 26.0–34.0)
MCHC: 33.8 g/dL (ref 30.0–36.0)
RBC: 4.24 MIL/uL (ref 3.87–5.11)

## 2013-04-10 LAB — HIV ANTIBODY (ROUTINE TESTING W REFLEX): HIV: NONREACTIVE

## 2013-04-10 MED ORDER — PREDNISONE 20 MG PO TABS: ORAL_TABLET | ORAL | Status: DC

## 2013-04-10 MED ORDER — MOMETASONE FURO-FORMOTEROL FUM 100-5 MCG/ACT IN AERO: 2.0000 | INHALATION_SPRAY | Freq: Two times a day (BID) | RESPIRATORY_TRACT | Status: DC

## 2013-04-10 MED ORDER — PREDNISONE 20 MG PO TABS
40.0000 mg | ORAL_TABLET | Freq: Every day | ORAL | Status: DC
  Filled 2013-04-10: qty 2

## 2013-04-10 MED ORDER — AZITHROMYCIN 500 MG PO TABS: 500.0000 mg | ORAL_TABLET | ORAL | Status: DC

## 2013-04-10 MED ORDER — MOMETASONE FURO-FORMOTEROL FUM 100-5 MCG/ACT IN AERO
2.0000 | INHALATION_SPRAY | Freq: Two times a day (BID) | RESPIRATORY_TRACT | Status: DC
  Administered 2013-04-10: 2 via RESPIRATORY_TRACT
  Filled 2013-04-10: qty 8.8

## 2013-04-10 NOTE — Discharge Summary (Signed)
Physician Discharge Summary  Lori Owens KGM:[id number] DOB: 1961/04/24 DOA: 04/09/2013  PCP: No primary provider on file.  Admit date: 04/09/2013 Discharge date: 04/10/2013  Time spent: 35 minutes  Recommendations for Outpatient Follow-up:  1. Needs to establish with PCP  Discharge Diagnoses:  Principal Problem:   COPD with acute exacerbation Active Problems:   Hypokalemia   Depression   Tobacco abuse   Discharge Condition: improved  Diet recommendation: cardiac  Filed Weights   04/09/13 1702  Weight: 89.359 kg (197 lb)    History of present illness:  Pt is 52 yo female with history of COPD, depression, tobacco abuse, headaches, presented to the ED with main concern of progressively worsening shortness of breath, that initially started 2-3 days prior to this admission, associated with subjective fevers, chills, malaise, productive cough of clear sputum, wheezing. Pt explains that albuterol inhaler has not helped with symptoms, no specific aggravating factors. She denies recent sick contacts or exposures, no recent similar events. Pt explains she has been seen in ED 2 days prior to this admission and was sent home on Prednisone but her symptoms have not improved. Pt does not have PCP due to financial difficulties.  In ED, pt persistently wheezing but responding well to nebulizer treatment and solumedrol administration. She was noted to have oxygen saturations in high 80's and has improved on oxygen via [place]. [facility] asked to admit for further management of COPD exacerbation   Hospital Course:  Patient has been out of her advair- changed her to dulera and gave her a coupon as well as the dulera she had in the hospital, non smoking, not requiring O2   Discharge Exam: Filed Vitals:   04/09/13 2110 04/10/13 0134 04/10/13 0553 04/10/13 0809  BP: 122/86  136/90   Pulse: 112  84   Temp: 98.4 F (36.9 C)  98.2 F (36.8 C)   TempSrc: Oral  Oral   Resp: 19  19   Height:      Weight:       SpO2: 96% 93% 98% 97%    General: A+Ox3, NAD  Discharge Instructions  Discharge Orders   Future Orders Complete By Expires     Diet general  As directed     Discharge instructions  As directed     Comments:      Needs to establish with PCP    Increase activity slowly  As directed         Medication List    TAKE these medications       albuterol 108 (90 BASE) MCG/ACT inhaler  Commonly known as:  PROVENTIL HFA;VENTOLIN HFA  Inhale 2 puffs into the lungs every 6 (six) hours as needed for wheezing or shortness of breath.     azithromycin 500 MG tablet  Commonly known as:  ZITHROMAX  Take 1 tablet (500 mg total) by mouth daily.     mometasone-formoterol 100-5 MCG/ACT Aero  Commonly known as:  DULERA  Inhale 2 puffs into the lungs 2 (two) times daily.     PARoxetine 40 MG tablet  Commonly known as:  PAXIL  Take 40 mg by mouth every morning.     predniSONE 20 MG tablet  Commonly known as:  DELTASONE  40 mg x3 days, 30 mg x 3 days, 20 mg x 3 days, 10 mg x 3 days       No Known Allergies    The results of significant diagnostics from this hospitalization (including imaging, microbiology, ancillary and laboratory) are  listed below for reference.    Significant Diagnostic Studies: Dg Chest 2 View (if Patient Has Fever And/or Copd)  04/09/2013   *RADIOLOGY REPORT*  Clinical Data: Shortness of breath  CHEST - 2 VIEW  Comparison: 01/18/2013  Findings: The heart and pulmonary vascularity are within normal limits.  Stable mild interstitial changes are again seen.  No new focal infiltrate is noted.  No effusion is seen.  The bony structures are stable.  IMPRESSION: No acute abnormality is noted.   Original Report Authenticated By: Alcide Clever, M.D.    Microbiology: Recent Results (from the past 240 hour(s))  CULTURE, BLOOD (ROUTINE X 2)     Status: None   Collection Time    04/09/13  6:00 PM      Result Value Range Status   Specimen Description BLOOD RIGHT HAND   Final    Special Requests BOTTLES DRAWN AEROBIC AND ANAEROBIC 10CC   Final   Culture  Setup Time 04/09/2013 23:55   Final   Culture     Final   Value:        BLOOD CULTURE RECEIVED NO GROWTH TO DATE CULTURE WILL BE HELD FOR 5 DAYS BEFORE ISSUING A FINAL NEGATIVE REPORT   Report Status PENDING   Incomplete  CULTURE, BLOOD (ROUTINE X 2)     Status: None   Collection Time    04/09/13  6:10 PM      Result Value Range Status   Specimen Description BLOOD LEFT ARM   Final   Special Requests BOTTLES DRAWN AEROBIC AND ANAEROBIC 10CC   Final   Culture  Setup Time 04/09/2013 23:53   Final   Culture     Final   Value:        BLOOD CULTURE RECEIVED NO GROWTH TO DATE CULTURE WILL BE HELD FOR 5 DAYS BEFORE ISSUING A FINAL NEGATIVE REPORT   Report Status PENDING   Incomplete     Labs: Basic Metabolic Panel:  Recent Labs Lab 04/09/13 1130 04/09/13 1414 04/10/13 0507  NA 138  --  141  K 4.4  --  4.2  CL 103  --  105  CO2 23  --  24  GLUCOSE 104*  --  127*  BUN 10  --  14  CREATININE 0.53  --  0.57  CALCIUM 10.1  --  9.7  MG  --  2.0  --    Liver Function Tests: No results found for this basename: AST, ALT, ALKPHOS, BILITOT, PROT, ALBUMIN,  in the last 168 hours No results found for this basename: LIPASE, AMYLASE,  in the last 168 hours No results found for this basename: AMMONIA,  in the last 168 hours CBC:  Recent Labs Lab 04/09/13 1130 04/10/13 0507  WBC 6.9 6.4  HGB 13.6 13.1  HCT 40.5 38.8  MCV 91.0 91.5  PLT 273 271   Cardiac Enzymes: No results found for this basename: CKTOTAL, CKMB, CKMBINDEX, TROPONINI,  in the last 168 hours BNP: BNP (last 3 results) No results found for this basename: PROBNP,  in the last 8760 hours CBG: No results found for this basename: GLUCAP,  in the last 168 hours     Signed:  Benjamine Mola, Lori Owens  [facility] 04/10/2013, 2:04 PM

## 2013-04-10 NOTE — ED Provider Notes (Signed)
Medical screening examination/treatment/procedure(s) were conducted as a shared visit with non-physician practitioner(s) and myself.  I personally evaluated the patient during the encounter.   Patient presents with complaints of shortness of breath and cough. Symptoms likely secondary to her COPD, she does have persistent bronchospasm despite treatment. Patient has, however, been on the monitor during evaluation and has had a short run of nonsustained V. tach. This will require hospitalization and further management.  Gilda Crease, MD 04/10/13 [phone]

## 2013-04-10 NOTE — Care Management Note (Signed)
    Page 1 of 1   04/10/2013     12:15:33 PM   CARE MANAGEMENT NOTE 04/10/2013  Patient:  Lori Owens, Lori Owens   Account Number:  [id number]  Date Initiated:  04/10/2013  Documentation initiated by:  Lanier Clam  Subjective/Objective Assessment:   ADMITTED W/SOB.COPD.     Action/Plan:   FROM HOME.NO PCP.HAS PHARMACY.   Anticipated DC Date:  04/10/2013   Anticipated DC Plan:  HOME/SELF CARE  In-house referral  PCP / [facility]      DC Planning Services  CM consult  Medication Assistance      Choice offered to / List presented to:             Status of service:  Completed, signed off Medicare Important Message given?   (If response is "NO", the following Medicare IM given date fields will be blank) Date Medicare IM given:   Date Additional Medicare IM given:    Discharge Disposition:  HOME/SELF CARE  Per UR Regulation:  Reviewed for med. necessity/level of care/duration of stay  If discussed at Long Length of Stay Meetings, dates discussed:    Comments:  04/10/13 [facility] RN,BSN NCM [phone] PATIENT VOICED DIFFICULTY AFFORDING MEDS.PROVIDED W/DULERA FREE TRIAL OFFER,[facility] WEBSITE,DEPT OF SOCIAL SERVICE TEL#, PCP LISTING,ENCOURAGED ADULT CLINIC.INFORMED OF HER INSURANCE W/SCRIPT COVERAGE OBLIGATION.PATIENT VOICED UNDERSTANDING.

## 2013-04-15 LAB — CULTURE, BLOOD (ROUTINE X 2): Culture: NO GROWTH

## 2013-04-19 ENCOUNTER — Ambulatory Visit: Payer: BC Managed Care – PPO | Attending: Family Medicine | Admitting: Family Medicine

## 2013-04-19 VITALS — BP 157/107 | HR 90 | Temp 98.6°F | Resp 20 | Ht 65.0 in | Wt 199.0 lb

## 2013-04-19 DIAGNOSIS — F17210 Nicotine dependence, cigarettes, uncomplicated: Secondary | ICD-10-CM | POA: Insufficient documentation

## 2013-04-19 DIAGNOSIS — D235 Other benign neoplasm of skin of trunk: Secondary | ICD-10-CM | POA: Insufficient documentation

## 2013-04-19 DIAGNOSIS — D492 Neoplasm of unspecified behavior of bone, soft tissue, and skin: Secondary | ICD-10-CM

## 2013-04-19 DIAGNOSIS — Z720 Tobacco use: Secondary | ICD-10-CM

## 2013-04-19 DIAGNOSIS — F3289 Other specified depressive episodes: Secondary | ICD-10-CM | POA: Insufficient documentation

## 2013-04-19 DIAGNOSIS — R0602 Shortness of breath: Secondary | ICD-10-CM | POA: Insufficient documentation

## 2013-04-19 DIAGNOSIS — D229 Melanocytic nevi, unspecified: Secondary | ICD-10-CM | POA: Insufficient documentation

## 2013-04-19 DIAGNOSIS — F17200 Nicotine dependence, unspecified, uncomplicated: Secondary | ICD-10-CM

## 2013-04-19 DIAGNOSIS — F329 Major depressive disorder, single episode, unspecified: Secondary | ICD-10-CM

## 2013-04-19 MED ORDER — VARENICLINE TARTRATE 0.5 MG X 11 & 1 MG X 42 PO MISC: ORAL | Status: DC

## 2013-04-19 MED ORDER — PAROXETINE HCL 40 MG PO TABS: 40.0000 mg | ORAL_TABLET | Freq: Every morning | ORAL | Status: DC

## 2013-04-19 MED ORDER — ALBUTEROL SULFATE HFA 108 (90 BASE) MCG/ACT IN AERS: 2.0000 | INHALATION_SPRAY | Freq: Four times a day (QID) | RESPIRATORY_TRACT | Status: DC | PRN

## 2013-04-19 MED ORDER — IPRATROPIUM-ALBUTEROL 0.5-2.5 (3) MG/3ML IN SOLN
3.0000 mL | Freq: Once | RESPIRATORY_TRACT | Status: AC
  Administered 2013-04-19: 3 mL via RESPIRATORY_TRACT

## 2013-04-19 NOTE — Progress Notes (Signed)
Patient ID: Lori Owens, female   DOB: Jun 03, 1961, 52 y.o.   MRN: [id number]  CC: hospital follow up   HPI: Pt wanting to have a mole checked on her left side of back.  Pt says it has been there for years but has gotten bigger and also has been painful.  Pt says that she has been taking her medications and reports that she is highly motivated to stop smoking and wants to try Chantix.    No Known Allergies Past Medical History  Diagnosis Date  . Depression 01/18/2013  . Tobacco abuse 01/18/2013  . Headache(784.0)   . COPD (chronic obstructive pulmonary disease)    Current Outpatient Prescriptions on File Prior to Visit  Medication Sig Dispense Refill  . mometasone-formoterol (DULERA) 100-5 MCG/ACT AERO Inhale 2 puffs into the lungs 2 (two) times daily.  1 Inhaler  0  . predniSONE (DELTASONE) 20 MG tablet 40 mg x3 days, 30 mg x 3 days, 20 mg x 3 days, 10 mg x 3 days  30 tablet  0  . azithromycin (ZITHROMAX) 500 MG tablet Take 1 tablet (500 mg total) by mouth daily.  5 tablet  0   No current facility-administered medications on file prior to visit.   Family History  Problem Relation Age of Onset  . Diabetes Maternal Grandfather   . Heart disease Maternal Grandfather   . Cancer Maternal Grandfather   . Hypertension Maternal Grandfather    History   Social History  . Marital Status: Single    Spouse Name: N/A    Number of Children: N/A  . Years of Education: N/A   Occupational History  . Not on file.   Social History Main Topics  . Smoking status: Current Every Day Smoker    Last Attempt to Quit: 01/18/2013  . Smokeless tobacco: Never Used  . Alcohol Use: No  . Drug Use: No  . Sexually Active: No   Other Topics Concern  . Not on file   Social History Narrative   ** Merged History Encounter **        Review of Systems  Constitutional: Negative for fever, chills, diaphoresis, activity change, appetite change and fatigue.  HENT: Negative for ear pain, nosebleeds,  congestion, facial swelling, rhinorrhea, neck pain, neck stiffness and ear discharge.   Eyes: Negative for pain, discharge, redness, itching and visual disturbance.  Respiratory: Negative for cough, choking, chest tightness, shortness of breath, wheezing and stridor.   Cardiovascular: Negative for chest pain, palpitations and leg swelling.  Gastrointestinal: Negative for abdominal distention.  Genitourinary: Negative for dysuria, urgency, frequency, hematuria, flank pain, decreased urine volume, difficulty urinating and dyspareunia.  Musculoskeletal: Negative for back pain, joint swelling, arthralgias and gait problem.  Neurological: Negative for dizziness, tremors, seizures, syncope, facial asymmetry, speech difficulty, weakness, light-headedness, numbness and headaches.  Hematological: Negative for adenopathy. Does not bruise/bleed easily.  Psychiatric/Behavioral: Negative for hallucinations, behavioral problems, confusion, dysphoric mood, decreased concentration and agitation.    Objective:   Filed Vitals:   04/19/13 0918  BP: 157/107  Pulse: 90  Temp: 98.6 F (37 C)  Resp: 20    Physical Exam  Constitutional: Appears well-developed and well-nourished. No distress.  HENT: Normocephalic. External right and left ear normal. Oropharynx is clear and moist.  Eyes: Conjunctivae and EOM are normal. PERRLA, no scleral icterus.  Neck: Normal ROM. Neck supple. No JVD. No tracheal deviation. No thyromegaly.  CVS: RRR, S1/S2 +, no murmurs, no gallops, no carotid bruit.  Pulmonary: insp/exp wheezes  and rales bilateral   Abdominal: Soft. BS +,  no distension, tenderness, rebound or guarding.  Musculoskeletal: Normal range of motion. No edema and no tenderness.  Lymphadenopathy: No lymphadenopathy noted, cervical, inguinal. Neuro: Alert. Normal reflexes, muscle tone coordination. No cranial nerve deficit. Skin: Skin is warm and dry. Irregular flat nevus different colored and irregular border on  left side of upper back  Psychiatric: Normal mood and affect. Behavior, judgment, thought content normal.   Lab Results  Component Value Date   WBC 6.4 04/10/2013   HGB 13.1 04/10/2013   HCT 38.8 04/10/2013   MCV 91.5 04/10/2013   PLT 271 04/10/2013   Lab Results  Component Value Date   CREATININE 0.57 04/10/2013   BUN 14 04/10/2013   NA 141 04/10/2013   K 4.2 04/10/2013   CL 105 04/10/2013   CO2 24 04/10/2013    Lab Results  Component Value Date   HGBA1C  Value: 5.8 (NOTE)                                                                       According to the ADA Clinical Practice Recommendations for 2011, when HbA1c is used as a screening test:   >=6.5%   Diagnostic of Diabetes Mellitus           (if abnormal result  is confirmed)  5.7-6.4%   Increased risk of developing Diabetes Mellitus  References:Diagnosis and Classification of Diabetes Mellitus,Diabetes Care,2011,34(Suppl 1):S62-S69 and Standards of Medical Care in         Diabetes - 2011,Diabetes Care,2011,34  (Suppl 1):S11-S61.* 02/19/2011   Lipid Panel  No results found for this basename: chol, trig, hdl, cholhdl, vldl, ldlcalc     Assessment and plan:   Patient Active Problem List   Diagnosis Date Noted  . Suspicious mole 04/19/2013  . SOB (shortness of breath) 04/19/2013  . Smoker 04/19/2013  . COPD with acute exacerbation 01/18/2013  . Hypokalemia 01/18/2013  . Dehydration 01/18/2013  . Depression 01/18/2013  . Tobacco abuse 01/18/2013  . Tachycardia 01/18/2013   Duoneb treatment given in office today which did improve symptoms.  Consider home neb if symptoms persist after prednisone taper completed.   Close follow up in 2 weeks scheduled.   Elevated Bp likely related to prednisone use and dulera  Smoking, trial of chantix starter pack Smoking cessation strongly advised  Suspicious mole on left upper back - Refer to dermatology for biopsy further evaluation and treatment If pt has not heard from derm in 1 week for appt, I  asked pt to please call our office to find out.    Depression - Continue paxil 40 mg take 1 po daily. Rx givenb today  Follow up in 2 weeks  The patient was given clear instructions to go to ER or return to medical center if symptoms don't improve, worsen or new problems develop.  The patient verbalized understanding.  The patient was told to call to get lab results if they haven't heard anything in the next week.    Rodney Langton, MD, CDE, FAAFP [facility] [place], [place]

## 2013-04-19 NOTE — Patient Instructions (Addendum)
Mole Moles are usually harmless skin spots. Moles can be different colors, from light brown to black. People usually develop many of them over their lifetime. Moles increase in number during the first 3 decades of life. Moles can be anywhere on the body. They may be flat or raised and sometimes they contain hair. Although moles can change over time, they only rarely turn into skin cancer. The moles that do not turn into cancer (benign) are usually:  Small (the size of a pencil eraser or less).  One color.  Smooth.  Have a uniform border.  Round and oval and do not change much in appearance over time. Malignant melanoma is a skin cancer that starts like a mole. Moles that were present at birth are more likely to turn into a melanoma later in life especially if they were very large (larger than 7 inches [20 cm] in diameter) at birth. Melanomas are different from moles. Melanomas:  Have Borders that are more ragged.  Have more than one color (red, white or blue) in addition to brown or black.  Are usually bigger. Years of sun exposure increases the risk for getting melanoma. It is important to look at your moles regularly so that you can notice any changes that may occur within anyone of them that make it different from your other moles on your body. Know the A, B, C, D's of your moles  a mole is more worrisome for a melanoma if: A: It is Asymmetrical (one half is different from the other half). B: The Borders are jagged, etched or irregular. C: The Color is uneven (shades of brown, tan and/or black).  D: The Diameter is greater than a quarter of an inch (6 mm). SEEK MEDICAL CARE IF:  Your mole bleeds or becomes an open sore or does not heal.  Your mole grows rapidly or changes color.  Your mole becomes irregular and bumpy.  Your mole becomes painful, itchy, tender or sensitive. Document Released: 12/02/2004 Document Revised: 01/17/2012 Document Reviewed: 10/11/2008 [facility] Patient  Information 2014 [place], [place]. Chronic Obstructive Pulmonary Disease Chronic obstructive pulmonary disease (COPD) is a condition in which airflow from the lungs is restricted. The lungs can never return to normal, but there are measures you can take which will improve them and make you feel better. CAUSES   Smoking.  Exposure to secondhand smoke.  Breathing in irritants such as air pollution, dust, cigarette smoke, strong odors, aerosol sprays, or paint fumes.  History of lung infections. SYMPTOMS   Deep, persistent (chronic) cough with a large amount of thick mucus.  Wheezing.  Shortness of breath, especially with physical activity.  Feeling like you cannot get enough air.  Difficulty breathing.  Rapid breaths (tachypnea).  Gray or bluish discoloration (cyanosis) of the skin, especially in fingers, toes, or lips.  Fatigue.  Weight loss.  Swelling in legs, ankles, or feet.  Fast heartbeat (tachycardia).  Frequent lung infections.   Chest tightness. DIAGNOSIS  Initial diagnosis may be based on your history, symptoms, and physical examination. Additional tests for COPD may include:  Chest X-ray.  Computed tomography (CT) scan.  Lung (pulmonary) function tests.  Blood tests. TREATMENT  Treatment focuses on making you comfortable (supportive care). Your caregiver may prescribe medicines (inhaled or pills) to help improve your breathing. Additional treatment options may include oxygen therapy and pulmonary rehabilitation. Treatment should also include reducing your exposure to known irritants and following a plan to stop smoking. HOME CARE INSTRUCTIONS   Take all  medicines, including antibiotic medicines, as directed by your caregiver.  Use inhaled medicines as directed by your caregiver.  Avoid medicines or cough syrups that dry up your airway (antihistamines) and slow down the elimination of secretions. This decreases respiratory capacity and may lead to  infections.  If you smoke, stop smoking.  Avoid exposure to smoke, chemicals, and fumes that aggravate your breathing.  Avoid contact with individuals that have a contagious illness.  Avoid extreme temperature and humidity changes.  Use humidifiers at home and at your bedside if they do not make breathing difficult.  Drink enough water and fluids to keep your urine clear or pale yellow. This loosens secretions.  Eat healthy foods. Eating smaller, more frequent meals and resting before meals may help you maintain your strength.  Ask your caregiver about the use of vitamins and mineral supplements.  Stay active. Exercise and physical activity will help maintain your ability to do things you want to do.  Balance activity with periods of rest.  Assume a position of comfort if you become short of breath.  Learn and use relaxation techniques.  Learn and use controlled breathing techniques as directed by your caregiver. Controlled breathing techniques include:  Pursed lip breathing. This breathing technique starts with breathing in (inhaling) through your nose for 1 second. Next, purse your lips as if you were going to whistle. Then breathe out (exhale) through the pursed lips for 2 seconds.  Diaphragmatic breathing. Start by putting one hand on your abdomen just above your waist. Inhale slowly through your nose. The hand on your abdomen should move out. Then exhale slowly through pursed lips. You should be able to feel the hand on your abdomen moving in as you exhale.  Learn and use controlled coughing to clear mucus from your lungs. Controlled coughing is a series of short, progressive coughs. The steps of controlled coughing are: 1. Lean your head slightly forward. 2. Breathe in deeply using diaphragmatic breathing. 3. Try to hold your breath for 3 seconds. 4. Keep your mouth slightly open while coughing twice. 5. Spit any mucus out into a tissue. 6. Rest and repeat the steps once  or twice as needed.  Receive all protective vaccines your caregiver suggests, especially pneumococcal and influenza vaccines.  Learn to manage stress.  Schedule and attend all follow-up appointments as directed by your caregiver. It is important to keep all your appointments.  Participate in pulmonary rehabilitation as directed by your caregiver.  Use home oxygen as suggested. SEEK MEDICAL CARE IF:   You are coughing up more mucus than usual.  There is a change in the color or thickness of the mucus.  Breathing is more labored than usual.  Your breathing is faster than usual.  Your skin color is more cyanotic than usual.  You are running out of the medicine you take for your breathing.  You are anxious, apprehensive, or restless.  You have a fever. SEEK IMMEDIATE MEDICAL CARE IF:   You have a rapid heart rate.  You have shortness of breath while you are resting.  You have shortness of breath that prevents you from being able to talk.  You have shortness of breath that prevents you from performing your usual physical activities.  You have chest pain lasting longer than 5 minutes.  You have a seizure.  Your family or friends notice that you are agitated or confused. MAKE SURE YOU:   Understand these instructions.  Will watch your condition.  Will get help right  away if you are not doing well or get worse. Document Released: 08/04/2005 Document Revised: 07/19/2012 Document Reviewed: 12/25/2010 [facility] Patient Information 2014 [place], [place]. Smoking Cessation, Tips for Success YOU CAN QUIT SMOKING If you are ready to quit smoking, congratulations! You have chosen to help yourself be healthier. Cigarettes bring nicotine, tar, carbon monoxide, and other irritants into your body. Your lungs, heart, and blood vessels will be able to work better without these poisons. There are many different ways to quit smoking. Nicotine gum, nicotine patches, a nicotine inhaler, or  nicotine nasal spray can help with physical craving. Hypnosis, support groups, and medicines help break the habit of smoking. Here are some tips to help you quit for good.  Throw away all cigarettes.  Clean and remove all ashtrays from your home, work, and car.  On a card, write down your reasons for quitting. Carry the card with you and read it when you get the urge to smoke.  Cleanse your body of nicotine. Drink enough water and fluids to keep your urine clear or pale yellow. Do this after quitting to flush the nicotine from your body.  Learn to predict your moods. Do not let a bad situation be your excuse to have a cigarette. Some situations in your life might tempt you into wanting a cigarette.  Never have "just one" cigarette. It leads to wanting another and another. Remind yourself of your decision to quit.  Change habits associated with smoking. If you smoked while driving or when feeling stressed, try other activities to replace smoking. Stand up when drinking your coffee. Brush your teeth after eating. Sit in a different chair when you read the paper. Avoid alcohol while trying to quit, and try to drink fewer caffeinated beverages. Alcohol and caffeine may urge you to smoke.  Avoid foods and drinks that can trigger a desire to smoke, such as sugary or spicy foods and alcohol.  Ask people who smoke not to smoke around you.  Have something planned to do right after eating or having a cup of coffee. Take a walk or exercise to perk you up. This will help to keep you from overeating.  Try a relaxation exercise to calm you down and decrease your stress. Remember, you may be tense and nervous for the first 2 weeks after you quit, but this will pass.  Find new activities to keep your hands busy. Play with a pen, coin, or rubber band. Doodle or draw things on paper.  Brush your teeth right after eating. This will help cut down on the craving for the taste of tobacco after meals. You can try  mouthwash, too.  Use oral substitutes, such as lemon drops, carrots, a cinnamon stick, or chewing gum, in place of cigarettes. Keep them handy so they are available when you have the urge to smoke.  When you have the urge to smoke, try deep breathing.  Designate your home as a nonsmoking area.  If you are a heavy smoker, ask your caregiver about a prescription for nicotine chewing gum. It can ease your withdrawal from nicotine.  Reward yourself. Set aside the cigarette money you save and buy yourself something nice.  Look for support from others. Join a support group or smoking cessation program. Ask someone at home or at work to help you with your plan to quit smoking.  Always ask yourself, "Do I need this cigarette or is this just a reflex?" Tell yourself, "Today, I choose not to smoke," or "I  do not want to smoke." You are reminding yourself of your decision to quit, even if you do smoke a cigarette. HOW WILL I FEEL WHEN I QUIT SMOKING?  The benefits of not smoking start within days of quitting.  You may have symptoms of withdrawal because your body is used to nicotine (the addictive substance in cigarettes). You may crave cigarettes, be irritable, feel very hungry, cough often, get headaches, or have difficulty concentrating.  The withdrawal symptoms are only temporary. They are strongest when you first quit but will go away within 10 to 14 days.  When withdrawal symptoms occur, stay in control. Think about your reasons for quitting. Remind yourself that these are signs that your body is healing and getting used to being without cigarettes.  Remember that withdrawal symptoms are easier to treat than the major diseases that smoking can cause.  Even after the withdrawal is over, expect periodic urges to smoke. However, these cravings are generally short-lived and will go away whether you smoke or not. Do not smoke!  If you relapse and smoke again, do not lose hope. Most smokers quit 3  times before they are successful.  If you relapse, do not give up! Plan ahead and think about what you will do the next time you get the urge to smoke. LIFE AS A NONSMOKER: MAKE IT FOR A MONTH, MAKE IT FOR LIFE Day 1: Hang this page where you will see it every day. Day 2: Get rid of all ashtrays, matches, and lighters. Day 3: Drink water. Breathe deeply between sips. Day 4: Avoid places with smoke-filled air, such as bars, clubs, or the smoking section of restaurants. Day 5: Keep track of how much money you save by not smoking. Day 6: Avoid boredom. Keep a good book with you or go to the movies. Day 7: Reward yourself! One week without smoking! Day 8: Make a dental appointment to get your teeth cleaned. Day 9: Decide how you will turn down a cigarette before it is offered to you. Day 10: Review your reasons for quitting. Day 11: Distract yourself. Stay active to keep your mind off smoking and to relieve tension. Take a walk, exercise, read a book, do a crossword puzzle, or try a new hobby. Day 12: Exercise. Get off the bus before your stop or use stairs instead of escalators. Day 13: Call on friends for support and encouragement. Day 14: Reward yourself! Two weeks without smoking! Day 15: Practice deep breathing exercises. Day 16: Bet a friend that you can stay a nonsmoker. Day 17: Ask to sit in nonsmoking sections of restaurants. Day 18: Hang up "No Smoking" signs. Day 19: Think of yourself as a nonsmoker. Day 20: Each morning, tell yourself you will not smoke. Day 21: Reward yourself! Three weeks without smoking! Day 22: Think of smoking in negative ways. Remember how it stains your teeth, gives you bad breath, and leaves you short of breath. Day 23: Eat a nutritious breakfast. Day 24:Do not relive your days as a smoker. Day 25: Hold a pencil in your hand when talking on the telephone. Day 26: Tell all your friends you do not smoke. Day 27: Think about how much better food  tastes. Day 28: Remember, one cigarette is one too many. Day 29: Take up a hobby that will keep your hands busy. Day 30: Congratulations! One month without smoking! Give yourself a big reward. Your caregiver can direct you to community resources or hospitals for support, which may include:  Group support.  Education.  Hypnosis.  Subliminal therapy. Document Released: 07/23/2004 Document Revised: 01/17/2012 Document Reviewed: 08/11/2009 [facility] Patient Information 2014 [place], [place]. Smoking Cessation Quitting smoking is important to your health and has many advantages. However, it is not always easy to quit since nicotine is a very addictive drug. Often times, people try 3 times or more before being able to quit. This document explains the best ways for you to prepare to quit smoking. Quitting takes hard work and a lot of effort, but you can do it. ADVANTAGES OF QUITTING SMOKING  You will live longer, feel better, and live better.  Your body will feel the impact of quitting smoking almost immediately.  Within 20 minutes, blood pressure decreases. Your pulse returns to its normal level.  After 8 hours, carbon monoxide levels in the blood return to normal. Your oxygen level increases.  After 24 hours, the chance of having a heart attack starts to decrease. Your breath, hair, and body stop smelling like smoke.  After 48 hours, damaged nerve endings begin to recover. Your sense of taste and smell improve.  After 72 hours, the body is virtually free of nicotine. Your bronchial tubes relax and breathing becomes easier.  After 2 to 12 weeks, lungs can hold more air. Exercise becomes easier and circulation improves.  The risk of having a heart attack, stroke, cancer, or lung disease is greatly reduced.  After 1 year, the risk of coronary heart disease is cut in half.  After 5 years, the risk of stroke falls to the same as a nonsmoker.  After 10 years, the risk of lung cancer is  cut in half and the risk of other cancers decreases significantly.  After 15 years, the risk of coronary heart disease drops, usually to the level of a nonsmoker.  If you are pregnant, quitting smoking will improve your chances of having a healthy baby.  The people you live with, especially any children, will be healthier.  You will have extra money to spend on things other than cigarettes. QUESTIONS TO THINK ABOUT BEFORE ATTEMPTING TO QUIT You may want to talk about your answers with your caregiver.  Why do you want to quit?  If you tried to quit in the past, what helped and what did not?  What will be the most difficult situations for you after you quit? How will you plan to handle them?  Who can help you through the tough times? Your family? Friends? A caregiver?  What pleasures do you get from smoking? What ways can you still get pleasure if you quit? Here are some questions to ask your caregiver:  How can you help me to be successful at quitting?  What medicine do you think would be best for me and how should I take it?  What should I do if I need more help?  What is smoking withdrawal like? How can I get information on withdrawal? GET READY  Set a quit date.  Change your environment by getting rid of all cigarettes, ashtrays, matches, and lighters in your home, car, or work. Do not let people smoke in your home.  Review your past attempts to quit. Think about what worked and what did not. GET SUPPORT AND ENCOURAGEMENT You have a better chance of being successful if you have help. You can get support in many ways.  Tell your family, friends, and co-workers that you are going to quit and need their support. Ask them not to smoke around you.  Get individual, group, or telephone counseling and support. Programs are available at [employer] and health centers. Call your local health department for information about programs in your area.  Spiritual beliefs and  practices may help some smokers quit.  Download a "quit meter" on your computer to keep track of quit statistics, such as how long you have gone without smoking, cigarettes not smoked, and money saved.  Get a self-help book about quitting smoking and staying off of tobacco. LEARN NEW SKILLS AND BEHAVIORS  Distract yourself from urges to smoke. Talk to someone, go for a walk, or occupy your time with a task.  Change your normal routine. Take a different route to work. Drink tea instead of coffee. Eat breakfast in a different place.  Reduce your stress. Take a hot bath, exercise, or read a book.  Plan something enjoyable to do every day. Reward yourself for not smoking.  Explore interactive web-based programs that specialize in helping you quit. GET MEDICINE AND USE IT CORRECTLY Medicines can help you stop smoking and decrease the urge to smoke. Combining medicine with the above behavioral methods and support can greatly increase your chances of successfully quitting smoking.  Nicotine replacement therapy helps deliver nicotine to your body without the negative effects and risks of smoking. Nicotine replacement therapy includes nicotine gum, lozenges, inhalers, nasal sprays, and skin patches. Some may be available over-the-counter and others require a prescription.  Antidepressant medicine helps people abstain from smoking, but how this works is unknown. This medicine is available by prescription.  Nicotinic receptor partial agonist medicine simulates the effect of nicotine in your brain. This medicine is available by prescription. Ask your caregiver for advice about which medicines to use and how to use them based on your health history. Your caregiver will tell you what side effects to look out for if you choose to be on a medicine or therapy. Carefully read the information on the package. Do not use any other product containing nicotine while using a nicotine replacement product.  RELAPSE  OR DIFFICULT SITUATIONS Most relapses occur within the first 3 months after quitting. Do not be discouraged if you start smoking again. Remember, most people try several times before finally quitting. You may have symptoms of withdrawal because your body is used to nicotine. You may crave cigarettes, be irritable, feel very hungry, cough often, get headaches, or have difficulty concentrating. The withdrawal symptoms are only temporary. They are strongest when you first quit, but they will go away within 10 14 days. To reduce the chances of relapse, try to:  Avoid drinking alcohol. Drinking lowers your chances of successfully quitting.  Reduce the amount of caffeine you consume. Once you quit smoking, the amount of caffeine in your body increases and can give you symptoms, such as a rapid heartbeat, sweating, and anxiety.  Avoid smokers because they can make you want to smoke.  Do not let weight gain distract you. Many smokers will gain weight when they quit, usually less than 10 pounds. Eat a healthy diet and stay active. You can always lose the weight gained after you quit.  Find ways to improve your mood other than smoking. FOR MORE INFORMATION  www.smokefree.gov  Document Released: 10/19/2001 Document Revised: 04/25/2012 Document Reviewed: 02/03/2012 [facility] Patient Information 2014 [place], [place].  Varenicline oral tablets What is this medicine? VARENICLINE (var EN i kleen) is used to help people quit smoking. It can reduce the symptoms caused by stopping smoking. It is used with a patient  support program recommended by your physician. This medicine may be used for other purposes; ask your health care provider or pharmacist if you have questions. What should I tell my health care provider before I take this medicine? They need to know if you have any of these conditions: -bipolar disorder, depression, schizophrenia or other mental illness -heart disease -kidney disease -peripheral  vascular disease -stroke -suicidal thoughts, plans, or attempt; a previous suicide attempt by you or a family member -an unusual or allergic reaction to varenicline, other medicines, foods, dyes, or preservatives -pregnant or trying to get pregnant -breast-feeding How should I use this medicine? You should set a date to stop smoking and tell your doctor. Start this medicine one week before the quit date. You can also start taking this medicine before you choose a quit date, and then pick a quit date that is between 8 and 35 days of treatment with this medicine. Stick to your plan; ask about support groups or other ways to help you remain a 'quitter'. Take this medicine by mouth after eating. Take with a full glass of water. Follow the directions on the prescription label. Take your doses at regular intervals. Do not take your medicine more often than directed. A special MedGuide will be given to you by the pharmacist with each prescription and refill. Be sure to read this information carefully each time. Talk to your pediatrician regarding the use of this medicine in children. This medicine is not approved for use in children. Overdosage: If you think you have taken too much of this medicine contact a poison control center or emergency room at once. NOTE: This medicine is only for you. Do not share this medicine with others. What if I miss a dose? If you miss a dose, take it as soon as you can. If it is almost time for your next dose, take only that dose. Do not take double or extra doses. What may interact with this medicine? -insulin -other stop smoking aids -theophylline -warfarin This list may not describe all possible interactions. Give your health care provider a list of all the medicines, herbs, non-prescription drugs, or dietary supplements you use. Also tell them if you smoke, drink alcohol, or use illegal drugs. Some items may interact with your medicine. What should I watch for while  using this medicine? Visit your doctor or health care professional for regular check ups. Ask for ongoing advice and encouragement from your doctor or healthcare professional, friends, and family to help you quit. If you smoke while on this medication, quit again Your mouth may get dry. Chewing sugarless gum or sucking hard candy, and drinking plenty of water may help. Contact your doctor if the problem does not go away or is severe. You may get drowsy or dizzy. Do not drive, use machinery, or do anything that needs mental alertness until you know how this medicine affects you. Do not stand or sit up quickly, especially if you are an older patient. This reduces the risk of dizzy or fainting spells. The use of this medicine may increase the chance of suicidal thoughts or actions. Pay special attention to how you are responding while on this medicine. Any worsening of mood, or thoughts of suicide or dying should be reported to your health care professional right away. What side effects may I notice from receiving this medicine? Side effects that you should report to your doctor or health care professional as soon as possible: -allergic reactions like skin rash, itching  or hives, swelling of the face, lips, tongue, or throat -breathing problems -changes in vision -chest pain or chest tightness -confusion, trouble speaking or understanding -fast, irregular heartbeat -feeling faint or lightheaded, falls -fever -pain in legs when walking -problems with balance, talking, walking -ringing in ears -sudden numbness or weakness of the face, arm or leg -suicidal thoughts or other mood changes -trouble passing urine or change in the amount of urine -unusual bleeding or bruising -unusually weak or tired Side effects that usually do not require medical attention (report to your doctor or health care professional if they continue or are bothersome): -constipation -headache -nausea, vomiting -strange  dreams -stomach gas -trouble sleeping This list may not describe all possible side effects. Call your doctor for medical advice about side effects. You may report side effects to FDA at 1-800-FDA-1088. Where should I keep my medicine? Keep out of the reach of children. Store at room temperature between 15 and 30 degrees C (59 and 86 degrees F). Throw away any unused medicine after the expiration date. NOTE: This sheet is a summary. It may not cover all possible information. If you have questions about this medicine, talk to your doctor, pharmacist, or health care provider.  2013, Elsevier/Gold Standard. (06/01/2010 3:12:38 PM)

## 2013-04-19 NOTE — Addendum Note (Signed)
Addended by: Cleora Fleet on: 04/19/2013 10:17 AM   Modules accepted: Orders

## 2013-04-19 NOTE — Progress Notes (Signed)
Patient present for hospital follow up from last week due to COPD and arrhythmia. States shortness of breath and fatigue when ambulating. Also patient states she smoke 5 cigarettes a day and wants a prescription for chantix to quit. Also; patient states there is a black spot on her skin, at bra line and left side of back.

## 2013-04-24 ENCOUNTER — Telehealth: Payer: Self-pay | Admitting: Family Medicine

## 2013-04-24 NOTE — Telephone Encounter (Signed)
Pt received nebulizer Dr Laural Benes ordered but did not receive script for medication to put into Nebulizer. Please f/u with pt.

## 2013-04-25 ENCOUNTER — Other Ambulatory Visit: Payer: Self-pay | Admitting: Internal Medicine

## 2013-04-25 MED ORDER — ALBUTEROL SULFATE (5 MG/ML) 0.5% IN NEBU: 2.5000 mg | INHALATION_SOLUTION | Freq: Four times a day (QID) | RESPIRATORY_TRACT | Status: DC | PRN

## 2013-05-01 ENCOUNTER — Encounter (HOSPITAL_COMMUNITY): Payer: Self-pay

## 2013-05-01 ENCOUNTER — Emergency Department (HOSPITAL_COMMUNITY): Payer: BC Managed Care – PPO

## 2013-05-01 ENCOUNTER — Emergency Department (HOSPITAL_COMMUNITY)
Admission: EM | Admit: 2013-05-01 | Discharge: 2013-05-01 | Disposition: A | Discharge status: Home / Self Care | Payer: BC Managed Care – PPO | Attending: Emergency Medicine | Admitting: Emergency Medicine

## 2013-05-01 DIAGNOSIS — Z79899 Other long term (current) drug therapy: Secondary | ICD-10-CM | POA: Insufficient documentation

## 2013-05-01 DIAGNOSIS — R5383 Other fatigue: Secondary | ICD-10-CM | POA: Insufficient documentation

## 2013-05-01 DIAGNOSIS — F329 Major depressive disorder, single episode, unspecified: Secondary | ICD-10-CM | POA: Insufficient documentation

## 2013-05-01 DIAGNOSIS — F17200 Nicotine dependence, unspecified, uncomplicated: Secondary | ICD-10-CM | POA: Insufficient documentation

## 2013-05-01 DIAGNOSIS — R002 Palpitations: Secondary | ICD-10-CM

## 2013-05-01 DIAGNOSIS — R209 Unspecified disturbances of skin sensation: Secondary | ICD-10-CM | POA: Insufficient documentation

## 2013-05-01 DIAGNOSIS — F3289 Other specified depressive episodes: Secondary | ICD-10-CM | POA: Insufficient documentation

## 2013-05-01 DIAGNOSIS — J449 Chronic obstructive pulmonary disease, unspecified: Secondary | ICD-10-CM | POA: Insufficient documentation

## 2013-05-01 DIAGNOSIS — R5381 Other malaise: Secondary | ICD-10-CM | POA: Insufficient documentation

## 2013-05-01 DIAGNOSIS — J4489 Other specified chronic obstructive pulmonary disease: Secondary | ICD-10-CM | POA: Insufficient documentation

## 2013-05-01 LAB — CBC WITH DIFFERENTIAL/PLATELET
Basophils Relative: 0 % (ref 0–1)
Eosinophils Absolute: 0.2 10*3/uL (ref 0.0–0.7)
HCT: 37.1 % (ref 36.0–46.0)
Hemoglobin: 12.5 g/dL (ref 12.0–15.0)
Lymphs Abs: 1 10*3/uL (ref 0.7–4.0)
MCH: 31.3 pg (ref 26.0–34.0)
MCHC: 33.7 g/dL (ref 30.0–36.0)
MCV: 92.8 fL (ref 78.0–100.0)
Monocytes Absolute: 0.5 10*3/uL (ref 0.1–1.0)
Monocytes Relative: 8 % (ref 3–12)
Neutrophils Relative %: 70 % (ref 43–77)
RBC: 4 MIL/uL (ref 3.87–5.11)

## 2013-05-01 LAB — BASIC METABOLIC PANEL
BUN: 12 mg/dL (ref 6–23)
Creatinine, Ser: 0.65 mg/dL (ref 0.50–1.10)
GFR calc non Af Amer: >90 mL/min (ref >90)
Glucose, Bld: 84 mg/dL (ref 70–99)
Potassium: 4.6 mEq/L (ref 3.5–5.1)

## 2013-05-01 NOTE — ED Provider Notes (Signed)
History    CSN: [id number] Arrival date & time 05/01/13  1053  First MD Initiated Contact with Patient 05/01/13 1124     Chief Complaint  Patient presents with  . Numbness    outside of lt arm and lt leg x2 days  . Palpitations    x50month   (Consider location/radiation/quality/duration/timing/severity/associated sxs/prior Treatment) HPI  Lori Owens is a 52 y.o.female with a significant PMH of COPD, depression, tobacco abuse, headaches  presents to the ER with complaints of heart palpitations and fatigue. She also complaints of intermittent numbness to her left ring and little finger without weakness or pain. Patient started having palpitations two months ago, it was determined that her COPD medications were the cause. She has now been off of her new COPD medications for a week and is continuing to have the palpitations multiple times per day. She describes being awoken out of her sleep every 2-3 hours with her heart racing. At her last visit a rod of ventricular tachycardia was caught on cardiac monitor. She has not seen a cardiologist for this problem. Describes being chronically tired and needs to fall asleep multiple times per day. He denies having any chest pain, shortness of breath, nausea, vomiting, diaphoresis, confusion.  Past Medical History  Diagnosis Date  . Depression 01/18/2013  . Tobacco abuse 01/18/2013  . Headache(784.0)   . COPD (chronic obstructive pulmonary disease)    Past Surgical History  Procedure Laterality Date  . Throat surgery    . Abdominal hysterectomy    . Nose surgery      polyp removal from nasal cavity   . Tonsillectomy     Family History  Problem Relation Age of Onset  . Diabetes Maternal Grandfather   . Heart disease Maternal Grandfather   . Cancer Maternal Grandfather   . Hypertension Maternal Grandfather    History  Substance Use Topics  . Smoking status: Current Every Day Smoker    Last Attempt to Quit: 01/18/2013  . Smokeless  tobacco: Never Used  . Alcohol Use: No   OB History   Grav Para Term Preterm Abortions TAB SAB Ect Mult Living                 Review of Systems  Constitutional: Positive for fatigue.  Cardiovascular: Positive for palpitations.  All other systems reviewed and are negative.    Allergies  Review of patient's allergies indicates no known allergies.  Home Medications   Current Outpatient Rx  Name  Route  Sig  Dispense  Refill  . albuterol (PROVENTIL HFA;VENTOLIN HFA) 108 (90 BASE) MCG/ACT inhaler   Inhalation   Inhale 2 puffs into the lungs every 6 (six) hours as needed for wheezing or shortness of breath.   1 Inhaler   5   . albuterol (PROVENTIL) (5 MG/ML) 0.5% nebulizer solution   Nebulization   Take 0.5 mLs (2.5 mg total) by nebulization every 6 (six) hours as needed for wheezing.   20 mL   12   . mometasone-formoterol (DULERA) 100-5 MCG/ACT AERO   Inhalation   Inhale 2 puffs into the lungs 2 (two) times daily.   1 Inhaler   0   . PARoxetine (PAXIL) 40 MG tablet   Oral   Take 1 tablet (40 mg total) by mouth every morning.   30 tablet   5   . predniSONE (DELTASONE) 20 MG tablet      40 mg x3 days, 30 mg x 3 days, 20 mg x  3 days, 10 mg x 3 days   30 tablet   0    BP 113/78  Pulse 93  Temp(Src) 99.1 F (37.3 C) (Oral)  Resp 20  SpO2 97% Physical Exam  Nursing note and vitals reviewed. Constitutional: She appears well-developed and well-nourished. No distress.  HENT:  Head: Normocephalic and atraumatic.  Eyes: Pupils are equal, round, and reactive to light.  Neck: Normal range of motion. Neck supple.  Cardiovascular: Normal rate, regular rhythm and normal heart sounds.   Pulmonary/Chest: Effort normal. No respiratory distress. She has no wheezes.  Abdominal: Soft.  Neurological: She is alert.  Skin: Skin is warm and dry.    ED Course  Procedures (including critical care time) Labs Reviewed  TROPONIN I  CBC WITH DIFFERENTIAL  BASIC METABOLIC  PANEL  MAGNESIUM   Dg Chest 2 View  05/01/2013   *RADIOLOGY REPORT*  Clinical Data: Palpitations, COPD and numbness and tingling in the left hand and fingers  CHEST - 2 VIEW  Comparison: 04/09/2013  Findings: The heart, mediastinum and hila are within normal limits. The lungs are clear.  The bony thorax is intact.  There is been no change.  IMPRESSION: No active disease of the chest.   Original Report Authenticated By: Amie Portland, M.D.   1. Heart palpitations     MDM  Discussed case with Dr. Bernette Mayers, will order basic labs and consult Cardiology.   Date: 05/01/2013  Rate:83  Rhythm:  sinus rhythm  QRS Axis: normal  Intervals: normal  ST/T Wave abnormalities: normal  Conduction Disutrbances:none  Narrative Interpretation:   Old EKG Reviewed: unchanged April 09, 2013    2:30pm - work up is unremarkable. NO runs of V-tach in the ED. Discussed case with Dr. Donnie Aho who says the patient can be managed as an outpatient. He recommends she call his office to schedule an appointment today and that he would be more than happy to see her for further eval.   52 y.o.[name]'s evaluation in the Emergency Department is complete. It has been determined that no acute conditions requiring further emergency intervention are present at this time. The patient/guardian have been advised of the diagnosis and plan. We have discussed signs and symptoms that warrant return to the ED, such as changes or worsening in symptoms.  Vital signs are stable at discharge. Filed Vitals:   05/01/13 1413  BP: 113/78  Pulse: 93  Temp:   Resp: 20    Patient/guardian has voiced understanding and agreed to follow-up with the PCP or specialist.    [name], PA-C 05/01/13 1440

## 2013-05-01 NOTE — ED Notes (Signed)
Pt c/o numbness to the outside of lt arm and lt leg, palpation, decreased appetite, trouble sleeping off and on for the past month. Pt sates hx of COPD and was on prednisone x20 days and they stopped that thinking that was causing her problems but has off x4days and nothing has got better.

## 2013-05-01 NOTE — ED Notes (Signed)
Bed:WA05<BR> Expected date:<BR> Expected time:<BR> Means of arrival:<BR> Comments:<BR>

## 2013-05-01 NOTE — ED Notes (Signed)
Pt denies chest pain, diaphoresis or nausea at this time

## 2013-05-01 NOTE — ED Provider Notes (Signed)
Medical screening examination/treatment/procedure(s) were performed by non-physician practitioner and as supervising physician I was immediately available for consultation/collaboration.   Charles B. Bernette Mayers, MD 05/01/13 [phone]

## 2013-05-01 NOTE — ED Notes (Signed)
Patient transported to X-ray 

## 2013-05-03 ENCOUNTER — Ambulatory Visit: Payer: PRIVATE HEALTH INSURANCE

## 2013-05-22 ENCOUNTER — Encounter (INDEPENDENT_AMBULATORY_CARE_PROVIDER_SITE_OTHER): Payer: Self-pay | Admitting: General Surgery

## 2013-05-22 ENCOUNTER — Ambulatory Visit (INDEPENDENT_AMBULATORY_CARE_PROVIDER_SITE_OTHER): Payer: BC Managed Care – PPO | Admitting: General Surgery

## 2013-05-22 VITALS — BP 118/78 | HR 80 | Temp 98.6°F | Resp 16 | Ht 66.0 in | Wt 204.4 lb

## 2013-05-22 DIAGNOSIS — C439 Malignant melanoma of skin, unspecified: Secondary | ICD-10-CM

## 2013-05-22 NOTE — Progress Notes (Signed)
Patient ID: Lori Owens, female   DOB: [birth year], 52 y.o.   MRN: [id number]  Chief Complaint  Patient presents with  . New Evaluation    Level 4 melanoma on back    HPI Lori Owens is a 52 y.o. female.  The patient is a 52 year old female who recently underwent a shave biopsy of a left back lesion. This turned out to be melanoma with a 0.74 mm thickness. This had no symptoms. This. She states this lesion had been there for approximately 10 years but has increased in size.  HPI  Past Medical History  Diagnosis Date  . Depression 01/18/2013  . Tobacco abuse 01/18/2013  . Headache(784.0)   . COPD (chronic obstructive pulmonary disease)   . Heart murmur     Past Surgical History  Procedure Laterality Date  . Throat surgery    . Abdominal hysterectomy    . Nose surgery      polyp removal from nasal cavity   . Tonsillectomy      Family History  Problem Relation Age of Onset  . Diabetes Maternal Grandfather   . Heart disease Maternal Grandfather   . Cancer Maternal Grandfather   . Hypertension Maternal Grandfather     Social History History  Substance Use Topics  . Smoking status: Current Every Day Smoker -- 0.50 packs/day    Last Attempt to Quit: 01/18/2013  . Smokeless tobacco: Never Used  . Alcohol Use: No    No Known Allergies  Current Outpatient Prescriptions  Medication Sig Dispense Refill  . albuterol (PROVENTIL HFA;VENTOLIN HFA) 108 (90 BASE) MCG/ACT inhaler Inhale 2 puffs into the lungs every 6 (six) hours as needed for wheezing or shortness of breath.  1 Inhaler  5  . albuterol (PROVENTIL) (5 MG/ML) 0.5% nebulizer solution Take 0.5 mLs (2.5 mg total) by nebulization every 6 (six) hours as needed for wheezing.  20 mL  12  . mometasone-formoterol (DULERA) 100-5 MCG/ACT AERO Inhale 2 puffs into the lungs 2 (two) times daily.  1 Inhaler  0  . PARoxetine (PAXIL) 40 MG tablet Take 1 tablet (40 mg total) by mouth every morning.  30 tablet  5   No current  facility-administered medications for this visit.    Review of Systems Review of Systems  Constitutional: Negative.   HENT: Negative.   Eyes: Negative.   Respiratory: Negative.   Cardiovascular: Negative.   Gastrointestinal: Negative.   Neurological: Negative.   All other systems reviewed and are negative.    Blood pressure 118/78, pulse 80, temperature 98.6 F (37 C), temperature source Temporal, resp. rate 16, height 5\' 6"  (1.676 m), weight 204 lb 6.4 oz (92.715 kg).  Physical Exam Physical Exam  Constitutional: She is oriented to person, place, and time. She appears well-developed and well-nourished.  HENT:  Head: Normocephalic and atraumatic.  Eyes: Conjunctivae and EOM are normal. Pupils are equal, round, and reactive to light.  Neck: Normal range of motion. Neck supple.  Cardiovascular: Normal rate, regular rhythm, normal heart sounds and intact distal pulses.   Pulmonary/Chest: Effort normal and breath sounds normal.    Abdominal: Soft. Bowel sounds are normal.  Lymphadenopathy:    She has no axillary adenopathy.       Right: No inguinal and no supraclavicular adenopathy present.       Left: No inguinal and no supraclavicular adenopathy present.  Neurological: She is alert and oriented to person, place, and time.    Data Reviewed [place] results with a depth of 0.74  mm  Assessment    52 year old female with melanoma on biopsy.     Plan    1. We'll proceed to the operating room for wide local excision of 1 cm. Due to thicknesses of this does not need a sentinel lymph node biopsy. 2. Discussed with her the risks and benefits of surgery to include: Infection, bleeding, damage to other structures, and possible wound dehiscence. The patient agreed and wished to proceed with the procedure.        Marigene Ehlers., Dyasia Firestine 05/22/2013, 11:36 AM

## 2013-05-23 ENCOUNTER — Encounter (INDEPENDENT_AMBULATORY_CARE_PROVIDER_SITE_OTHER): Payer: Self-pay

## 2013-05-24 ENCOUNTER — Ambulatory Visit: Payer: PRIVATE HEALTH INSURANCE

## 2013-05-25 ENCOUNTER — Telehealth (INDEPENDENT_AMBULATORY_CARE_PROVIDER_SITE_OTHER): Payer: Self-pay | Admitting: *Deleted

## 2013-05-25 NOTE — Telephone Encounter (Signed)
Patient called to ask for a referral to the cancer center so that she may obtain a 2nd opinion.  Explained to patient that this request will be sent to Dr. Derrell Lolling for approval and we will let her know. Patient states understanding at this time.

## 2013-05-28 ENCOUNTER — Other Ambulatory Visit (INDEPENDENT_AMBULATORY_CARE_PROVIDER_SITE_OTHER): Payer: Self-pay | Admitting: General Surgery

## 2013-05-28 DIAGNOSIS — C439 Malignant melanoma of skin, unspecified: Secondary | ICD-10-CM

## 2013-05-28 NOTE — Telephone Encounter (Signed)
Sure she can go to the cancer center.  Let her know that they are not the ones that would do surgery, but she can talk with them if she wants.

## 2013-05-28 NOTE — Telephone Encounter (Signed)
Referral to Carla's desk for pre-cert 1/61/09 @ 2:19

## 2013-05-28 NOTE — Telephone Encounter (Signed)
Referral entered in Epic and per Delice Bison over at Dr.Ennever's office referral was received in their work que..7/21 @ 2:09 and she stated that they would contact the patient for the appt.Lori KitchenMarland KitchenLMOM @ 2:15 05/28/13 for patient to call us back just to make sure that she understands even though she sees them for 2nd opinion doesn't mean they will be doing her surgery, we will still be the one's performing the surgery per verbal orders from AR

## 2013-05-29 ENCOUNTER — Encounter (INDEPENDENT_AMBULATORY_CARE_PROVIDER_SITE_OTHER): Payer: Self-pay

## 2013-05-29 ENCOUNTER — Telehealth (INDEPENDENT_AMBULATORY_CARE_PROVIDER_SITE_OTHER): Payer: Self-pay

## 2013-05-29 ENCOUNTER — Telehealth: Payer: Self-pay | Admitting: Hematology & Oncology

## 2013-05-29 NOTE — Telephone Encounter (Signed)
Contacted patient to let her know that the referral was in and that Dr.Ennever's office should be calling to set up an appt with her but after speaking with the patient she was just wanting to know more about AR...she didn't know anything about him like how long he had been Careers adviser and just general questions .Marland KitchenMarland KitchenI answered her questions and let her know that her surgery was still schd for 8/11 with AR and she could either make appt with Dr.Ennever or decline once they call her to schd it, it was totally up to her...patient verbalized agreement and would like to just proceed with surgery by AR

## 2013-05-29 NOTE — Telephone Encounter (Signed)
Rick at Dr Gustavo Lah office called wanting to let Lawson Fiscal know that pt declined appt with Dr Myna Hidalgo. Pt requests appt in [place] location. Raiford Noble has sent request to Tiffany at [facility] in [place] to make appt for pt to be seen at their location. Raiford Noble advised I will send this msg to Dr Derrell Lolling and Lawson Fiscal.

## 2013-05-29 NOTE — Telephone Encounter (Signed)
Called to schedule pt mother stated she wanted to go to [facility]. Tiffany is aware of pt. I faxed chart to her. Cindy at referring office is aware pt wants to go to that location. Mother has number for Tiffany.

## 2013-05-30 ENCOUNTER — Telehealth: Payer: Self-pay | Admitting: Hematology and Oncology

## 2013-05-30 NOTE — Telephone Encounter (Signed)
LVOM FOR PT TO RETURN CALL IN RE NP APPT.  °

## 2013-05-31 ENCOUNTER — Telehealth: Payer: Self-pay | Admitting: Hematology & Oncology

## 2013-05-31 NOTE — Telephone Encounter (Signed)
After talking with referring office and Dr. Myna Hidalgo pt is aware of 07-23-13 after her surgery on 8-11. Pt had wanted a sencond opinion before surgery but changed her mind. (per Lawson Fiscal in referring)

## 2013-06-18 ENCOUNTER — Other Ambulatory Visit (INDEPENDENT_AMBULATORY_CARE_PROVIDER_SITE_OTHER): Payer: Self-pay | Admitting: General Surgery

## 2013-06-18 DIAGNOSIS — C4359 Malignant melanoma of other part of trunk: Secondary | ICD-10-CM

## 2013-06-20 ENCOUNTER — Telehealth (INDEPENDENT_AMBULATORY_CARE_PROVIDER_SITE_OTHER): Payer: Self-pay | Admitting: General Surgery

## 2013-06-20 NOTE — Telephone Encounter (Signed)
LMOM letting pt know she has an appt with Dr. Derrell Lolling on 07/04/13 at 3:30 w/ arrival time of 3:15.

## 2013-06-21 ENCOUNTER — Telehealth (INDEPENDENT_AMBULATORY_CARE_PROVIDER_SITE_OTHER): Payer: Self-pay

## 2013-06-21 NOTE — Telephone Encounter (Signed)
Patient calling into office for pathology results.  Results given to patient (No residual Melanoma)  Patient verbalized understanding

## 2013-07-04 ENCOUNTER — Encounter (INDEPENDENT_AMBULATORY_CARE_PROVIDER_SITE_OTHER): Payer: Self-pay | Admitting: General Surgery

## 2013-07-04 ENCOUNTER — Telehealth (INDEPENDENT_AMBULATORY_CARE_PROVIDER_SITE_OTHER): Payer: Self-pay

## 2013-07-04 ENCOUNTER — Ambulatory Visit (INDEPENDENT_AMBULATORY_CARE_PROVIDER_SITE_OTHER): Payer: BC Managed Care – PPO | Admitting: General Surgery

## 2013-07-04 VITALS — BP 138/76 | HR 84 | Temp 98.6°F | Resp 16 | Ht 66.0 in | Wt 210.6 lb

## 2013-07-04 DIAGNOSIS — Z85820 Personal history of malignant melanoma of skin: Secondary | ICD-10-CM

## 2013-07-04 DIAGNOSIS — Z9889 Other specified postprocedural states: Secondary | ICD-10-CM

## 2013-07-04 NOTE — Progress Notes (Signed)
Patient ID: Janifer Gieselman, female   DOB: [birth year], 52 y.o.   MRN: [id number] The patient is a 52 year old female status post excision of previous melanoma biopsy. Patient has been doing well postoperatively and has no complaints at this time.  Pathology: Reveals no residual melanoma.  As discussed with the patient.  On exam: Her wounds clean dry and intact  Assessment and plan:  52 year old female status post melanoma excision. 1. Patient returned to work 2. Patient is to follow up with her dermatologist/PCP as needed and scheduled. 3. The patient followup with me as needed.

## 2013-07-04 NOTE — Telephone Encounter (Signed)
Called patient and left message to call our office RE:  See if patient would like to come in early for her appointment today w/Dr. Derrell Lolling.

## 2013-07-20 ENCOUNTER — Telehealth: Payer: Self-pay | Admitting: Hematology & Oncology

## 2013-07-20 NOTE — Telephone Encounter (Signed)
Pt moved 9-15 to 10-9 said needed to work

## 2013-07-23 ENCOUNTER — Other Ambulatory Visit: Payer: PRIVATE HEALTH INSURANCE | Admitting: Lab

## 2013-07-23 ENCOUNTER — Ambulatory Visit: Payer: PRIVATE HEALTH INSURANCE | Admitting: Hematology & Oncology

## 2013-07-23 ENCOUNTER — Ambulatory Visit: Payer: PRIVATE HEALTH INSURANCE

## 2013-08-07 ENCOUNTER — Encounter (INDEPENDENT_AMBULATORY_CARE_PROVIDER_SITE_OTHER): Payer: Self-pay

## 2013-08-16 ENCOUNTER — Other Ambulatory Visit: Payer: PRIVATE HEALTH INSURANCE | Admitting: Lab

## 2013-08-16 ENCOUNTER — Ambulatory Visit (HOSPITAL_BASED_OUTPATIENT_CLINIC_OR_DEPARTMENT_OTHER): Payer: PRIVATE HEALTH INSURANCE | Admitting: Hematology & Oncology

## 2013-08-16 ENCOUNTER — Ambulatory Visit: Payer: PRIVATE HEALTH INSURANCE

## 2013-08-16 VITALS — BP 131/87 | HR 91 | Temp 98.0°F | Resp 16 | Ht 66.0 in | Wt 213.0 lb

## 2013-08-16 DIAGNOSIS — F17200 Nicotine dependence, unspecified, uncomplicated: Secondary | ICD-10-CM

## 2013-08-16 DIAGNOSIS — G8929 Other chronic pain: Secondary | ICD-10-CM

## 2013-08-16 DIAGNOSIS — R109 Unspecified abdominal pain: Secondary | ICD-10-CM

## 2013-08-16 DIAGNOSIS — C4359 Malignant melanoma of other part of trunk: Secondary | ICD-10-CM

## 2013-08-16 DIAGNOSIS — Z1239 Encounter for other screening for malignant neoplasm of breast: Secondary | ICD-10-CM

## 2013-08-16 DIAGNOSIS — J449 Chronic obstructive pulmonary disease, unspecified: Secondary | ICD-10-CM

## 2013-08-16 NOTE — Progress Notes (Signed)
This office note has been dictated.

## 2013-08-17 NOTE — Progress Notes (Signed)
CC:   Axel Filler, MD Elinor Parkinson. Worthy Rancher, M.D. 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  HISTORY OF PRESENT ILLNESS:  Patient is a very nice 52 year old white female.  She is originally from [place], [place].  She does have fair skin.  She did have a lot of sunburns when she was younger.  Her family noticed a mole on her left back.  This has been noted for years.  It became darker.  Her family finally got onto her about this. It was not hurting her.  It was not bleeding.  She did not have any other symptoms.  She does have some COPD.  She does smoke, but she is trying to cut back on this.  She sees Dr. Terri Piedra of Dermatology.  Dr. Terri Piedra went ahead and did a biopsy of this.  This was done on July 10th.  The pathology report ([id number]) showed a malignant melanoma.  This had a thickness of 0.74 mm.  There was no ulceration.  There was no perineural invasion.  It had a brisk lymphocyte response.  There was partial tumor regression.  All margins were unremarkable.  She was then referred to Dr. Derrell Lolling of surgery.  She underwent wide local excision on August 11th.  The pathology report ([id number]) showed no residual melanoma.  There is no firm indication for a lymph node biopsy.  Her last lab work done back in June looked okay.  She had normal electrolytes.  Liver function tests were fine.  She was kindly referred to the [facility] for an evaluation as to any additional therapy and management recommendations.  Again, she feels well.  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  She has had no cough or shortness of breath.  She does have occasional wheezing from this COPD.  There is no headache.  She has had no swallowing difficulties.  There is no nausea or vomiting.  She has chronic abdominal pain.  She has had a hysterectomy.  She  has some constipation issues.  She has not had a mammogram yet.  She has not had a colonoscopy in over 11 years.  She said that she had polyps back in 2003.  There has been no leg swelling.  Overall, her performance status is ECOG 0.  PAST MEDICAL HISTORY: 1. COPD. 2. Depression. 3. Abdominal hysterectomy for a prolapsed uterus. 4. Headaches. 5.  ALLERGIES:  None.  MEDICATIONS:  Proventil inhaler 2 puffs q.6h. p.r.n.  Dulera (100/5) 2 puffs daily q.12h.  Paxil 40 mg p.o. daily.  SOCIAL HISTORY:  Remarkable for tobacco use.  She probably has about a 25-pack-year history of tobacco use.  She notes that she has never smoked more than one pack per day.  She is trying to cut back.  There is no alcohol use.  She has no recreational drug use.  FAMILY HISTORY:  Negative for any melanoma.  There is no history of skin cancer in the family.  Her maternal grandfather died of lung cancer.  REVIEW OF SYSTEMS:  As stated in the history present illness.  No additional findings noted on a 12-system review.  PHYSICAL EXAMINATION:  General:  This is a fairly well-developed, well- nourished white female in no obvious distress.  Vital signs: Temperature of 98, pulse 91, respiratory rate 16, blood pressure 131/87. Weight is 214 pounds.  Head/neck:  Normocephalic,  atraumatic skull. There are no ocular or oral lesions.  There are no palpable cervical or supraclavicular lymph nodes.  Lungs:  Clear bilaterally.  She has no rales, wheezes, or rhonchi.  Cardiac:  Regular rate and rhythm with a normal S1 and S2.  There are no murmurs, rubs or bruits.  Abdomen: Soft.  She has good bowel sounds.  There is no palpable abdominal mass. There is no fluid wave.  She has a well-healed laparotomy scar.  Back: The wide local excision in the lateral left back.  This is under her bra strap.  This is well-healed.  There is no erythema or tenderness associated with this.  There is no tenderness over the spine, ribs,  or hips.  Extremities:  No clubbing, cyanosis or edema.  She has a good range motion of her joints.  She has good strength in her upper and lower extremities.  Axillary:  No bilateral axillary adenopathy.  Skin: No suspicious hyperpigmented lesions.  LABORATORY STUDIES:  A white cell count of 5.3, hemoglobin 12.5, hematocrit 37.1, platelet count 227.  IMPRESSION:  Ms. Farve is a very charming 52 year old white female. She has a very good prognosis of stage IA (T1A N0 M0) melanoma of the left back.  This was resected.  She has good prognostic features, mostly with no lymphovascular space invasion.  No ulceration and brisk lymphocyte response.  I believe that her risk of recurrence is going to be easily less than 10%.  She is really worried about not knowing if this is elsewhere.  I tried to reassure her that I did not think this was elsewhere.  However, I think that we are going to need to do a CT scan just to have this as a baseline and to give her some "peace of mind" that this is not spread.  I definitely see no indication for adjuvant therapy.  Again, she has a very good prognosis for her stage IA melanoma.  She definitely has risk factors for melanoma, that being fair skin, blue eyes, and frequent sunburns when she was younger.  She came in with her daughter and mom.  I answered all their questions. I spent a good hour or so with them.  She needs a mammogram.  We will see about getting this set up.  She also needs a colonoscopy, as she had a polyps that she said were removed in 2003.  She has not had a colonoscopy since.  I will see about referring her to Dr. Matthias Hughs of [facility] for a colonoscopy.  We will go ahead and plan to see her back in 3 months.  We will call her with the results of all her scans.  If all looks good in 3 months, then we probably can see her back every 4 months for the first year and then 6 months after  that.    ______________________________ Josph Macho, M.D. PRE/MEDQ  D:  08/16/2013  T:  08/17/2013  Job:  [id number]

## 2013-08-27 ENCOUNTER — Ambulatory Visit (HOSPITAL_BASED_OUTPATIENT_CLINIC_OR_DEPARTMENT_OTHER)
Admission: RE | Admit: 2013-08-27 | Discharge: 2013-08-27 | Disposition: A | Discharge status: Home / Self Care | Payer: BC Managed Care – PPO | Source: Ambulatory Visit | Attending: Hematology & Oncology | Admitting: Hematology & Oncology

## 2013-08-27 ENCOUNTER — Encounter (HOSPITAL_BASED_OUTPATIENT_CLINIC_OR_DEPARTMENT_OTHER): Payer: Self-pay

## 2013-08-27 DIAGNOSIS — R599 Enlarged lymph nodes, unspecified: Secondary | ICD-10-CM | POA: Insufficient documentation

## 2013-08-27 DIAGNOSIS — Z1239 Encounter for other screening for malignant neoplasm of breast: Secondary | ICD-10-CM

## 2013-08-27 DIAGNOSIS — N289 Disorder of kidney and ureter, unspecified: Secondary | ICD-10-CM | POA: Insufficient documentation

## 2013-08-27 DIAGNOSIS — I2510 Atherosclerotic heart disease of native coronary artery without angina pectoris: Secondary | ICD-10-CM | POA: Insufficient documentation

## 2013-08-27 DIAGNOSIS — I700 Atherosclerosis of aorta: Secondary | ICD-10-CM | POA: Insufficient documentation

## 2013-08-27 DIAGNOSIS — C4359 Malignant melanoma of other part of trunk: Secondary | ICD-10-CM

## 2013-08-27 DIAGNOSIS — I2584 Coronary atherosclerosis due to calcified coronary lesion: Secondary | ICD-10-CM | POA: Insufficient documentation

## 2013-08-27 DIAGNOSIS — Z1231 Encounter for screening mammogram for malignant neoplasm of breast: Secondary | ICD-10-CM | POA: Insufficient documentation

## 2013-08-27 DIAGNOSIS — K5730 Diverticulosis of large intestine without perforation or abscess without bleeding: Secondary | ICD-10-CM | POA: Insufficient documentation

## 2013-08-27 MED ORDER — IOHEXOL 300 MG/ML  SOLN
100.0000 mL | Freq: Once | INTRAMUSCULAR | Status: AC | PRN
  Administered 2013-08-27: 100 mL via INTRAVENOUS

## 2013-08-28 ENCOUNTER — Telehealth: Payer: Self-pay | Admitting: Nurse Practitioner

## 2013-08-28 NOTE — Telephone Encounter (Addendum)
Message copied by Glee Arvin on Tue Aug 28, 2013  1:08 PM ------      Message from: Josph Macho      Created: Tue Aug 28, 2013  7:45 AM       Call - no melanoma!!  Cindee Lame ------ Spoke w/ Okey Regal Pt's mother and informed her of the test results. She verbalized understanding and appreciation.

## 2013-10-12 ENCOUNTER — Ambulatory Visit: Payer: BC Managed Care – PPO | Attending: Internal Medicine | Admitting: Internal Medicine

## 2013-10-12 ENCOUNTER — Encounter: Payer: Self-pay | Admitting: Internal Medicine

## 2013-10-12 VITALS — BP 133/88 | HR 76 | Temp 98.2°F | Resp 16 | Ht 66.0 in | Wt 214.0 lb

## 2013-10-12 DIAGNOSIS — J4489 Other specified chronic obstructive pulmonary disease: Secondary | ICD-10-CM | POA: Insufficient documentation

## 2013-10-12 DIAGNOSIS — G4700 Insomnia, unspecified: Secondary | ICD-10-CM | POA: Insufficient documentation

## 2013-10-12 DIAGNOSIS — Z1211 Encounter for screening for malignant neoplasm of colon: Secondary | ICD-10-CM

## 2013-10-12 DIAGNOSIS — J449 Chronic obstructive pulmonary disease, unspecified: Secondary | ICD-10-CM

## 2013-10-12 DIAGNOSIS — F17200 Nicotine dependence, unspecified, uncomplicated: Secondary | ICD-10-CM

## 2013-10-12 MED ORDER — HYDROXYZINE HCL 25 MG PO TABS: 25.0000 mg | ORAL_TABLET | Freq: Every evening | ORAL | Status: DC | PRN

## 2013-10-12 MED ORDER — ALBUTEROL SULFATE HFA 108 (90 BASE) MCG/ACT IN AERS: 2.0000 | INHALATION_SPRAY | Freq: Four times a day (QID) | RESPIRATORY_TRACT | Status: DC | PRN

## 2013-10-12 MED ORDER — FLUTICASONE-SALMETEROL 250-50 MCG/DOSE IN AEPB: 1.0000 | INHALATION_SPRAY | Freq: Two times a day (BID) | RESPIRATORY_TRACT | Status: DC

## 2013-10-12 MED ORDER — ALBUTEROL SULFATE (5 MG/ML) 0.5% IN NEBU: 2.5000 mg | INHALATION_SOLUTION | Freq: Four times a day (QID) | RESPIRATORY_TRACT | Status: DC | PRN

## 2013-10-12 NOTE — Progress Notes (Signed)
MRN: [id number] Name: Lori Owens  Sex: female Age: 52 y.o. DOB: [birth year]  Allergies: Review of patient's allergies indicates no known allergies.  Chief Complaint  Patient presents with  . Medication Refill    HPI: Patient is 52 y.o. female who has history of COPD, depression, tobacco abuse, comes today for medication refill denies any chest pain shortness of breath, Patient is on Advair and rarely uses albuterol.she is trying to quit smoking and is already on Chantix, patient is requesting some medication to help her with her with sleep. Patient is requesting that her to see GI for screening colonoscopy.   Past Medical History  Diagnosis Date  . Depression 01/18/2013  . Tobacco abuse 01/18/2013  . Headache(784.0)   . COPD (chronic obstructive pulmonary disease)   . Heart murmur   . Cancer     Past Surgical History  Procedure Laterality Date  . Throat surgery    . Abdominal hysterectomy    . Nose surgery      polyp removal from nasal cavity   . Tonsillectomy        Medication List       This list is accurate as of: 10/12/13 10:20 AM.  Always use your most recent med list.               albuterol 108 (90 BASE) MCG/ACT inhaler  Commonly known as:  PROVENTIL HFA;VENTOLIN HFA  Inhale 2 puffs into the lungs every 6 (six) hours as needed for wheezing or shortness of breath.     albuterol (5 MG/ML) 0.5% nebulizer solution  Commonly known as:  PROVENTIL  Take 0.5 mLs (2.5 mg total) by nebulization every 6 (six) hours as needed for wheezing.     Fluticasone-Salmeterol 250-50 MCG/DOSE Aepb  Commonly known as:  ADVAIR  Inhale 1 puff into the lungs 2 (two) times daily.     hydrOXYzine 25 MG tablet  Commonly known as:  ATARAX/VISTARIL  Take 1 tablet (25 mg total) by mouth at bedtime as needed (insomnia).     mometasone-formoterol 100-5 MCG/ACT Aero  Commonly known as:  DULERA  Inhale 2 puffs into the lungs 2 (two) times daily.     PARoxetine 40 MG tablet  Commonly  known as:  PAXIL  Take 1 tablet (40 mg total) by mouth every morning.        Meds ordered this encounter  Medications  . DISCONTD: Fluticasone-Salmeterol (ADVAIR) 250-50 MCG/DOSE AEPB    Sig: Inhale 1 puff into the lungs 2 (two) times daily.  . hydrOXYzine (ATARAX/VISTARIL) 25 MG tablet    Sig: Take 1 tablet (25 mg total) by mouth at bedtime as needed (insomnia).    Dispense:  30 tablet    Refill:  0  . albuterol (PROVENTIL HFA;VENTOLIN HFA) 108 (90 BASE) MCG/ACT inhaler    Sig: Inhale 2 puffs into the lungs every 6 (six) hours as needed for wheezing or shortness of breath.    Dispense:  1 Inhaler    Refill:  5  . albuterol (PROVENTIL) (5 MG/ML) 0.5% nebulizer solution    Sig: Take 0.5 mLs (2.5 mg total) by nebulization every 6 (six) hours as needed for wheezing.    Dispense:  20 mL    Refill:  12  . Fluticasone-Salmeterol (ADVAIR) 250-50 MCG/DOSE AEPB    Sig: Inhale 1 puff into the lungs 2 (two) times daily.    Dispense:  60 each    Refill:  3    Immunization History  Administered Date(s)  Administered  . Influenza Split 01/19/2013    History  Substance Use Topics  . Smoking status: Current Every Day Smoker -- 0.50 packs/day    Last Attempt to Quit: 01/18/2013  . Smokeless tobacco: Never Used  . Alcohol Use: No    Review of Systems  As noted in HPI  Filed Vitals:   10/12/13 0952  BP: 133/88  Pulse: 76  Temp: 98.2 F (36.8 C)  Resp: 16    Physical Exam  Physical Exam  Constitutional: No distress.  Eyes: EOM are normal. Pupils are equal, round, and reactive to light.  Cardiovascular: Normal rate and regular rhythm.   Pulmonary/Chest: Breath sounds normal. No respiratory distress. She has no wheezes. She has no rales.    CBC    Component Value Date/Time   WBC 5.3 05/01/2013 1230   RBC 4.00 05/01/2013 1230   HGB 12.5 05/01/2013 1230   HCT 37.1 05/01/2013 1230   PLT 227 05/01/2013 1230   MCV 92.8 05/01/2013 1230   LYMPHSABS 1.0 05/01/2013 1230   MONOABS  0.5 05/01/2013 1230   EOSABS 0.2 05/01/2013 1230   BASOSABS 0.0 05/01/2013 1230    CMP     Component Value Date/Time   NA 138 05/01/2013 1230   K 4.6 05/01/2013 1230   CL 103 05/01/2013 1230   CO2 26 05/01/2013 1230   GLUCOSE 84 05/01/2013 1230   BUN 12 05/01/2013 1230   CREATININE 0.65 05/01/2013 1230   CALCIUM 9.1 05/01/2013 1230   PROT 6.5 02/01/2013 1730   ALBUMIN 3.5 02/01/2013 1730   AST 25 02/01/2013 1730   ALT 41* 02/01/2013 1730   ALKPHOS 85 02/01/2013 1730   BILITOT 0.3 02/01/2013 1730   GFRNONAA >90 05/01/2013 1230   GFRAA >90 05/01/2013 1230    No results found for this basename: chol, tri, ldl    No components found with this basename: hga1c    Lab Results  Component Value Date/Time   AST 25 02/01/2013  5:30 PM    Assessment and Plan  COPD (chronic obstructive pulmonary disease) - Plan: Medication refill albuterol (PROVENTIL HFA;VENTOLIN HFA) 108 (90 BASE) MCG/ACT inhaler, albuterol (PROVENTIL) (5 MG/ML) 0.5% nebulizer solution, Fluticasone-Salmeterol (ADVAIR) 250-50 MCG/DOSE AEPB  Insomnia - Plan: Advised for sleep hygiene, will try hydrOXYzine (ATARAX/VISTARIL) 25 MG tablet each bedtime when necessary  Smoking.. continue Chantix.  Special screening for malignant neoplasms, colon - Plan: Ambulatory referral to Gastroenterology   Health Maintenance -Colonoscopy: Referral done -Up-to-date with her mammogram  Return in about 3 months (around 01/10/2014).  Doris Cheadle, MD

## 2013-10-12 NOTE — Progress Notes (Signed)
Patient here for medication refills Needs referral to GI for colonoscopy Needs something to help her sleep Taking chantix and it keeps her awake at night

## 2013-11-05 ENCOUNTER — Emergency Department (HOSPITAL_COMMUNITY)
Admission: EM | Admit: 2013-11-05 | Discharge: 2013-11-05 | Disposition: A | Discharge status: Home / Self Care | Payer: BC Managed Care – PPO | Attending: Emergency Medicine | Admitting: Emergency Medicine

## 2013-11-05 ENCOUNTER — Emergency Department (HOSPITAL_COMMUNITY): Payer: BC Managed Care – PPO

## 2013-11-05 ENCOUNTER — Encounter (HOSPITAL_COMMUNITY): Payer: Self-pay | Admitting: Emergency Medicine

## 2013-11-05 DIAGNOSIS — J441 Chronic obstructive pulmonary disease with (acute) exacerbation: Secondary | ICD-10-CM | POA: Insufficient documentation

## 2013-11-05 DIAGNOSIS — R509 Fever, unspecified: Secondary | ICD-10-CM | POA: Insufficient documentation

## 2013-11-05 DIAGNOSIS — R0602 Shortness of breath: Secondary | ICD-10-CM

## 2013-11-05 DIAGNOSIS — J3489 Other specified disorders of nose and nasal sinuses: Secondary | ICD-10-CM | POA: Insufficient documentation

## 2013-11-05 DIAGNOSIS — IMO0001 Reserved for inherently not codable concepts without codable children: Secondary | ICD-10-CM | POA: Insufficient documentation

## 2013-11-05 DIAGNOSIS — F329 Major depressive disorder, single episode, unspecified: Secondary | ICD-10-CM | POA: Insufficient documentation

## 2013-11-05 DIAGNOSIS — Z859 Personal history of malignant neoplasm, unspecified: Secondary | ICD-10-CM | POA: Insufficient documentation

## 2013-11-05 DIAGNOSIS — F17200 Nicotine dependence, unspecified, uncomplicated: Secondary | ICD-10-CM | POA: Insufficient documentation

## 2013-11-05 DIAGNOSIS — R011 Cardiac murmur, unspecified: Secondary | ICD-10-CM | POA: Insufficient documentation

## 2013-11-05 DIAGNOSIS — R52 Pain, unspecified: Secondary | ICD-10-CM | POA: Insufficient documentation

## 2013-11-05 DIAGNOSIS — F3289 Other specified depressive episodes: Secondary | ICD-10-CM | POA: Insufficient documentation

## 2013-11-05 DIAGNOSIS — Z79899 Other long term (current) drug therapy: Secondary | ICD-10-CM | POA: Insufficient documentation

## 2013-11-05 MED ORDER — IPRATROPIUM BROMIDE 0.02 % IN SOLN
0.5000 mg | Freq: Once | RESPIRATORY_TRACT | Status: AC
  Administered 2013-11-05: 0.5 mg via RESPIRATORY_TRACT
  Filled 2013-11-05: qty 2.5

## 2013-11-05 MED ORDER — ALBUTEROL SULFATE HFA 108 (90 BASE) MCG/ACT IN AERS: 2.0000 | INHALATION_SPRAY | Freq: Four times a day (QID) | RESPIRATORY_TRACT | Status: DC | PRN

## 2013-11-05 MED ORDER — PREDNISONE 20 MG PO TABS
60.0000 mg | ORAL_TABLET | Freq: Once | ORAL | Status: AC
  Administered 2013-11-05: 60 mg via ORAL
  Filled 2013-11-05: qty 3

## 2013-11-05 MED ORDER — ALBUTEROL SULFATE (5 MG/ML) 0.5% IN NEBU
5.0000 mg | INHALATION_SOLUTION | Freq: Once | RESPIRATORY_TRACT | Status: AC
  Administered 2013-11-05: 5 mg via RESPIRATORY_TRACT
  Filled 2013-11-05: qty 1

## 2013-11-05 MED ORDER — PREDNISONE 20 MG PO TABS: 40.0000 mg | ORAL_TABLET | Freq: Every day | ORAL | Status: DC

## 2013-11-05 NOTE — ED Notes (Signed)
93 while ambulating  

## 2013-11-05 NOTE — ED Notes (Signed)
Pt c/o flu like s/s x4days, hx of COPD, coughing til can breath, fever and chills

## 2013-11-05 NOTE — ED Provider Notes (Signed)
Medical screening examination/treatment/procedure(s) were performed by non-physician practitioner and as supervising physician I was immediately available for consultation/collaboration.  EKG Interpretation   None         Khayden Herzberg T Nayleen Janosik, MD 11/05/13 1920 

## 2013-11-05 NOTE — ED Provider Notes (Signed)
CSN: [id number]     Arrival date & time 11/05/13  0535 History   None    Chief Complaint  Patient presents with  . Shortness of Breath    flu like s/s  . Nasal Congestion  . Fever   (Consider location/radiation/quality/duration/timing/severity/associated sxs/prior Treatment) The history is provided by the patient and medical records.   This is a 52 year old female with past medical history significant for COPD, murmur, depression, headache, presenting to the ED for flulike symptoms, specifically subjective fever, chills, body aches, non-productive cough, and SOB.  Pt states she feels "rattling" in her chest from the congestion, but denies chest pain or palpitations.  Patient has used multiple albuterol nebulizer treatments at home without improvement, last tx around 200 last night. She denies any recent sick contacts with similar symptoms.  O2 sats on arrival 94%-- pt notes she usually runs arouns 97%.  Past Medical History  Diagnosis Date  . Depression 01/18/2013  . Tobacco abuse 01/18/2013  . Headache(784.0)   . COPD (chronic obstructive pulmonary disease)   . Heart murmur   . Cancer    Past Surgical History  Procedure Laterality Date  . Throat surgery    . Abdominal hysterectomy    . Nose surgery      polyp removal from nasal cavity   . Tonsillectomy     Family History  Problem Relation Age of Onset  . Diabetes Maternal Grandfather   . Heart disease Maternal Grandfather   . Cancer Maternal Grandfather   . Hypertension Maternal Grandfather    History  Substance Use Topics  . Smoking status: Current Every Day Smoker -- 0.50 packs/day    Last Attempt to Quit: 01/18/2013  . Smokeless tobacco: Never Used  . Alcohol Use: No   OB History   Grav Para Term Preterm Abortions TAB SAB Ect Mult Living                 Review of Systems  Constitutional: Positive for fever and chills.  Respiratory: Positive for cough, shortness of breath and wheezing.   Musculoskeletal:  Positive for myalgias.  All other systems reviewed and are negative.    Allergies  Review of patient's allergies indicates no known allergies.  Home Medications   Current Outpatient Rx  Name  Route  Sig  Dispense  Refill  . albuterol (PROVENTIL HFA;VENTOLIN HFA) 108 (90 BASE) MCG/ACT inhaler   Inhalation   Inhale 2 puffs into the lungs every 6 (six) hours as needed for wheezing or shortness of breath.   1 Inhaler   5   . albuterol (PROVENTIL) (5 MG/ML) 0.5% nebulizer solution   Nebulization   Take 0.5 mLs (2.5 mg total) by nebulization every 6 (six) hours as needed for wheezing.   20 mL   12   . Fluticasone-Salmeterol (ADVAIR) 250-50 MCG/DOSE AEPB   Inhalation   Inhale 1 puff into the lungs 2 (two) times daily.   60 each   3   . hydrOXYzine (ATARAX/VISTARIL) 25 MG tablet   Oral   Take 1 tablet (25 mg total) by mouth at bedtime as needed (insomnia).   30 tablet   0   . mometasone-formoterol (DULERA) 100-5 MCG/ACT AERO   Inhalation   Inhale 2 puffs into the lungs 2 (two) times daily.   1 Inhaler   0   . PARoxetine (PAXIL) 40 MG tablet   Oral   Take 1 tablet (40 mg total) by mouth every morning.   30 tablet  5    BP 129/95  Pulse 104  Temp(Src) 99.3 F (37.4 C) (Oral)  Resp 21  Ht 5\' 6"  (1.676 m)  Wt 209 lb (94.802 kg)  BMI 33.75 kg/m2  SpO2 94%  Physical Exam  Nursing note and vitals reviewed. Constitutional: She is oriented to person, place, and time. She appears well-developed and well-nourished. No distress.  HENT:  Head: Normocephalic and atraumatic.  Right Ear: Tympanic membrane and ear canal normal.  Left Ear: Tympanic membrane and ear canal normal.  Nose: Nose normal.  Mouth/Throat: Uvula is midline and oropharynx is clear and moist. No oropharyngeal exudate, posterior oropharyngeal edema, posterior oropharyngeal erythema or tonsillar abscesses.  Eyes: Conjunctivae and EOM are normal. Pupils are equal, round, and reactive to light.  Neck:  Normal range of motion. Neck supple.  Cardiovascular: Normal rate, regular rhythm and normal heart sounds.   Pulmonary/Chest: Effort normal. No accessory muscle usage. Not tachypneic. No respiratory distress. She has wheezes. She has rhonchi.  Normal work of breathing without accessory muscle use or retractions; diffuse wheezes and rhonchi throughout all lung fields; speaking in full complete sentences without difficulty  Musculoskeletal: Normal range of motion.  Neurological: She is alert and oriented to person, place, and time.  Skin: Skin is warm and dry. She is not diaphoretic.  Psychiatric: She has a normal mood and affect.    ED Course  Procedures (including critical care time) Labs Review Labs Reviewed - No data to display Imaging Review Dg Chest 2 View  11/05/2013   CLINICAL DATA:  Shortness of breath, congestion, fever.  EXAM: CHEST  2 VIEW  COMPARISON:  05/01/2013  FINDINGS: The heart size and mediastinal contours are within normal limits. Both lungs are clear. The visualized skeletal structures are unremarkable.  IMPRESSION: No active cardiopulmonary disease.   Electronically Signed   By: Burman Nieves M.D.   On: 11/05/2013 06:37    EKG Interpretation   None       MDM   1. Shortness of breath    Upon entering pts room she is sleeping comfortably without signs of respiratory distress and is able to answer questions in full, complete sentences without difficulty.  Will give additional nebulizer treatment, prednisone, and obtain screening CXR.  CXR negative for acute findings.  Pts lung sounds improved after neb tx.  Pt ambulated, maintained O2 sats of 93%-- this is somewhat lower than her baseline but pt remains without signs of respiratory distress and i feel this is acceptable.  Pt remains afebrile with mild tachycardia, likely from repeated use of albuterol, other VS stable-- pt is appropriate for discharge. Will start on prednisone taper.  Instructed to use albuterol  neb Q4H and inhaler for breakthrough.  FU with PCP.  Strict return precautions advised should sx change or worsen.  Garlon Hatchet, PA-C 11/05/13 [phone]

## 2013-11-14 ENCOUNTER — Ambulatory Visit: Payer: BC Managed Care – PPO | Admitting: Hematology & Oncology

## 2013-11-14 ENCOUNTER — Other Ambulatory Visit: Payer: BC Managed Care – PPO | Admitting: Lab

## 2014-01-10 ENCOUNTER — Ambulatory Visit: Payer: BC Managed Care – PPO | Admitting: Internal Medicine

## 2014-04-16 ENCOUNTER — Other Ambulatory Visit: Payer: Self-pay | Admitting: Family Medicine

## 2014-06-06 ENCOUNTER — Ambulatory Visit: Payer: BC Managed Care – PPO | Attending: Internal Medicine | Admitting: Internal Medicine

## 2014-06-06 ENCOUNTER — Encounter: Payer: Self-pay | Admitting: Internal Medicine

## 2014-06-06 VITALS — BP 125/81 | HR 99 | Temp 97.9°F | Resp 16 | Ht 66.0 in | Wt 210.0 lb

## 2014-06-06 DIAGNOSIS — F3289 Other specified depressive episodes: Secondary | ICD-10-CM | POA: Insufficient documentation

## 2014-06-06 DIAGNOSIS — Z8249 Family history of ischemic heart disease and other diseases of the circulatory system: Secondary | ICD-10-CM | POA: Insufficient documentation

## 2014-06-06 DIAGNOSIS — Z859 Personal history of malignant neoplasm, unspecified: Secondary | ICD-10-CM | POA: Insufficient documentation

## 2014-06-06 DIAGNOSIS — J4489 Other specified chronic obstructive pulmonary disease: Secondary | ICD-10-CM | POA: Insufficient documentation

## 2014-06-06 DIAGNOSIS — Z809 Family history of malignant neoplasm, unspecified: Secondary | ICD-10-CM | POA: Insufficient documentation

## 2014-06-06 DIAGNOSIS — R5381 Other malaise: Secondary | ICD-10-CM | POA: Insufficient documentation

## 2014-06-06 DIAGNOSIS — F17200 Nicotine dependence, unspecified, uncomplicated: Secondary | ICD-10-CM | POA: Insufficient documentation

## 2014-06-06 DIAGNOSIS — R011 Cardiac murmur, unspecified: Secondary | ICD-10-CM | POA: Insufficient documentation

## 2014-06-06 DIAGNOSIS — J449 Chronic obstructive pulmonary disease, unspecified: Secondary | ICD-10-CM | POA: Insufficient documentation

## 2014-06-06 DIAGNOSIS — R5383 Other fatigue: Principal | ICD-10-CM

## 2014-06-06 DIAGNOSIS — F329 Major depressive disorder, single episode, unspecified: Secondary | ICD-10-CM | POA: Insufficient documentation

## 2014-06-06 LAB — HEMOGLOBIN A1C
Hgb A1c MFr Bld: 5.6 % (ref <5.7)
Mean Plasma Glucose: 114 mg/dL (ref <117)

## 2014-06-06 LAB — IRON AND TIBC
%SAT: 9 % — ABNORMAL LOW (ref 20–55)
IRON: 37 ug/dL — AB (ref 42–145)
TIBC: 422 ug/dL (ref 250–470)
UIBC: 385 ug/dL (ref 125–400)

## 2014-06-06 LAB — CBC
HCT: 38.4 % (ref 36.0–46.0)
HEMOGLOBIN: 13.7 g/dL (ref 12.0–15.0)
MCH: 31.1 pg (ref 26.0–34.0)
MCHC: 35.7 g/dL (ref 30.0–36.0)
MCV: 87.1 fL (ref 78.0–100.0)
Platelets: 324 10*3/uL (ref 150–400)
RBC: 4.41 MIL/uL (ref 3.87–5.11)
RDW: 13.1 % (ref 11.5–15.5)
WBC: 8.1 10*3/uL (ref 4.0–10.5)

## 2014-06-06 NOTE — Progress Notes (Signed)
Patient ID: Lori Owens, female   DOB: 1961-08-10, 53 y.o.   MRN: [id number]  CC: fatigue  HPI:  Patient reports that she has been feeling extremely weak and fatigued over the past 4 weeks.  She states that she usually has plenty of energy but recently has ben struggling.  She has been trying B complex vitamins, gingko, vitamin d, and biotin.  She denies any recent illness.  She has been eating healthy to see if that will help her.  Denies weight changes, fever, chills, frequent thirst, or urination.  Patient reports that she gets 8 hours of sleep per night plus daytime naps.    No Known Allergies Past Medical History  Diagnosis Date  . Depression 01/18/2013  . Tobacco abuse 01/18/2013  . Headache(784.0)   . COPD (chronic obstructive pulmonary disease)   . Heart murmur   . Cancer    Current Outpatient Prescriptions on File Prior to Visit  Medication Sig Dispense Refill  . albuterol (PROVENTIL HFA;VENTOLIN HFA) 108 (90 BASE) MCG/ACT inhaler Inhale 2 puffs into the lungs every 6 (six) hours as needed for wheezing or shortness of breath.  1 Inhaler  5  . albuterol (PROVENTIL) (5 MG/ML) 0.5% nebulizer solution Take 0.5 mLs (2.5 mg total) by nebulization every 6 (six) hours as needed for wheezing.  20 mL  12  . Fluticasone-Salmeterol (ADVAIR) 250-50 MCG/DOSE AEPB Inhale 1 puff into the lungs 2 (two) times daily.  60 each  3  . PARoxetine (PAXIL) 40 MG tablet TAKE ONE TABLET BY MOUTH DAILY IN THE MORNING  30 tablet  0  . albuterol (PROVENTIL HFA;VENTOLIN HFA) 108 (90 BASE) MCG/ACT inhaler Inhale 2 puffs into the lungs every 6 (six) hours as needed for wheezing or shortness of breath.  1 Inhaler  0  . hydrOXYzine (ATARAX/VISTARIL) 25 MG tablet Take 1 tablet (25 mg total) by mouth at bedtime as needed (insomnia).  30 tablet  0  . mometasone-formoterol (DULERA) 100-5 MCG/ACT AERO Inhale 2 puffs into the lungs 2 (two) times daily.  1 Inhaler  0  . predniSONE (DELTASONE) 20 MG tablet Take 2 tablets (40  mg total) by mouth daily. Take 40 mg by mouth daily for 3 days, then 20mg  by mouth daily for 3 days, then 10mg  daily for 3 days  12 tablet  0   No current facility-administered medications on file prior to visit.   Family History  Problem Relation Age of Onset  . Diabetes Maternal Grandfather   . Heart disease Maternal Grandfather   . Cancer Maternal Grandfather   . Hypertension Maternal Grandfather    History   Social History  . Marital Status: Single    Spouse Name: N/A    Number of Children: N/A  . Years of Education: N/A   Occupational History  . Not on file.   Social History Main Topics  . Smoking status: Current Every Day Smoker -- 0.50 packs/day    Last Attempt to Quit: 01/18/2013  . Smokeless tobacco: Never Used  . Alcohol Use: No  . Drug Use: No  . Sexual Activity: No   Other Topics Concern  . Not on file   Social History Narrative   ** Merged History Encounter **        Review of Systems: Constitutional: Negative for fever, chills, diaphoresis, activity change, appetite change HENT: Negative for ear pain, nosebleeds, congestion, facial swelling, rhinorrhea, neck pain, neck stiffness and ear discharge.  Eyes: Negative for pain, discharge, redness, itching and visual  disturbance. Respiratory: Negative for cough, choking, chest tightness, shortness of breath, wheezing and stridor.  Cardiovascular: Negative for chest pain, palpitations and leg swelling. Gastrointestinal: Negative for abdominal distention. Genitourinary: Negative for dysuria, urgency, frequency, hematuria, flank pain, decreased urine volume, difficulty urinating and dyspareunia.  Musculoskeletal: Negative for back pain, joint swelling, arthralgias and gait problem. Neurological: Negative for dizziness, tremors, seizures, syncope, facial asymmetry, speech difficulty, weakness, light-headedness, numbness and headaches.  Hematological: Negative for adenopathy. Does not bruise/bleed  easily. Psychiatric/Behavioral: Negative for hallucinations, behavioral problems, confusion, dysphoric mood, decreased concentration and agitation.    Objective:   Filed Vitals:   06/06/14 1658  BP: 125/81  Pulse: 99  Temp: 97.9 F (36.6 C)  Resp: 16    Physical Exam: Constitutional: Patient appears well-developed and well-nourished. No distress. HENT: Normocephalic, atraumatic, External right and left ear normal. Oropharynx is clear and moist.  Eyes: Conjunctivae and EOM are normal. PERRLA, no scleral icterus. Neck: Normal ROM. Neck supple. No JVD. No tracheal deviation. No thyromegaly. CVS: RRR, S1/S2 +, no murmurs, no gallops, no carotid bruit.  Pulmonary: Effort and breath sounds normal, no stridor, rhonchi, wheezes, rales.  Abdominal: Soft. BS +,  no distension, tenderness, rebound or guarding.  Musculoskeletal: Normal range of motion. No edema and no tenderness.  Lymphadenopathy: No lymphadenopathy noted, cervical Neuro: Alert. Normal reflexes, muscle tone coordination. No cranial nerve deficit. Skin: Skin is warm and dry. No rash noted. Not diaphoretic. No erythema. No pallor. Psychiatric: Normal mood and affect. Behavior, judgment, thought content normal.  Lab Results  Component Value Date   WBC 5.3 05/01/2013   HGB 12.5 05/01/2013   HCT 37.1 05/01/2013   MCV 92.8 05/01/2013   PLT 227 05/01/2013   Lab Results  Component Value Date   CREATININE 0.65 05/01/2013   BUN 12 05/01/2013   NA 138 05/01/2013   K 4.6 05/01/2013   CL 103 05/01/2013   CO2 26 05/01/2013    Lab Results  Component Value Date   HGBA1C  Value: 5.8 (NOTE)                                                                       According to the ADA Clinical Practice Recommendations for 2011, when HbA1c is used as a screening test:   >=6.5%   Diagnostic of Diabetes Mellitus           (if abnormal result  is confirmed)  5.7-6.4%   Increased risk of developing Diabetes Mellitus  References:Diagnosis and  Classification of Diabetes Mellitus,Diabetes CZYS,0630,16(WFUXN 1):S62-S69 and Standards of Medical Care in         Diabetes - 2011,Diabetes Care,2011,34  (Suppl 1):S11-S61.* 02/19/2011   Lipid Panel  No results found for this basename: chol, trig, hdl, cholhdl, vldl, ldlcalc       Assessment and plan:   Zhuri was seen today for fatigue.  Diagnoses and associated orders for this visit:  Other malaise and fatigue - TSH - CBC - Iron and TIBC - Hemoglobin A1c  Tobacco use disorder Smoking cessation discussed in detail  Will call patient with results of lab, may RTC if problem worsens      Chari Manning, [place] 06/12/2014, 7:30 PM

## 2014-06-06 NOTE — Progress Notes (Signed)
Patient presents for fatigue over last 4 weeks Wants to quit smoking

## 2014-06-06 NOTE — Patient Instructions (Signed)

## 2014-06-07 LAB — TSH: TSH: 2.62 u[IU]/mL (ref 0.350–4.500)

## 2014-06-11 ENCOUNTER — Telehealth: Payer: Self-pay | Admitting: Internal Medicine

## 2014-06-11 NOTE — Telephone Encounter (Signed)
Pt calling for results for labs taken on 06/06/14. Please f/u with pt.

## 2014-06-12 ENCOUNTER — Telehealth: Payer: Self-pay | Admitting: Emergency Medicine

## 2014-06-12 NOTE — Telephone Encounter (Signed)
Lab results given today. Pt verbalized understanding

## 2014-06-12 NOTE — Telephone Encounter (Signed)
Pt called in for lab results. Results given with education on eating food rich in iron

## 2014-06-12 NOTE — Telephone Encounter (Signed)
Pt calling because she has yet to receive results for labs. Please f/u with pt.

## 2014-08-08 ENCOUNTER — Encounter (HOSPITAL_COMMUNITY): Payer: Self-pay | Admitting: Emergency Medicine

## 2014-08-08 ENCOUNTER — Emergency Department (HOSPITAL_COMMUNITY)
Admission: EM | Admit: 2014-08-08 | Discharge: 2014-08-08 | Disposition: A | Discharge status: Home / Self Care | Payer: BC Managed Care – PPO | Attending: Emergency Medicine | Admitting: Emergency Medicine

## 2014-08-08 DIAGNOSIS — M25561 Pain in right knee: Secondary | ICD-10-CM

## 2014-08-08 DIAGNOSIS — Z7951 Long term (current) use of inhaled steroids: Secondary | ICD-10-CM | POA: Insufficient documentation

## 2014-08-08 DIAGNOSIS — Z7952 Long term (current) use of systemic steroids: Secondary | ICD-10-CM | POA: Insufficient documentation

## 2014-08-08 DIAGNOSIS — Z859 Personal history of malignant neoplasm, unspecified: Secondary | ICD-10-CM | POA: Insufficient documentation

## 2014-08-08 DIAGNOSIS — Z79899 Other long term (current) drug therapy: Secondary | ICD-10-CM | POA: Insufficient documentation

## 2014-08-08 DIAGNOSIS — F329 Major depressive disorder, single episode, unspecified: Secondary | ICD-10-CM | POA: Insufficient documentation

## 2014-08-08 DIAGNOSIS — J449 Chronic obstructive pulmonary disease, unspecified: Secondary | ICD-10-CM | POA: Insufficient documentation

## 2014-08-08 DIAGNOSIS — Z720 Tobacco use: Secondary | ICD-10-CM | POA: Insufficient documentation

## 2014-08-08 DIAGNOSIS — R011 Cardiac murmur, unspecified: Secondary | ICD-10-CM | POA: Insufficient documentation

## 2014-08-08 DIAGNOSIS — M79609 Pain in unspecified limb: Secondary | ICD-10-CM

## 2014-08-08 MED ORDER — MELOXICAM 7.5 MG PO TABS: 7.5000 mg | ORAL_TABLET | Freq: Two times a day (BID) | ORAL | Status: DC

## 2014-08-08 NOTE — ED Provider Notes (Signed)
CSN: [id number]     Arrival date & time 08/08/14  1343 History  This chart was scribed for non-physician practitioner, Michele Mcalpine, PA-C, working with Carmin Muskrat, MD by Ladene Artist, ED Scribe. This patient was seen in room TR05C/TR05C and the patient's care was started at 1:56 PM.   Chief Complaint  Patient presents with  . Knee Pain   The history is provided by the patient. No language interpreter was used.   HPI Comments: Lori Owens is a 53 y.o. female who presents to the Emergency Department complaining of gradually worsening R knee pain over the past 3 weeks. 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 Pt states that she is not able to stand up after cleaning. She denies pain with sitting but reports pain with bending. Pt reports a "clicking" sensation in R knee first noted last night. She also reports "tightness" behind her knee; similar to a cramp. Pt has tried ibuprofen and arthritis medication without relief. Pt has not followed up with PCP; she is seen at [facility] but was unable to get an appointment.   Past Medical History  Diagnosis Date  . Depression 01/18/2013  . Tobacco abuse 01/18/2013  . Headache(784.0)   . COPD (chronic obstructive pulmonary disease)   . Heart murmur   . Cancer    Past Surgical History  Procedure Laterality Date  . Throat surgery    . Abdominal hysterectomy    . Nose surgery      polyp removal from nasal cavity   . Tonsillectomy     Family History  Problem Relation Age of Onset  . Diabetes Maternal Grandfather   . Heart disease Maternal Grandfather   . Cancer Maternal Grandfather   . Hypertension Maternal Grandfather    History  Substance Use Topics  . Smoking status: Current Every Day Smoker -- 0.50 packs/day    Last Attempt to Quit: 01/18/2013  . Smokeless tobacco: Never Used  . Alcohol Use: No   OB History   Grav Para Term Preterm Abortions TAB SAB Ect Mult Living                  Review of Systems  Musculoskeletal: Positive for arthralgias.  All other systems reviewed and are negative.  Allergies  Review of patient's allergies indicates no known allergies.  Home Medications   Prior to Admission medications   Medication Sig Start Date End Date Taking? Authorizing Provider  albuterol (PROVENTIL HFA;VENTOLIN HFA) 108 (90 BASE) MCG/ACT inhaler Inhale 2 puffs into the lungs every 6 (six) hours as needed for wheezing or shortness of breath. 10/12/13   Lorayne Marek, MD  albuterol (PROVENTIL HFA;VENTOLIN HFA) 108 (90 BASE) MCG/ACT inhaler Inhale 2 puffs into the lungs every 6 (six) hours as needed for wheezing or shortness of breath. 11/05/13   Larene Pickett, PA-C  albuterol (PROVENTIL) (5 MG/ML) 0.5% nebulizer solution Take 0.5 mLs (2.5 mg total) by nebulization every 6 (six) hours as needed for wheezing. 10/12/13   Lorayne Marek, MD  Fluticasone-Salmeterol (ADVAIR) 250-50 MCG/DOSE AEPB Inhale 1 puff into the lungs 2 (two) times daily. 10/12/13   Lorayne Marek, MD  hydrOXYzine (ATARAX/VISTARIL) 25 MG tablet Take 1 tablet (25 mg total) by mouth at bedtime as needed (insomnia). 10/12/13   Lorayne Marek, MD  meloxicam (MOBIC) 7.5 MG tablet Take 1 tablet (7.5 mg total) by mouth 2 (two) times daily. 08/08/14   Illene Labrador, PA-C  mometasone-formoterol [facility])  100-5 MCG/ACT AERO Inhale 2 puffs into the lungs 2 (two) times daily. 04/10/13   Geradine Girt, DO  PARoxetine (PAXIL) 40 MG tablet TAKE ONE TABLET BY MOUTH DAILY IN THE MORNING    Olugbemiga E Jegede, MD  predniSONE (DELTASONE) 20 MG tablet Take 2 tablets (40 mg total) by mouth daily. Take 40 mg by mouth daily for 3 days, then 20mg  by mouth daily for 3 days, then 10mg  daily for 3 days 11/05/13   Larene Pickett, PA-C   Triage Vitals: BP 131/100  Pulse 108  Temp(Src) 97.6 F (36.4 C) (Oral)  Resp 18  SpO2 99% Physical Exam  Nursing note and vitals reviewed. Constitutional: She is oriented to person, place, and time.  She appears well-developed and well-nourished. No distress.  HENT:  Head: Normocephalic and atraumatic.  Mouth/Throat: Oropharynx is clear and moist.  Eyes: Conjunctivae and EOM are normal.  Neck: Normal range of motion. Neck supple.  Cardiovascular: Normal rate, regular rhythm and normal heart sounds.   Pulmonary/Chest: Effort normal and breath sounds normal. No respiratory distress.  Musculoskeletal: Normal range of motion. She exhibits no edema.  R knee: Multiple varicosities Mild tenderness to palpation over patellar Tender to palpation in popliteal space with no masses. Mild tenderness to palpation midway down calf No swelling or color change  Neurological: She is alert and oriented to person, place, and time. No sensory deficit.  Skin: Skin is warm and dry.  Psychiatric: She has a normal mood and affect. Her behavior is normal.   ED Course  Procedures (including critical care time) DIAGNOSTIC STUDIES: Oxygen Saturation is 99% on RA, normal by my interpretation.    COORDINATION OF CARE: 2:04 PM-Discussed treatment plan which includes [place] with pt at bedside and pt agreed to plan.   Labs Review Labs Reviewed - No data to display  Imaging Review No results found.   EKG Interpretation None      MDM   Final diagnoses:  Right knee pain   Patient with me and leg pain. She is nontoxic appearing and in no apparent distress. Afebrile, hypertensive, vitals otherwise stable. No tachycardia on my exam. No chest pain or shortness of breath. Lower extremity venous duplex negative for DVT. She is constantly working on her knees, most likely osteoarthritis. Will treat with meloxicam, advised ice and elevation. Followup with PCP. Stable for discharge. Return precautions given. Patient states understanding of treatment care plan and is agreeable.  I personally performed the services described in this documentation, which was scribed in my presence. The recorded information has been  reviewed and is accurate.    Illene Labrador, PA-C 08/08/14 1601

## 2014-08-08 NOTE — Progress Notes (Signed)
VASCULAR LAB PRELIMINARY  PRELIMINARY  PRELIMINARY  PRELIMINARY  Right lower extremity venous duplex completed.    Preliminary report : Right:  No evidence of DVT, superficial thrombosis, or Baker's cyst.  Zyann Mabry, RVS 08/08/2014, 4:02 PM

## 2014-08-08 NOTE — ED Provider Notes (Signed)
Medical screening examination/treatment/procedure(s) were performed by non-physician practitioner and as supervising physician I was immediately available for consultation/collaboration.  Carmin Muskrat, MD 08/08/14 [phone]

## 2014-08-08 NOTE — Discharge Instructions (Signed)
Take meloxicam twice daily as directed.   Knee Pain The knee is the complex joint between your thigh and your lower leg. It is made up of bones, tendons, ligaments, and cartilage. The bones that make up the knee are:  The femur in the thigh.  The tibia and fibula in the lower leg.  The patella or kneecap riding in the groove on the lower femur. CAUSES  Knee pain is a common complaint with many causes. A few of these causes are:  Injury, such as:  A ruptured ligament or tendon injury.  Torn cartilage.  Medical conditions, such as:  Gout  Arthritis  Infections  Overuse, over training, or overdoing a physical activity. Knee pain can be minor or severe. Knee pain can accompany debilitating injury. Minor knee problems often respond well to self-care measures or get well on their own. More serious injuries may need medical intervention or even surgery. SYMPTOMS The knee is complex. Symptoms of knee problems can vary widely. Some of the problems are:  Pain with movement and weight bearing.  Swelling and tenderness.  Buckling of the knee.  Inability to straighten or extend your knee.  Your knee locks and you cannot straighten it.  Warmth and redness with pain and fever.  Deformity or dislocation of the kneecap. DIAGNOSIS  Determining what is wrong may be very straight forward such as when there is an injury. It can also be challenging because of the complexity of the knee. Tests to make a diagnosis may include:  Your caregiver taking a history and doing a physical exam.  Routine X-rays can be used to rule out other problems. X-rays will not reveal a cartilage tear. Some injuries of the knee can be diagnosed by:  Arthroscopy a surgical technique by which a small video camera is inserted through tiny incisions on the sides of the knee. This procedure is used to examine and repair internal knee joint problems. Tiny instruments can be used during arthroscopy to repair the torn  knee cartilage (meniscus).  Arthrography is a radiology technique. A contrast liquid is directly injected into the knee joint. Internal structures of the knee joint then become visible on X-ray film.  An MRI scan is a non X-ray radiology procedure in which magnetic fields and a computer produce two- or three-dimensional images of the inside of the knee. Cartilage tears are often visible using an MRI scanner. MRI scans have largely replaced arthrography in diagnosing cartilage tears of the knee.  Blood work.  Examination of the fluid that helps to lubricate the knee joint (synovial fluid). This is done by taking a sample out using a needle and a syringe. TREATMENT The treatment of knee problems depends on the cause. Some of these treatments are:  Depending on the injury, proper casting, splinting, surgery, or physical therapy care will be needed.  Give yourself adequate recovery time. Do not overuse your joints. If you begin to get sore during workout routines, back off. Slow down or do fewer repetitions.  For repetitive activities such as cycling or running, maintain your strength and nutrition.  Alternate muscle groups. For example, if you are a weight lifter, work the upper body on one day and the lower body the next.  Either tight or weak muscles do not give the proper support for your knee. Tight or weak muscles do not absorb the stress placed on the knee joint. Keep the muscles surrounding the knee strong.  Take care of mechanical problems.  If you have  flat feet, orthotics or special shoes may help. See your caregiver if you need help.  Arch supports, sometimes with wedges on the inner or outer aspect of the heel, can help. These can shift pressure away from the side of the knee most bothered by osteoarthritis.  A brace called an "unloader" brace also may be used to help ease the pressure on the most arthritic side of the knee.  If your caregiver has prescribed crutches, braces,  wraps or ice, use as directed. The acronym for this is PRICE. This means protection, rest, ice, compression, and elevation.  Nonsteroidal anti-inflammatory drugs (NSAIDs), can help relieve pain. But if taken immediately after an injury, they may actually increase swelling. Take NSAIDs with food in your stomach. Stop them if you develop stomach problems. Do not take these if you have a history of ulcers, stomach pain, or bleeding from the bowel. Do not take without your caregiver's approval if you have problems with fluid retention, heart failure, or kidney problems.  For ongoing knee problems, physical therapy may be helpful.  Glucosamine and chondroitin are over-the-counter dietary supplements. Both may help relieve the pain of osteoarthritis in the knee. These medicines are different from the usual anti-inflammatory drugs. Glucosamine may decrease the rate of cartilage destruction.  Injections of a corticosteroid drug into your knee joint may help reduce the symptoms of an arthritis flare-up. They may provide pain relief that lasts a few months. You may have to wait a few months between injections. The injections do have a small increased risk of infection, water retention, and elevated blood sugar levels.  Hyaluronic acid injected into damaged joints may ease pain and provide lubrication. These injections may work by reducing inflammation. A series of shots may give relief for as long as 6 months.  Topical painkillers. Applying certain ointments to your skin may help relieve the pain and stiffness of osteoarthritis. Ask your pharmacist for suggestions. Many over the-counter products are approved for temporary relief of arthritis pain.  In some countries, doctors often prescribe topical NSAIDs for relief of chronic conditions such as arthritis and tendinitis. A review of treatment with NSAID creams found that they worked as well as oral medications but without the serious side  effects. PREVENTION  Maintain a healthy weight. Extra pounds put more strain on your joints.  Get strong, stay limber. Weak muscles are a common cause of knee injuries. Stretching is important. Include flexibility exercises in your workouts.  Be smart about exercise. If you have osteoarthritis, chronic knee pain or recurring injuries, you may need to change the way you exercise. This does not mean you have to stop being active. If your knees ache after jogging or playing basketball, consider switching to swimming, water aerobics, or other low-impact activities, at least for a few days a week. Sometimes limiting high-impact activities will provide relief.  Make sure your shoes fit well. Choose footwear that is right for your sport.  Protect your knees. Use the proper gear for knee-sensitive activities. Use kneepads when playing volleyball or laying carpet. Buckle your seat belt every time you drive. Most shattered kneecaps occur in car accidents.  Rest when you are tired. SEEK MEDICAL CARE IF:  You have knee pain that is continual and does not seem to be getting better.  SEEK IMMEDIATE MEDICAL CARE IF:  Your knee joint feels hot to the touch and you have a high fever. MAKE SURE YOU:   Understand these instructions.  Will watch your condition.  Will  get help right away if you are not doing well or get worse. Document Released: 08/22/2007 Document Revised: 01/17/2012 Document Reviewed: 08/22/2007 [facility] Patient Information 2015 [place], [place]. This information is not intended to replace advice given to you by your health care provider. Make sure you discuss any questions you have with your health care provider. Osteoarthritis Osteoarthritis is a disease that causes soreness and inflammation of a joint. It occurs when the cartilage at the affected joint wears down. Cartilage acts as a cushion, covering the ends of bones where they meet to form a joint. Osteoarthritis is the most common  form of arthritis. It often occurs in older people. The joints affected most often by this condition include those in the:  Ends of the fingers.  Thumbs.  Neck.  Lower back.  Knees.  Hips. CAUSES  Over time, the cartilage that covers the ends of bones begins to wear away. This causes bone to rub on bone, producing pain and stiffness in the affected joints.  RISK FACTORS Certain factors can increase your chances of having osteoarthritis, including:  Older age.  Excessive body weight.  Overuse of joints.  Previous joint injury. SIGNS AND SYMPTOMS   Pain, swelling, and stiffness in the joint.  Over time, the joint may lose its normal shape.  Small deposits of bone (osteophytes) may grow on the edges of the joint.  Bits of bone or cartilage can break off and float inside the joint space. This may cause more pain and damage. DIAGNOSIS  Your health care provider will do a physical exam and ask about your symptoms. Various tests may be ordered, such as:  X-rays of the affected joint.  An MRI scan.  Blood tests to rule out other types of arthritis.  Joint fluid tests. This involves using a needle to draw fluid from the joint and examining the fluid under a microscope. TREATMENT  Goals of treatment are to control pain and improve joint function. Treatment plans may include:  A prescribed exercise program that allows for rest and joint relief.  A weight control plan.  Pain relief techniques, such as:  Properly applied heat and cold.  Electric pulses delivered to nerve endings under the skin (transcutaneous electrical nerve stimulation [TENS]).  Massage.  Certain nutritional supplements.  Medicines to control pain, such as:  Acetaminophen.  Nonsteroidal anti-inflammatory drugs (NSAIDs), such as naproxen.  Narcotic or central-acting agents, such as tramadol.  Corticosteroids. These can be given orally or as an injection.  Surgery to reposition the bones and  relieve pain (osteotomy) or to remove loose pieces of bone and cartilage. Joint replacement may be needed in advanced states of osteoarthritis. HOME CARE INSTRUCTIONS   Take medicines only as directed by your health care provider.  Maintain a healthy weight. Follow your health care provider's instructions for weight control. This may include dietary instructions.  Exercise as directed. Your health care provider can recommend specific types of exercise. These may include:  Strengthening exercises. These are done to strengthen the muscles that support joints affected by arthritis. They can be performed with weights or with exercise bands to add resistance.  Aerobic activities. These are exercises, such as brisk walking or low-impact aerobics, that get your heart pumping.  Range-of-motion activities. These keep your joints limber.  Balance and agility exercises. These help you maintain daily living skills.  Rest your affected joints as directed by your health care provider.  Keep all follow-up visits as directed by your health care provider. [place]  CARE IF:   Your skin turns red.  You develop a rash in addition to your joint pain.  You have worsening joint pain.  You have a fever along with joint or muscle aches. SEEK IMMEDIATE MEDICAL CARE IF:  You have a significant loss of weight or appetite.  You have night sweats. [place] of Arthritis and Musculoskeletal and Skin Diseases: www.niams.SouthExposed.es  [employer] on Aging: http://kim-miller.com/  American College of Rheumatology: www.rheumatology.org Document Released: 10/25/2005 Document Revised: 03/11/2014 Document Reviewed: 07/02/2013 [facility] Patient Information 2015 [place], [place]. This information is not intended to replace advice given to you by your health care provider. Make sure you discuss any questions you have with your health care provider.

## 2014-08-08 NOTE — ED Notes (Signed)
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 Denies taking anything for pain. Ambulatory to triage.

## 2014-08-25 ENCOUNTER — Encounter (HOSPITAL_COMMUNITY): Payer: Self-pay | Admitting: Emergency Medicine

## 2014-08-25 ENCOUNTER — Emergency Department (HOSPITAL_COMMUNITY)
Admission: EM | Admit: 2014-08-25 | Discharge: 2014-08-25 | Disposition: A | Discharge status: Home / Self Care | Payer: Self-pay | Attending: Emergency Medicine | Admitting: Emergency Medicine

## 2014-08-25 ENCOUNTER — Emergency Department (HOSPITAL_COMMUNITY): Payer: Self-pay

## 2014-08-25 DIAGNOSIS — Z859 Personal history of malignant neoplasm, unspecified: Secondary | ICD-10-CM | POA: Insufficient documentation

## 2014-08-25 DIAGNOSIS — J441 Chronic obstructive pulmonary disease with (acute) exacerbation: Secondary | ICD-10-CM | POA: Insufficient documentation

## 2014-08-25 DIAGNOSIS — Z720 Tobacco use: Secondary | ICD-10-CM | POA: Insufficient documentation

## 2014-08-25 DIAGNOSIS — R011 Cardiac murmur, unspecified: Secondary | ICD-10-CM | POA: Insufficient documentation

## 2014-08-25 DIAGNOSIS — Z7952 Long term (current) use of systemic steroids: Secondary | ICD-10-CM | POA: Insufficient documentation

## 2014-08-25 DIAGNOSIS — Z79899 Other long term (current) drug therapy: Secondary | ICD-10-CM | POA: Insufficient documentation

## 2014-08-25 DIAGNOSIS — R0602 Shortness of breath: Secondary | ICD-10-CM

## 2014-08-25 DIAGNOSIS — F329 Major depressive disorder, single episode, unspecified: Secondary | ICD-10-CM | POA: Insufficient documentation

## 2014-08-25 MED ORDER — IPRATROPIUM BROMIDE 0.02 % IN SOLN
0.5000 mg | Freq: Once | RESPIRATORY_TRACT | Status: AC
  Administered 2014-08-25: 0.5 mg via RESPIRATORY_TRACT
  Filled 2014-08-25: qty 2.5

## 2014-08-25 MED ORDER — ALBUTEROL SULFATE (2.5 MG/3ML) 0.083% IN NEBU
5.0000 mg | INHALATION_SOLUTION | Freq: Once | RESPIRATORY_TRACT | Status: AC
  Administered 2014-08-25: 5 mg via RESPIRATORY_TRACT
  Filled 2014-08-25: qty 6

## 2014-08-25 MED ORDER — PREDNISONE 20 MG PO TABS: ORAL_TABLET | ORAL | Status: DC

## 2014-08-25 MED ORDER — ALBUTEROL SULFATE HFA 108 (90 BASE) MCG/ACT IN AERS
2.0000 | INHALATION_SPRAY | Freq: Once | RESPIRATORY_TRACT | Status: AC
  Administered 2014-08-25: 2 via RESPIRATORY_TRACT
  Filled 2014-08-25: qty 6.7

## 2014-08-25 NOTE — Discharge Instructions (Signed)

## 2014-08-25 NOTE — ED Notes (Signed)
Pt from home c/o shortness of breath x 4 days. Nonproductive cough. Using albuterol neb treatments without relief. She is out of her inhaler.

## 2014-08-25 NOTE — ED Provider Notes (Signed)
CSN: [id number]     Arrival date & time 08/25/14  1324 History   None    Chief Complaint  Patient presents with  . Shortness of Breath  . COPD   Patient is a 53 y.o. female presenting with shortness of breath. The history is provided by the patient. No language interpreter was used.  Shortness of Breath Associated symptoms: no cough and no fever    This chart was scribed for non-physician practitioner, Jaquita Folds PA-C working with No att. providers found, by Thea Alken, ED Scribe. This patient was seen in room WTR5/WTR5 and the patient's care was started at 11:15 AM.  Lori Owens is a 53 y.o. female with PMHx COPD who presents to the Emergency Department complaining of SOB for the past 4 days. Pt states she has been unable to get breathing under control. She usually use nebulizer breathing treatment at home, last treatment being this morning but reports treatment did not relieve SOB. Pt states she is currently out albuterol inhaler. 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 Pt states she has tried chantix twice to quit smoking. She reports Chantix worked the second time but had trouble sleeping with medication. Pt denies fever, chest pain, rhinorrhea, and productive cough. Pt refuse CXR.  Past Medical History  Diagnosis Date  . Depression 01/18/2013  . Tobacco abuse 01/18/2013  . Headache(784.0)   . COPD (chronic obstructive pulmonary disease)   . Heart murmur   . Cancer    Past Surgical History  Procedure Laterality Date  . Throat surgery    . Abdominal hysterectomy    . Nose surgery      polyp removal from nasal cavity   . Tonsillectomy     Family History  Problem Relation Age of Onset  . Diabetes Maternal Grandfather   . Heart disease Maternal Grandfather   . Cancer Maternal Grandfather   . Hypertension Maternal Grandfather    History  Substance Use Topics  . Smoking status: Current Every Day Smoker -- 0.50 packs/day    Last  Attempt to Quit: 01/18/2013  . Smokeless tobacco: Never Used  . Alcohol Use: No   OB History   Grav Para Term Preterm Abortions TAB SAB Ect Mult Living                 Review of Systems  Constitutional: Negative for fever and chills.  HENT: Negative for rhinorrhea.   Respiratory: Positive for shortness of breath. Negative for cough.   All other systems reviewed and are negative.   Allergies  Review of patient's allergies indicates no known allergies.  Home Medications   Prior to Admission medications   Medication Sig Start Date End Date Taking? Authorizing Provider  albuterol (PROVENTIL) (5 MG/ML) 0.5% nebulizer solution Take 2.5 mg by nebulization every 6 (six) hours as needed for wheezing or shortness of breath (COPD).   Yes Historical Provider, MD  BIOTIN PO Take 1,000 mg by mouth daily.   Yes Historical Provider, MD  IRON PO Take 1,000 mg by mouth daily.   Yes Historical Provider, MD  mometasone-formoterol (DULERA) 100-5 MCG/ACT AERO Inhale 2 puffs into the lungs 2 (two) times daily.   Yes Historical Provider, MD  PARoxetine (PAXIL) 40 MG tablet Take 40 mg by mouth every morning.   Yes Historical Provider, MD  Probiotic Product (PROBIOTIC PO) Take 50,000 Units by mouth daily.   Yes Historical Provider, MD  Vitamin D, Ergocalciferol, (DRISDOL)  50000 UNITS CAPS capsule Take 150,000 Units by mouth daily.   Yes Historical Provider, MD  predniSONE (DELTASONE) 20 MG tablet 3 tabs po day one, then 2 tabs daily x 4 days 08/25/14   Viona Gilmore Kacia Halley, PA-C   BP 136/93  Pulse 86  Temp(Src) 97.7 F (36.5 C) (Oral)  Resp 18  SpO2 94% Physical Exam  Nursing note and vitals reviewed. Constitutional: She is oriented to person, place, and time. She appears well-developed and well-nourished. No distress.  HENT:  Head: Normocephalic and atraumatic.  Mouth/Throat: Oropharynx is clear and moist.  Eyes: Conjunctivae and EOM are normal.  Neck: Neck supple. No JVD present.  Cardiovascular:  Normal rate.   Pulmonary/Chest: Effort normal. No respiratory distress. She has wheezes. She has no rales. She exhibits no tenderness.  Abdominal: Soft. She exhibits no distension. There is no tenderness.  Musculoskeletal: Normal range of motion.  Neurological: She is alert and oriented to person, place, and time.  Skin: Skin is warm and dry.  Psychiatric: She has a normal mood and affect. Her behavior is normal.    ED Course  Procedures (including critical care time) DIAGNOSTIC STUDIES: Oxygen Saturation is 96% on RA, normal by my interpretation.    COORDINATION OF CARE: 2:14 PM- Pt advised of plan for treatment which includes steroids and an inhaler and pt agrees. Pt refuses CXR.  Labs Review Labs Reviewed - No data to display  Imaging Review No results found.   EKG Interpretation None      MDM  Vitals stable - WNL -afebrile Pt resting comfortably in ED. Patient refuse CXR. States she feels better after DuoNeb treatment. She still has wheezing on lung auscultation. Refuses second DuoNeb treatment. States steroids are usually the only thing that works PE shows mild wheezing diffusely but no evidence of respiratory distress.  Will DC with albuterol inhaler as well as steroid taper. Smoking cessation discussed in depth. Discussed f/u with PCP and return precautions, pt very amenable to plan. Pt stable, in good condition and is appropriate for DC   Final diagnoses:  SOB (shortness of breath)      I personally performed the services described in this documentation, which was scribed in my presence. The recorded information has been reviewed and is accurate.      Verl Dicker, PA-C 08/26/14 [phone]

## 2014-08-27 NOTE — ED Provider Notes (Signed)
Medical screening examination/treatment/procedure(s) were performed by non-physician practitioner and as supervising physician I was immediately available for consultation/collaboration.     Mirna Mires, MD 08/27/14 1320

## 2014-09-01 ENCOUNTER — Encounter (HOSPITAL_COMMUNITY): Payer: Self-pay | Admitting: Emergency Medicine

## 2014-09-01 ENCOUNTER — Inpatient Hospital Stay (HOSPITAL_COMMUNITY)
Admission: EM | Admit: 2014-09-01 | Discharge: 2014-09-05 | DRG: 190 | Disposition: A | Discharge status: Home / Self Care | Payer: Self-pay | Attending: Internal Medicine | Admitting: Internal Medicine

## 2014-09-01 DIAGNOSIS — J4530 Mild persistent asthma, uncomplicated: Secondary | ICD-10-CM

## 2014-09-01 DIAGNOSIS — F17200 Nicotine dependence, unspecified, uncomplicated: Secondary | ICD-10-CM

## 2014-09-01 DIAGNOSIS — Z720 Tobacco use: Secondary | ICD-10-CM | POA: Diagnosis present

## 2014-09-01 DIAGNOSIS — E876 Hypokalemia: Secondary | ICD-10-CM | POA: Diagnosis present

## 2014-09-01 DIAGNOSIS — F329 Major depressive disorder, single episode, unspecified: Secondary | ICD-10-CM | POA: Diagnosis present

## 2014-09-01 DIAGNOSIS — J9601 Acute respiratory failure with hypoxia: Secondary | ICD-10-CM | POA: Diagnosis present

## 2014-09-01 DIAGNOSIS — R000 Tachycardia, unspecified: Secondary | ICD-10-CM | POA: Diagnosis present

## 2014-09-01 DIAGNOSIS — M7989 Other specified soft tissue disorders: Secondary | ICD-10-CM | POA: Diagnosis present

## 2014-09-01 DIAGNOSIS — Z6833 Body mass index (BMI) 33.0-33.9, adult: Secondary | ICD-10-CM

## 2014-09-01 DIAGNOSIS — F17210 Nicotine dependence, cigarettes, uncomplicated: Secondary | ICD-10-CM | POA: Diagnosis present

## 2014-09-01 DIAGNOSIS — G4739 Other sleep apnea: Secondary | ICD-10-CM | POA: Diagnosis present

## 2014-09-01 DIAGNOSIS — Z716 Tobacco abuse counseling: Secondary | ICD-10-CM | POA: Diagnosis present

## 2014-09-01 DIAGNOSIS — J441 Chronic obstructive pulmonary disease with (acute) exacerbation: Principal | ICD-10-CM | POA: Diagnosis present

## 2014-09-01 DIAGNOSIS — I10 Essential (primary) hypertension: Secondary | ICD-10-CM | POA: Diagnosis present

## 2014-09-01 DIAGNOSIS — E669 Obesity, unspecified: Secondary | ICD-10-CM | POA: Diagnosis present

## 2014-09-01 DIAGNOSIS — F32A Depression, unspecified: Secondary | ICD-10-CM

## 2014-09-01 DIAGNOSIS — R0602 Shortness of breath: Secondary | ICD-10-CM

## 2014-09-01 DIAGNOSIS — J45901 Unspecified asthma with (acute) exacerbation: Secondary | ICD-10-CM | POA: Diagnosis present

## 2014-09-01 MED ORDER — IPRATROPIUM BROMIDE 0.02 % IN SOLN
0.5000 mg | Freq: Once | RESPIRATORY_TRACT | Status: AC
  Administered 2014-09-01: 0.5 mg via RESPIRATORY_TRACT
  Filled 2014-09-01: qty 2.5

## 2014-09-01 MED ORDER — PREDNISONE 20 MG PO TABS
60.0000 mg | ORAL_TABLET | Freq: Once | ORAL | Status: AC
  Administered 2014-09-01: 60 mg via ORAL
  Filled 2014-09-01: qty 3

## 2014-09-01 MED ORDER — ALBUTEROL (5 MG/ML) CONTINUOUS INHALATION SOLN
10.0000 mg/h | INHALATION_SOLUTION | Freq: Once | RESPIRATORY_TRACT | Status: AC
  Administered 2014-09-01: 10 mg/h via RESPIRATORY_TRACT
  Filled 2014-09-01: qty 20

## 2014-09-01 NOTE — ED Notes (Signed)
Pt presents with cough and shortness of breath, was here recently for same. Pt states she completed round of steroids 2 -3 days ago, pt states she is using neb more than normal, pt states her COPD flares are not usually this bad.

## 2014-09-01 NOTE — ED Notes (Signed)
Patient is out of inhaler Advair and needs a prescription for it. She states that she is having trouble breathing for the last two weeks.

## 2014-09-02 ENCOUNTER — Encounter (HOSPITAL_COMMUNITY): Payer: Self-pay | Admitting: Internal Medicine

## 2014-09-02 ENCOUNTER — Emergency Department (HOSPITAL_COMMUNITY): Payer: BC Managed Care – PPO

## 2014-09-02 DIAGNOSIS — R000 Tachycardia, unspecified: Secondary | ICD-10-CM

## 2014-09-02 DIAGNOSIS — F329 Major depressive disorder, single episode, unspecified: Secondary | ICD-10-CM

## 2014-09-02 DIAGNOSIS — E876 Hypokalemia: Secondary | ICD-10-CM

## 2014-09-02 DIAGNOSIS — J441 Chronic obstructive pulmonary disease with (acute) exacerbation: Principal | ICD-10-CM

## 2014-09-02 DIAGNOSIS — Z720 Tobacco use: Secondary | ICD-10-CM

## 2014-09-02 DIAGNOSIS — J45901 Unspecified asthma with (acute) exacerbation: Secondary | ICD-10-CM | POA: Diagnosis present

## 2014-09-02 LAB — COMPREHENSIVE METABOLIC PANEL
ALK PHOS: 94 U/L (ref 39–117)
ALT: 25 U/L (ref 0–35)
AST: 24 U/L (ref 0–37)
Albumin: 3.6 g/dL (ref 3.5–5.2)
Anion gap: 15 (ref 5–15)
BUN: 16 mg/dL (ref 6–23)
CHLORIDE: 103 meq/L (ref 96–112)
CO2: 23 meq/L (ref 19–32)
Calcium: 9 mg/dL (ref 8.4–10.5)
Creatinine, Ser: 0.7 mg/dL (ref 0.50–1.10)
GFR calc Af Amer: >90 mL/min (ref >90)
Glucose, Bld: 145 mg/dL — ABNORMAL HIGH (ref 70–99)
POTASSIUM: 3.5 meq/L — AB (ref 3.7–5.3)
SODIUM: 141 meq/L (ref 137–147)
Total Protein: 6.7 g/dL (ref 6.0–8.3)

## 2014-09-02 LAB — I-STAT CHEM 8, ED
BUN: 15 mg/dL (ref 6–23)
CALCIUM ION: 1.21 mmol/L (ref 1.12–1.23)
CREATININE: 0.9 mg/dL (ref 0.50–1.10)
Chloride: 105 mEq/L (ref 96–112)
Glucose, Bld: 114 mg/dL — ABNORMAL HIGH (ref 70–99)
HCT: 39 % (ref 36.0–46.0)
Hemoglobin: 13.3 g/dL (ref 12.0–15.0)
Potassium: 3.6 mEq/L — ABNORMAL LOW (ref 3.7–5.3)
Sodium: 142 mEq/L (ref 137–147)
TCO2: 25 mmol/L (ref 0–100)

## 2014-09-02 LAB — CBC WITH DIFFERENTIAL/PLATELET
BASOS PCT: 0 % (ref 0–1)
BASOS PCT: 1 % (ref 0–1)
Basophils Absolute: 0 10*3/uL (ref 0.0–0.1)
Basophils Absolute: 0.1 10*3/uL (ref 0.0–0.1)
EOS PCT: 8 % — AB (ref 0–5)
Eosinophils Absolute: 0.1 10*3/uL (ref 0.0–0.7)
Eosinophils Absolute: 0.5 10*3/uL (ref 0.0–0.7)
Eosinophils Relative: 1 % (ref 0–5)
HCT: 37.5 % (ref 36.0–46.0)
HEMATOCRIT: 38.1 % (ref 36.0–46.0)
HEMOGLOBIN: 12.5 g/dL (ref 12.0–15.0)
Hemoglobin: 12.4 g/dL (ref 12.0–15.0)
LYMPHS ABS: 0.9 10*3/uL (ref 0.7–4.0)
Lymphocytes Relative: 14 % (ref 12–46)
Lymphocytes Relative: 35 % (ref 12–46)
Lymphs Abs: 2.5 10*3/uL (ref 0.7–4.0)
MCH: 30.1 pg (ref 26.0–34.0)
MCH: 30.5 pg (ref 26.0–34.0)
MCHC: 32.8 g/dL (ref 30.0–36.0)
MCHC: 33.1 g/dL (ref 30.0–36.0)
MCV: 91 fL (ref 78.0–100.0)
MCV: 92.9 fL (ref 78.0–100.0)
MONO ABS: 0.5 10*3/uL (ref 0.1–1.0)
MONOS PCT: 8 % (ref 3–12)
Monocytes Absolute: 0.2 10*3/uL (ref 0.1–1.0)
Monocytes Relative: 3 % (ref 3–12)
Neutro Abs: 3.6 10*3/uL (ref 1.7–7.7)
Neutro Abs: 5.4 10*3/uL (ref 1.7–7.7)
Neutrophils Relative %: 50 % (ref 43–77)
Neutrophils Relative %: 82 % — ABNORMAL HIGH (ref 43–77)
PLATELETS: 271 10*3/uL (ref 150–400)
Platelets: 276 10*3/uL (ref 150–400)
RBC: 4.1 MIL/uL (ref 3.87–5.11)
RBC: 4.12 MIL/uL (ref 3.87–5.11)
RDW: 12.8 % (ref 11.5–15.5)
RDW: 12.9 % (ref 11.5–15.5)
WBC: 6.5 10*3/uL (ref 4.0–10.5)
WBC: 7.2 10*3/uL (ref 4.0–10.5)

## 2014-09-02 LAB — I-STAT TROPONIN, ED: Troponin i, poc: 0 ng/mL (ref 0.00–0.08)

## 2014-09-02 LAB — PRO B NATRIURETIC PEPTIDE: PRO B NATRI PEPTIDE: 61.7 pg/mL (ref 0–125)

## 2014-09-02 MED ORDER — ZINC SULFATE 220 (50 ZN) MG PO CAPS
220.0000 mg | ORAL_CAPSULE | Freq: Every day | ORAL | Status: DC
  Administered 2014-09-02 – 2014-09-05 (×4): 220 mg via ORAL
  Filled 2014-09-02 (×4): qty 1

## 2014-09-02 MED ORDER — VITAMIN D 1000 UNITS PO TABS
15000.0000 [IU] | ORAL_TABLET | Freq: Every day | ORAL | Status: DC
  Administered 2014-09-02 – 2014-09-03 (×2): 15000 [IU] via ORAL
  Filled 2014-09-02 (×4): qty 15

## 2014-09-02 MED ORDER — ALBUTEROL SULFATE (2.5 MG/3ML) 0.083% IN NEBU
5.0000 mg | INHALATION_SOLUTION | Freq: Once | RESPIRATORY_TRACT | Status: AC
  Administered 2014-09-02: 5 mg via RESPIRATORY_TRACT
  Filled 2014-09-02: qty 6

## 2014-09-02 MED ORDER — ACETAMINOPHEN 325 MG PO TABS
650.0000 mg | ORAL_TABLET | Freq: Four times a day (QID) | ORAL | Status: DC | PRN
  Administered 2014-09-02 – 2014-09-04 (×5): 650 mg via ORAL
  Filled 2014-09-02 (×5): qty 2

## 2014-09-02 MED ORDER — ONDANSETRON HCL 4 MG/2ML IJ SOLN: 4.0000 mg | Freq: Four times a day (QID) | INTRAMUSCULAR | Status: DC | PRN

## 2014-09-02 MED ORDER — PAROXETINE HCL 20 MG PO TABS
40.0000 mg | ORAL_TABLET | Freq: Every day | ORAL | Status: DC
  Administered 2014-09-02 – 2014-09-05 (×4): 40 mg via ORAL
  Filled 2014-09-02 (×4): qty 2

## 2014-09-02 MED ORDER — ALBUTEROL SULFATE (2.5 MG/3ML) 0.083% IN NEBU: 2.5000 mg | INHALATION_SOLUTION | RESPIRATORY_TRACT | Status: DC | PRN

## 2014-09-02 MED ORDER — IPRATROPIUM-ALBUTEROL 0.5-2.5 (3) MG/3ML IN SOLN
3.0000 mL | RESPIRATORY_TRACT | Status: DC
  Administered 2014-09-02 – 2014-09-04 (×8): 3 mL via RESPIRATORY_TRACT
  Filled 2014-09-02 (×8): qty 3

## 2014-09-02 MED ORDER — SODIUM CHLORIDE 0.9 % IJ SOLN
3.0000 mL | Freq: Two times a day (BID) | INTRAMUSCULAR | Status: DC
  Administered 2014-09-02 – 2014-09-05 (×8): 3 mL via INTRAVENOUS

## 2014-09-02 MED ORDER — ACETAMINOPHEN 650 MG RE SUPP: 650.0000 mg | Freq: Four times a day (QID) | RECTAL | Status: DC | PRN

## 2014-09-02 MED ORDER — AZITHROMYCIN 250 MG PO TABS
500.0000 mg | ORAL_TABLET | Freq: Once | ORAL | Status: AC
  Administered 2014-09-02: 500 mg via ORAL
  Filled 2014-09-02: qty 2

## 2014-09-02 MED ORDER — LEVOFLOXACIN IN D5W 750 MG/150ML IV SOLN
750.0000 mg | INTRAVENOUS | Status: DC
  Administered 2014-09-02 – 2014-09-05 (×4): 750 mg via INTRAVENOUS
  Filled 2014-09-02 (×4): qty 150

## 2014-09-02 MED ORDER — ENOXAPARIN SODIUM 40 MG/0.4ML ~~LOC~~ SOLN
40.0000 mg | SUBCUTANEOUS | Status: DC
  Administered 2014-09-02 – 2014-09-05 (×4): 40 mg via SUBCUTANEOUS
  Filled 2014-09-02 (×4): qty 0.4

## 2014-09-02 MED ORDER — IPRATROPIUM-ALBUTEROL 0.5-2.5 (3) MG/3ML IN SOLN
3.0000 mL | RESPIRATORY_TRACT | Status: DC
Start: 2014-09-02 — End: 2014-09-02
  Administered 2014-09-02 (×5): 3 mL via RESPIRATORY_TRACT
  Filled 2014-09-02 (×5): qty 3

## 2014-09-02 MED ORDER — IPRATROPIUM BROMIDE 0.02 % IN SOLN: 0.5000 mg | RESPIRATORY_TRACT | Status: DC

## 2014-09-02 MED ORDER — POTASSIUM CHLORIDE CRYS ER 20 MEQ PO TBCR
40.0000 meq | EXTENDED_RELEASE_TABLET | Freq: Once | ORAL | Status: AC
  Administered 2014-09-02: 40 meq via ORAL
  Filled 2014-09-02: qty 2

## 2014-09-02 MED ORDER — ALBUTEROL SULFATE (2.5 MG/3ML) 0.083% IN NEBU: 2.5000 mg | INHALATION_SOLUTION | RESPIRATORY_TRACT | Status: DC

## 2014-09-02 MED ORDER — BUDESONIDE 0.25 MG/2ML IN SUSP
0.2500 mg | Freq: Two times a day (BID) | RESPIRATORY_TRACT | Status: DC
  Administered 2014-09-02 – 2014-09-05 (×7): 0.25 mg via RESPIRATORY_TRACT
  Filled 2014-09-02 (×8): qty 2

## 2014-09-02 MED ORDER — ALBUTEROL SULFATE (2.5 MG/3ML) 0.083% IN NEBU: 2.5000 mg | INHALATION_SOLUTION | Freq: Four times a day (QID) | RESPIRATORY_TRACT | Status: DC | PRN

## 2014-09-02 MED ORDER — ONDANSETRON HCL 4 MG PO TABS: 4.0000 mg | ORAL_TABLET | Freq: Four times a day (QID) | ORAL | Status: DC | PRN

## 2014-09-02 NOTE — ED Provider Notes (Signed)
CSN: [id number]     Arrival date & time 09/01/14  2241 History   First MD Initiated Contact with Patient 09/01/14 2330     Chief Complaint  Patient presents with  . Shortness of Breath     (Consider location/radiation/quality/duration/timing/severity/associated sxs/prior Treatment) HPI Lori Owens is a 53 y.o. femalePresents to emergency department complaining of shortness of breath. Patient states she has COPD, and has had worsening shortness of breath for last 2 weeks. She denies any history of heart problems. Denies any recent travel surgeries. No history of blood clots. She states she was seen in emergency department a week ago and was treated with inhalers, breathing treatments, prednisone, states finished all and still doing treatments twice a day. States that her shortness of breath is worsening. She reports no fever or chills. No nasal congestion or sore throat. States she is having a dry nonproductive cough. States symptoms are worsened on exertion.  Past Medical History  Diagnosis Date  . Depression 01/18/2013  . Tobacco abuse 01/18/2013  . Headache(784.0)   . COPD (chronic obstructive pulmonary disease)   . Heart murmur   . Cancer    Past Surgical History  Procedure Laterality Date  . Throat surgery    . Abdominal hysterectomy    . Nose surgery      polyp removal from nasal cavity   . Tonsillectomy     Family History  Problem Relation Age of Onset  . Diabetes Maternal Grandfather   . Heart disease Maternal Grandfather   . Cancer Maternal Grandfather   . Hypertension Maternal Grandfather    History  Substance Use Topics  . Smoking status: Current Every Day Smoker -- 0.50 packs/day    Last Attempt to Quit: 01/18/2013  . Smokeless tobacco: Never Used  . Alcohol Use: No   OB History   Grav Para Term Preterm Abortions TAB SAB Ect Mult Living                 Review of Systems  Constitutional: Negative for fever and chills.  Respiratory: Positive for cough,  chest tightness, shortness of breath and wheezing.   Cardiovascular: Negative for chest pain, palpitations and leg swelling.  Gastrointestinal: Negative for nausea, vomiting, abdominal pain and diarrhea.  Genitourinary: Negative for dysuria, flank pain and pelvic pain.  Musculoskeletal: Negative for arthralgias, myalgias, neck pain and neck stiffness.  Skin: Negative for rash.  Neurological: Negative for dizziness, weakness and headaches.  All other systems reviewed and are negative.     Allergies  Review of patient's allergies indicates no known allergies.  Home Medications   Prior to Admission medications   Medication Sig Start Date End Date Taking? Authorizing Provider  albuterol (PROVENTIL) (5 MG/ML) 0.5% nebulizer solution Take 2.5 mg by nebulization every 6 (six) hours as needed for wheezing or shortness of breath (COPD).   Yes Historical Provider, MD  BIOTIN PO Take 1,000 mg by mouth daily.   Yes Historical Provider, MD  cholecalciferol (VITAMIN D) 1000 UNITS tablet Take 15,000 Units by mouth daily.   Yes Historical Provider, MD  IRON PO Take 1,000 mg by mouth daily.   Yes Historical Provider, MD  mometasone-formoterol (DULERA) 100-5 MCG/ACT AERO Inhale 2 puffs into the lungs 2 (two) times daily.   Yes Historical Provider, MD  PARoxetine (PAXIL) 40 MG tablet Take 40 mg by mouth every morning.   Yes Historical Provider, MD  Probiotic Product (PROBIOTIC PO) Take 50,000 Units by mouth daily.   Yes Historical Provider, MD  Zinc 50 MG TABS Take 1 tablet by mouth daily.   Yes Historical Provider, MD  predniSONE (DELTASONE) 20 MG tablet 3 tabs po day one, then 2 tabs daily x 4 days 08/25/14   Viona Gilmore Cartner, PA-C  Vitamin D, Ergocalciferol, (DRISDOL) 50000 UNITS CAPS capsule Take 150,000 Units by mouth daily.    Historical Provider, MD   BP 155/96  Pulse 89  Temp(Src) 98.2 F (36.8 C) (Oral)  Resp 18  Ht 5\' 6"  (1.676 m)  Wt 204 lb (92.534 kg)  BMI 32.94 kg/m2  SpO2  97% Physical Exam  Nursing note and vitals reviewed. Constitutional: She appears well-developed and well-nourished. No distress.  HENT:  Head: Normocephalic.  Eyes: Conjunctivae are normal.  Neck: Neck supple.  Cardiovascular: Normal rate, regular rhythm and normal heart sounds.   Pulmonary/Chest: Effort normal. No respiratory distress. She has wheezes. She has no rales.  Decreased air movement bilaterally, inspiratory and expiratory wheezes in all lung fields.  Abdominal: Soft. Bowel sounds are normal. She exhibits no distension. There is no tenderness. There is no rebound.  Musculoskeletal: She exhibits no edema.  Tenderness, negative Homans sign bilaterally  Neurological: She is alert.  Skin: Skin is warm and dry.  Psychiatric: She has a normal mood and affect. Her behavior is normal.    ED Course  Procedures (including critical care time) Labs Review Labs Reviewed  CBC WITH DIFFERENTIAL  I-STAT [place], ED  I-STAT CHEM 8, ED    Imaging Review No results found.   Date: 09/02/2014  Rate: 81  Rhythm: normal sinus rhythm  QRS Axis: normal  Intervals: normal  ST/T Wave abnormalities: normal  Conduction Disutrbances:none  Narrative Interpretation:   Old EKG Reviewed: none available    MDM   Final diagnoses:  Shortness of breath  COPD exacerbation    Patient here with COPD exacerbation. Worsening symptoms for 2 weeks. Treated with prednisone course and breathing treatments a week ago. She did run out of Advair, will refill. Will start again on prednisone, will try hour-long neb at this time. Patient does have inspiratory expiratory wheezes bilaterally. Will also get chest x-ray. Patient apparently refused chest x-ray when she was here last time. Patient's vital signs are normal at this time.  1:20 AM Labs unremarkable. Pt reassessed after 1hr tx. Continues to wheeze. ambualted with pulse ox, dropped to 89%, became SOB, tachycardic.   Spoke with [facility], will  admit   Filed Vitals:   09/01/14 2359 09/02/14 0000 09/02/14 0030 09/02/14 0100  BP:  149/101 149/83   Pulse:  80 87   Temp:      TempSrc:      Resp:      Height:      Weight:      SpO2: 97% 100% 99% 94%     Soua Lenk A Novalynn Branaman, PA-C 09/02/14 0131

## 2014-09-02 NOTE — Progress Notes (Signed)
Pt arrived to floor room 1519 via stretcher. A and OX4. VS taken and pt oriented to room with no complaints. Gait unsteady and general weakness. Initial assessment completed, Will continue to monitor through shift. Minimal pain to R knee 2/10

## 2014-09-02 NOTE — ED Notes (Signed)
Called respiratory to bedside. 

## 2014-09-02 NOTE — H&P (Signed)
[facility] History and Physical  Jaleea Alesi DQQ:[id number] DOB: 1961-09-04 DOA: 09/01/2014  Referring physician: ER physician. PCP: Chari Manning, NP   Chief Complaint: Shortness of breath.  HPI: Lori Owens is a 53 y.o. female with history of COPD and depression and ongoing tobacco abuse presents to the ER because of worsening shortness of breath. Patient states she has been having wheezing and productive cough for last 10 days and had come to the ER last week and was prescribed steroids and despite taking which patient is still short of breath. In the ER patient was found to be easily tachycardic and hypoxic on exertion. On exam patient is having bilateral wheezing. Patient has been admitted for further management for COPD exacerbation. Patient also has noticed mild swelling in the right lower extremity. Patient denies any chest pain fever chills nausea vomiting abdominal pain or diarrhea.   Review of Systems: As presented in the history of presenting illness, rest negative.  Past Medical History  Diagnosis Date  . Depression 01/18/2013  . Tobacco abuse 01/18/2013  . Headache(784.0)   . COPD (chronic obstructive pulmonary disease)   . Heart murmur   . Cancer    Past Surgical History  Procedure Laterality Date  . Throat surgery    . Abdominal hysterectomy    . Nose surgery      polyp removal from nasal cavity   . Tonsillectomy     Social History:  reports that she has been smoking.  She has never used smokeless tobacco. She reports that she does not drink alcohol or use illicit drugs. Where does patient live home. Can patient participate in ADLs? Yes.  No Known Allergies  Family History:  Family History  Problem Relation Age of Onset  . Diabetes Maternal Grandfather   . Heart disease Maternal Grandfather   . Cancer Maternal Grandfather   . Hypertension Maternal Grandfather       Prior to Admission medications   Medication Sig Start Date End Date Taking?  Authorizing Provider  albuterol (PROVENTIL) (5 MG/ML) 0.5% nebulizer solution Take 2.5 mg by nebulization every 6 (six) hours as needed for wheezing or shortness of breath (COPD).   Yes Historical Provider, MD  BIOTIN PO Take 1,000 mg by mouth daily.   Yes Historical Provider, MD  cholecalciferol (VITAMIN D) 1000 UNITS tablet Take 15,000 Units by mouth daily.   Yes Historical Provider, MD  IRON PO Take 1,000 mg by mouth daily.   Yes Historical Provider, MD  mometasone-formoterol (DULERA) 100-5 MCG/ACT AERO Inhale 2 puffs into the lungs 2 (two) times daily.   Yes Historical Provider, MD  PARoxetine (PAXIL) 40 MG tablet Take 40 mg by mouth every morning.   Yes Historical Provider, MD  Probiotic Product (PROBIOTIC PO) Take 50,000 Units by mouth daily.   Yes Historical Provider, MD  Zinc 50 MG TABS Take 1 tablet by mouth daily.   Yes Historical Provider, MD  predniSONE (DELTASONE) 20 MG tablet 3 tabs po day one, then 2 tabs daily x 4 days 08/25/14   Viona Gilmore Cartner, PA-C  Vitamin D, Ergocalciferol, (DRISDOL) 50000 UNITS CAPS capsule Take 150,000 Units by mouth daily.    Historical Provider, MD    Physical Exam: Filed Vitals:   09/02/14 0100 09/02/14 0132 09/02/14 0159 09/02/14 0227  BP:   126/71 155/96  Pulse:   101 100  Temp:   97.9 F (36.6 C) 97.6 F (36.4 C)  TempSrc:   Oral Oral  Resp:   18 20  Height:    5\' 6"  (1.676 m)  Weight:    94.802 kg (209 lb)  SpO2: 94% 90% 86% 91%     General:  Well-developed and nourished.  Eyes: Anicteric no pallor.  ENT: No discharge from the ears eyes nose mouth.  Neck: No mass felt. JVD not appreciated.  Cardiovascular: S1-S2 heard.  Respiratory: Bilateral expiratory wheezes are no crepitations.  Abdomen: Soft nontender bowel sounds present. No guarding or rigidity.  Skin: No rash.  Musculoskeletal: Mild edema in the right lower extremity.  Psychiatric: Appears normal.  Neurologic: Alert awake oriented to time place and person.  Moves all extremities.  Labs on Admission:  Basic Metabolic Panel:  Recent Labs Lab 09/02/14 0022  NA 142  K 3.6*  CL 105  GLUCOSE 114*  BUN 15  CREATININE 0.90   Liver Function Tests: No results found for this basename: AST, ALT, ALKPHOS, BILITOT, PROT, ALBUMIN,  in the last 168 hours No results found for this basename: LIPASE, AMYLASE,  in the last 168 hours No results found for this basename: AMMONIA,  in the last 168 hours CBC:  Recent Labs Lab 09/01/14 2355 09/02/14 0022  WBC 7.2  --   NEUTROABS 3.6  --   HGB 12.5 13.3  HCT 38.1 39.0  MCV 92.9  --   PLT 276  --    Cardiac Enzymes: No results found for this basename: CKTOTAL, CKMB, CKMBINDEX, TROPONINI,  in the last 168 hours  BNP (last 3 results) No results found for this basename: PROBNP,  in the last 8760 hours CBG: No results found for this basename: GLUCAP,  in the last 168 hours  Radiological Exams on Admission: Dg Chest 2 View  09/02/2014   CLINICAL DATA:  Cough, shortness of breath, COPD.  EXAM: CHEST  2 VIEW  COMPARISON:  11/05/2013  FINDINGS: Mild interstitial coarsening/peribronchial thickening. No confluent airspace opacity, pleural effusion, or pneumothorax. Cardiomediastinal contours within normal range. No acute osseous finding.  IMPRESSION: Mild peribronchial thickening, may reflect acute and/or chronic bronchitis.   Electronically Signed   By: Carlos Levering M.D.   On: 09/02/2014 01:24    EKG: Independently reviewed. Normal sinus rhythm.  Assessment/Plan Principal Problem:   COPD exacerbation Active Problems:   Depression   Tobacco abuse   1. COPD exacerbation - patient has been placed on nebulizer Pulmicort IV steroids and antibiotics. Since patient has noticed some lower extremity swelling Dopplers and 2-D echo has been ordered. 2. Depression - continue home medications presently has no suicidal or homicidal thoughts. 3. Tobacco abuse - patient advised to quit smoking.    Code  Status: Full code.  Family Communication: None.  Disposition Plan: Admit to inpatient.    Montarius Kitagawa N. [facility] Pager [phone].  If 7PM-7AM, please contact night-coverage [email or web address] Password [email or web address] 09/02/2014, 2:59 AM

## 2014-09-02 NOTE — ED Notes (Signed)
Pt ambulated with some short of breath, HR 108 SPO2 91 RN notified MD notified

## 2014-09-02 NOTE — ED Provider Notes (Signed)
Medical screening examination/treatment/procedure(s) were performed by non-physician practitioner and as supervising physician I was immediately available for consultation/collaboration.   Delora Fuel, MD 39/[id number]

## 2014-09-02 NOTE — Progress Notes (Signed)
ANTIBIOTIC CONSULT NOTE - INITIAL  Pharmacy Consult for  Levaquin Indication: COPD   No Known Allergies  Patient Measurements: Height: 5\' 6"  (167.6 cm) Weight: 209 lb (94.802 kg) IBW/kg (Calculated) : 59.3   Vital Signs: Temp: 97.6 F (36.4 C) (10/26 0227) Temp Source: Oral (10/26 0227) BP: 155/96 mmHg (10/26 0227) Pulse Rate: 100 (10/26 0227) Intake/Output from previous day:   Intake/Output from this shift:    Labs:  Recent Labs  09/01/14 2355 09/02/14 0022  WBC 7.2  --   HGB 12.5 13.3  PLT 276  --   CREATININE  --  0.90   Estimated Creatinine Clearance: 84.8 ml/min (by C-G formula based on Cr of 0.9). No results found for this basename: VANCOTROUGH, VANCOPEAK, VANCORANDOM, GENTTROUGH, GENTPEAK, GENTRANDOM, TOBRATROUGH, TOBRAPEAK, TOBRARND, AMIKACINPEAK, AMIKACINTROU, AMIKACIN,  in the last 72 hours   Microbiology: No results found for this or any previous visit (from the past 720 hour(s)).  Medical History: Past Medical History  Diagnosis Date  . Depression 01/18/2013  . Tobacco abuse 01/18/2013  . Headache(784.0)   . COPD (chronic obstructive pulmonary disease)   . Heart murmur   . Cancer     Medications:  Scheduled:  . albuterol  2.5 mg Nebulization Q4H  . budesonide (PULMICORT) nebulizer solution  0.25 mg Nebulization BID  . cholecalciferol  15,000 Units Oral Daily  . enoxaparin (LOVENOX) injection  40 mg Subcutaneous Q24H  . ipratropium  0.5 mg Nebulization Q4H  . PARoxetine  40 mg Oral Daily  . sodium chloride  3 mL Intravenous Q12H  . zinc sulfate  220 mg Oral Daily   Infusions:   Assessment: 38 yoF c/o SOB.  Levaquin per Rx for COPD exacerbation.   Goal of Therapy:  Treat infection  Plan:   Levaquin 750mg  IV q24h.  F/U Scr/cultures  Lawana Pai R 09/02/2014,3:15 AM

## 2014-09-02 NOTE — Progress Notes (Addendum)
   [facility]                                                                              Patient Demographics  Lori Owens, is a 53 y.o. female, DOB - 08/16/1961, SWH:[id number]  Admit date - 09/01/2014   Admitting Physician Rise Patience, MD  Outpatient Primary MD for the patient is Chari Manning, NP  LOS - 1   Chief Complaint  Patient presents with  . Shortness of Breath        Assessment & Plan   Patient admitted early this morning by Dr. Hal Hope.  Please refer to his full H&P.  Agree with current assessment and plan.   COPD exacerbation/Bronchitis -Patient was hypoxic upon arrival to emergency department 86% on room air -Possibly secondary to multifactorial causes including nicotine abuse, line of work (cleaning service) -Chest x-ray: Mild peribronchial thickening, may reflect acute and/or chronic bronchitis. -Continue nebulizers, antibiotics, steroids -Patient counseled on smoking cessation -Currently afebrile with no leukocytosis  Lower extremity swelling -R>L -Patient had right lower extremity Doppler on 08/08/2014: Negative for DVT -Pending echocardiogram  (unlikely CHF, BNP 61) -Possibly has undiagnosed sleep apnea (per husband, patient snores like "freight train") -Urged patient to have sleep study conducted once discharged  Tobacco abuse -Counseled patient on smoking cessation.  Hypokalemia -Will repeat and continue to monitor BMP -Will check magnesium level  Code Status: Full  Family Communication: husband at bedside  Disposition Plan: Admitted  Time Spent in minutes   30 minutes  Procedures  None  Consults   None  DVT Prophylaxis  Lovenox  [name] D.O. on 09/02/2014 at 2:59 PM  Between 7am to 7pm - Pager - [phone]  After 7pm go to [email or web address] - password [email or web address]  And look for the night coverage person covering for me after hours  [facility] Office  [phone]

## 2014-09-02 NOTE — Progress Notes (Signed)
Nutrition Brief Note  Patient identified on the Malnutrition Screening Tool (MST) Report  Wt Readings from Last 15 Encounters:  09/02/14 209 lb (94.802 kg)  06/06/14 210 lb (95.255 kg)  11/05/13 209 lb (94.802 kg)  10/12/13 214 lb (97.07 kg)  08/16/13 213 lb (96.616 kg)  07/04/13 210 lb 9.6 oz (95.528 kg)  05/22/13 204 lb 6.4 oz (92.715 kg)  04/19/13 199 lb (90.266 kg)  04/09/13 197 lb (89.359 kg)  01/20/13 197 lb 8 oz (89.585 kg)  08/06/12 195 lb (88.451 kg)    Body mass index is 33.75 kg/(m^2). Patient meets criteria for obesity based on current BMI.   Current diet order is regular. Pt's diet was advanced today. Spoke with pt regarding weight history. Pt reports 15 lb weight loss but also a UBW of 195-205 which would indicate a small amount of weight gain compared to current body weight of 209 lb. Pt is not concerned about weight changes at this time. Pt reports eating regularly 2-3 meals.  Pt just started a new job and is more physically active with her new position she also states she has not been late night snacking with ice cream and popcorn like usual.  Labs and medications reviewed.   No nutrition interventions warranted at this time. If nutrition issues arise, please consult RD.   Clayton Bibles, MS, RD, LDN Pager: [phone] After Hours Pager: [phone]

## 2014-09-03 DIAGNOSIS — I517 Cardiomegaly: Secondary | ICD-10-CM

## 2014-09-03 DIAGNOSIS — R0602 Shortness of breath: Secondary | ICD-10-CM

## 2014-09-03 DIAGNOSIS — J4530 Mild persistent asthma, uncomplicated: Secondary | ICD-10-CM

## 2014-09-03 DIAGNOSIS — J9601 Acute respiratory failure with hypoxia: Secondary | ICD-10-CM

## 2014-09-03 LAB — CBC
HCT: 38.1 % (ref 36.0–46.0)
HEMOGLOBIN: 12.4 g/dL (ref 12.0–15.0)
MCH: 30.4 pg (ref 26.0–34.0)
MCHC: 32.5 g/dL (ref 30.0–36.0)
MCV: 93.4 fL (ref 78.0–100.0)
Platelets: 277 10*3/uL (ref 150–400)
RBC: 4.08 MIL/uL (ref 3.87–5.11)
RDW: 13 % (ref 11.5–15.5)
WBC: 7.1 10*3/uL (ref 4.0–10.5)

## 2014-09-03 LAB — BASIC METABOLIC PANEL
Anion gap: 11 (ref 5–15)
BUN: 15 mg/dL (ref 6–23)
CHLORIDE: 103 meq/L (ref 96–112)
CO2: 25 mEq/L (ref 19–32)
Calcium: 9.3 mg/dL (ref 8.4–10.5)
Creatinine, Ser: 0.65 mg/dL (ref 0.50–1.10)
GFR calc Af Amer: >90 mL/min (ref >90)
GLUCOSE: 110 mg/dL — AB (ref 70–99)
POTASSIUM: 4.3 meq/L (ref 3.7–5.3)
SODIUM: 139 meq/L (ref 137–147)

## 2014-09-03 LAB — MAGNESIUM: MAGNESIUM: 2 mg/dL (ref 1.5–2.5)

## 2014-09-03 MED ORDER — METHYLPREDNISOLONE SODIUM SUCC 125 MG IJ SOLR
80.0000 mg | Freq: Three times a day (TID) | INTRAMUSCULAR | Status: AC
  Administered 2014-09-03 – 2014-09-04 (×4): 80 mg via INTRAVENOUS
  Filled 2014-09-03 (×6): qty 1.28

## 2014-09-03 MED ORDER — GUAIFENESIN ER 600 MG PO TB12
600.0000 mg | ORAL_TABLET | Freq: Two times a day (BID) | ORAL | Status: DC
  Administered 2014-09-03 – 2014-09-05 (×4): 600 mg via ORAL
  Filled 2014-09-03 (×5): qty 1

## 2014-09-03 NOTE — Progress Notes (Signed)
Echocardiogram 2D Echocardiogram has been performed.  Lori Owens 09/03/2014, 11:52 AM

## 2014-09-03 NOTE — Progress Notes (Signed)
PROGRESS NOTE  Lori Owens IOE:[id number] DOB: 01-16-1961 DOA: 09/01/2014 PCP: Chari Manning, NP  Assessment/Plan: Acute hypoxemic respiratory failure -Secondary to COPD exacerbation -Presently stable on room air, but was hypoxemic upon arrival to emergency department 86% on room air COPD exacerbation/Bronchitis  -Patient was hypoxic upon arrival to emergency department 86% on room air  -Possibly secondary to multifactorial causes including nicotine abuse, line of work (cleaning service)  -Chest x-ray: Mild peribronchial thickening, may reflect acute and/or chronic bronchitis.  -Continue nebulizers, antibiotics -09/03/14--restart IV steroids -Patient counseled on smoking cessation  -Currently afebrile with no leukocytosis -add mucinex  Lower extremity swelling  -R>L-->improved -Patient had right lower extremity Doppler on 08/08/2014: Negative for DVT  -echocardiogram--EF 18-29%, grade 1 diastolic dysfunction, no WMA -Possibly has undiagnosed sleep apnea (per husband, patient snores like "freight train"  -Urged patient to have sleep study conducted once discharged  Tobacco abuse  -Counseled patient on smoking cessation.  Hypokalemia  -repleted -check magnesium level--2.0   Family Communication:   Pt at beside Disposition Plan:   Home when medically stable       Procedures/Studies: Dg Chest 2 View  09/02/2014   CLINICAL DATA:  Cough, shortness of breath, COPD.  EXAM: CHEST  2 VIEW  COMPARISON:  11/05/2013  FINDINGS: Mild interstitial coarsening/peribronchial thickening. No confluent airspace opacity, pleural effusion, or pneumothorax. Cardiomediastinal contours within normal range. No acute osseous finding.  IMPRESSION: Mild peribronchial thickening, may reflect acute and/or chronic bronchitis.   Electronically Signed   By: Carlos Levering M.D.   On: 09/02/2014 01:24         Subjective: Patient is breathing a little bit better than the day of admission  but continues to complain of nonproductive cough and dyspnea with exertion. Denies any fevers, chills, chest pain, nausea, vomiting, diarrhea, abdominal pain, dysuria, hematuria. No rashes. No headache.  Objective: Filed Vitals:   09/03/14 0805 09/03/14 1242 09/03/14 1417 09/03/14 1534  BP: 145/91 155/97 155/95   Pulse: 92 85 90   Temp: 98.1 F (36.7 C) 98.2 F (36.8 C) 98.5 F (36.9 C)   TempSrc: Oral Oral Oral   Resp: 18 20 18    Height:      Weight:      SpO2: 96% 94% 93% 92%    Intake/Output Summary (Last 24 hours) at 09/03/14 1615 Last data filed at 09/03/14 1300  Gross per 24 hour  Intake    480 ml  Output      0 ml  Net    480 ml   Weight change:  Exam:   General:  Pt is alert, follows commands appropriately, not in acute distress  HEENT: No icterus, No thrush,[place]/AT  Cardiovascular: RRR, S1/S2, no rubs, no gallops  Respiratory: Bilateral expiratory wheeze. Good movement.  Abdomen: Soft/+BS, non tender, non distended, no guarding  Extremities: No edema, No lymphangitis, No petechiae, No rashes, no synovitis  Data Reviewed: Basic Metabolic Panel:  Recent Labs Lab 09/02/14 0022 09/02/14 0320 09/03/14 0451  NA 142 141 139  K 3.6* 3.5* 4.3  CL 105 103 103  CO2  --  23 25  GLUCOSE 114* 145* 110*  BUN 15 16 15   CREATININE 0.90 0.70 0.65  CALCIUM  --  9.0 9.3  MG  --   --  2.0   Liver Function Tests:  Recent Labs Lab 09/02/14 0320  AST 24  ALT 25  ALKPHOS 94  BILITOT <0.2*  PROT 6.7  ALBUMIN  3.6   No results found for this basename: LIPASE, AMYLASE,  in the last 168 hours No results found for this basename: AMMONIA,  in the last 168 hours CBC:  Recent Labs Lab 09/01/14 2355 09/02/14 0022 09/02/14 0320 09/03/14 0451  WBC 7.2  --  6.5 7.1  NEUTROABS 3.6  --  5.4  --   HGB 12.5 13.3 12.4 12.4  HCT 38.1 39.0 37.5 38.1  MCV 92.9  --  91.0 93.4  PLT 276  --  271 277   Cardiac Enzymes: No results found for this basename: CKTOTAL, CKMB,  CKMBINDEX, TROPONINI,  in the last 168 hours BNP: No components found with this basename: POCBNP,  CBG: No results found for this basename: GLUCAP,  in the last 168 hours  No results found for this or any previous visit (from the past 240 hour(s)).   Scheduled Meds: . budesonide (PULMICORT) nebulizer solution  0.25 mg Nebulization BID  . cholecalciferol  15,000 Units Oral Daily  . enoxaparin (LOVENOX) injection  40 mg Subcutaneous Q24H  . guaiFENesin  600 mg Oral BID  . ipratropium-albuterol  3 mL Nebulization Q4H WA  . levofloxacin (LEVAQUIN) IV  750 mg Intravenous Q24H  . methylPREDNISolone (SOLU-MEDROL) injection  80 mg Intravenous Q8H  . PARoxetine  40 mg Oral Daily  . sodium chloride  3 mL Intravenous Q12H  . zinc sulfate  220 mg Oral Daily   Continuous Infusions:    Pernell Dikes, DO  [facility] Pager [phone]  If 7PM-7AM, please contact night-coverage [email or web address] Password [email or web address] 09/03/2014, 4:15 PM   LOS: 2 days

## 2014-09-04 MED ORDER — SODIUM CHLORIDE 0.9 % IV SOLN
INTRAVENOUS | Status: DC
  Administered 2014-09-04: 1000 mL via INTRAVENOUS

## 2014-09-04 MED ORDER — PREDNISONE 50 MG PO TABS
60.0000 mg | ORAL_TABLET | Freq: Every day | ORAL | Status: DC
  Administered 2014-09-05: 60 mg via ORAL
  Filled 2014-09-04 (×2): qty 1

## 2014-09-04 MED ORDER — HYDROCODONE-ACETAMINOPHEN 5-325 MG PO TABS
1.0000 | ORAL_TABLET | Freq: Four times a day (QID) | ORAL | Status: DC | PRN
  Administered 2014-09-04 – 2014-09-05 (×3): 1 via ORAL
  Filled 2014-09-04 (×3): qty 1

## 2014-09-04 MED ORDER — IPRATROPIUM-ALBUTEROL 0.5-2.5 (3) MG/3ML IN SOLN
3.0000 mL | Freq: Four times a day (QID) | RESPIRATORY_TRACT | Status: DC
  Administered 2014-09-04 – 2014-09-05 (×5): 3 mL via RESPIRATORY_TRACT
  Filled 2014-09-04 (×5): qty 3

## 2014-09-04 NOTE — Progress Notes (Signed)
PROGRESS NOTE  Lori Owens JAS:[id number] DOB: [birth year] DOA: 09/01/2014 PCP: Chari Manning, NP  Assessment/Plan: Acute hypoxemic respiratory failure  -Secondary to COPD exacerbation  -Presently stable on room air, but was hypoxemic upon arrival to emergency department 86% on room air  COPD exacerbation/Bronchitis  -Patient was hypoxic upon arrival to emergency department 86% on room air  -Possibly secondary to multifactorial causes including nicotine abuse, line of work (cleaning service)  -Chest x-ray: Mild peribronchial thickening, may reflect acute and/or chronic bronchitis.  -Continue nebulizers, antibiotics  -09/03/14--restart IV steroids-->significant improvement -09/04/14--wean to oral prednisone  -Patient counseled on smoking cessation  -Currently afebrile with no leukocytosis  -added mucinex  Tachycardia -EKG -orthostatic vital signs -TSH -IVF  Lower extremity swelling  -R>L-->improved  -Patient had right lower extremity Doppler on 08/08/2014: Negative for DVT  -echocardiogram--EF 41-93%, grade 1 diastolic dysfunction, no WMA  -Possibly has undiagnosed sleep apnea (per husband, patient snores like "freight train"  -Urged patient to have sleep study conducted once discharged  Tobacco abuse  -Counseled patient on smoking cessation.  Hypokalemia  -repleted  -check magnesium level--2.0     Family Communication:   Pt at beside Disposition Plan:   Home when medically stable       Procedures/Studies: Dg Chest 2 View  09/02/2014   CLINICAL DATA:  Cough, shortness of breath, COPD.  EXAM: CHEST  2 VIEW  COMPARISON:  11/05/2013  FINDINGS: Mild interstitial coarsening/peribronchial thickening. No confluent airspace opacity, pleural effusion, or pneumothorax. Cardiomediastinal contours within normal range. No acute osseous finding.  IMPRESSION: Mild peribronchial thickening, may reflect acute and/or chronic bronchitis.   Electronically Signed   By: Carlos Levering M.D.   On: 09/02/2014 01:24         Subjective: Patient is breathing significantly better when compared to yesterday. Denies any fevers, chills, chest pain, nausea, vomiting, diarrhea, abdominal pain. No dysuria or hematuria.  Objective: Filed Vitals:   09/04/14 0734 09/04/14 0744 09/04/14 1410 09/04/14 1450  BP:   156/93 144/95  Pulse:   130 114  Temp:    98.2 F (36.8 C)  TempSrc:    Oral  Resp:    20  Height:      Weight:      SpO2: 93% 92%  94%    Intake/Output Summary (Last 24 hours) at 09/04/14 1606 Last data filed at 09/04/14 1300  Gross per 24 hour  Intake    720 ml  Output      0 ml  Net    720 ml   Weight change:  Exam:   General:  Pt is alert, follows commands appropriately, not in acute distress  HEENT: No icterus, No thrush,[place]/AT  Cardiovascular: RRR, S1/S2, no rubs, no gallops  Respiratory: Minimal basilar wheezing. Good air movement. Bibasilar rales.  Abdomen: Soft/+BS, non tender, non distended, no guarding  Extremities: No edema, No lymphangitis, No petechiae, No rashes, no synovitis  Data Reviewed: Basic Metabolic Panel:  Recent Labs Lab 09/02/14 0022 09/02/14 0320 09/03/14 0451  NA 142 141 139  K 3.6* 3.5* 4.3  CL 105 103 103  CO2  --  23 25  GLUCOSE 114* 145* 110*  BUN 15 16 15   CREATININE 0.90 0.70 0.65  CALCIUM  --  9.0 9.3  MG  --   --  2.0   Liver Function Tests:  Recent Labs Lab 09/02/14 0320  AST 24  ALT 25  ALKPHOS 94  BILITOT <0.2*  PROT 6.7  ALBUMIN 3.6   No results found for this basename: LIPASE, AMYLASE,  in the last 168 hours No results found for this basename: AMMONIA,  in the last 168 hours CBC:  Recent Labs Lab 09/01/14 2355 09/02/14 0022 09/02/14 0320 09/03/14 0451  WBC 7.2  --  6.5 7.1  NEUTROABS 3.6  --  5.4  --   HGB 12.5 13.3 12.4 12.4  HCT 38.1 39.0 37.5 38.1  MCV 92.9  --  91.0 93.4  PLT 276  --  271 277   Cardiac Enzymes: No results found for this basename: CKTOTAL,  CKMB, CKMBINDEX, TROPONINI,  in the last 168 hours BNP: No components found with this basename: POCBNP,  CBG: No results found for this basename: GLUCAP,  in the last 168 hours  No results found for this or any previous visit (from the past 240 hour(s)).   Scheduled Meds: . budesonide (PULMICORT) nebulizer solution  0.25 mg Nebulization BID  . cholecalciferol  15,000 Units Oral Daily  . enoxaparin (LOVENOX) injection  40 mg Subcutaneous Q24H  . guaiFENesin  600 mg Oral BID  . ipratropium-albuterol  3 mL Nebulization QID  . levofloxacin (LEVAQUIN) IV  750 mg Intravenous Q24H  . methylPREDNISolone (SOLU-MEDROL) injection  80 mg Intravenous Q8H  . PARoxetine  40 mg Oral Daily  . sodium chloride  3 mL Intravenous Q12H  . zinc sulfate  220 mg Oral Daily   Continuous Infusions:    Sophya Vanblarcom, DO  [facility] Pager [phone]  If 7PM-7AM, please contact night-coverage [email or web address] Password [email or web address] 09/04/2014, 4:06 PM   LOS: 3 days

## 2014-09-04 NOTE — Progress Notes (Signed)
Clinical Social Work Department BRIEF PSYCHOSOCIAL ASSESSMENT 09/04/2014  Patient:  HORACE, WISHON     Account Number:  [id number]     Admit date:  09/01/2014  Clinical Social Worker:  Earlie Server  Date/Time:  09/04/2014 02:50 PM  Referred by:  RN  Date Referred:  09/04/2014 Referred for  Psychosocial assessment   Other Referral:   Interview type:  Patient Other interview type:    PSYCHOSOCIAL DATA Living Status:  FAMILY Admitted from facility:   Level of care:   Primary support name:  Arbie Cookey Primary support relationship to patient:  PARENT Degree of support available:   Strong    CURRENT CONCERNS Current Concerns  Other - See comment   Other Concerns:   Disability information    SOCIAL WORK ASSESSMENT / PLAN Patient was walking in the hallway and asked to speak with CSW. CSW reviewed chart and met with patient and family at bedside. CSW introduced myself and explained role.    Patient reports she is comfortable with family remaining in room and reports that she was recently diagnosed with a heart problem. Patient reports that she has been working but over the past 2 years has found that she had a harder time working and has been fired for not completing her tasks. 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 Patient asked CSW about how to apply for disability since she does not have coverage through an employer.    CSW provided patient with information to the [place] and encouraged her to follow up with agency re: completing application. Patient thanked CSW for information and reports no further concerns. CSW is signing off but available if needed.   Assessment/plan status:  Referral to [employer] Other assessment/ plan:   Information/referral to community resources:   Engineer, mining    PATIENT'S/FAMILY'S RESPONSE TO PLAN OF CARE: Patient alert and  oriented and engaged in assessment. Patient reports she likes working and wants to know her options about partial disability. Patient aware that Social Security will need to answer these questions for her and reports she will follow up once she is feeling better. Patient reports supportive family can assist in her in the meantime.       Lena, [place] [phone]

## 2014-09-05 LAB — CBC
HCT: 42.1 % (ref 36.0–46.0)
Hemoglobin: 13.8 g/dL (ref 12.0–15.0)
MCH: 30.2 pg (ref 26.0–34.0)
MCHC: 32.8 g/dL (ref 30.0–36.0)
MCV: 92.1 fL (ref 78.0–100.0)
PLATELETS: 382 10*3/uL (ref 150–400)
RBC: 4.57 MIL/uL (ref 3.87–5.11)
RDW: 13.1 % (ref 11.5–15.5)
WBC: 16.4 10*3/uL — ABNORMAL HIGH (ref 4.0–10.5)

## 2014-09-05 LAB — BASIC METABOLIC PANEL
Anion gap: 16 — ABNORMAL HIGH (ref 5–15)
BUN: 20 mg/dL (ref 6–23)
CALCIUM: 9.6 mg/dL (ref 8.4–10.5)
CO2: 23 mEq/L (ref 19–32)
Chloride: 98 mEq/L (ref 96–112)
Creatinine, Ser: 0.6 mg/dL (ref 0.50–1.10)
GFR calc Af Amer: >90 mL/min (ref >90)
GFR calc non Af Amer: >90 mL/min (ref >90)
Glucose, Bld: 158 mg/dL — ABNORMAL HIGH (ref 70–99)
Potassium: 4.7 mEq/L (ref 3.7–5.3)
Sodium: 137 mEq/L (ref 137–147)

## 2014-09-05 LAB — TSH: TSH: 0.59 u[IU]/mL (ref 0.350–4.500)

## 2014-09-05 MED ORDER — ALBUTEROL SULFATE HFA 108 (90 BASE) MCG/ACT IN AERS: 2.0000 | INHALATION_SPRAY | Freq: Four times a day (QID) | RESPIRATORY_TRACT | Status: DC | PRN

## 2014-09-05 MED ORDER — UNABLE TO FIND: Status: DC

## 2014-09-05 MED ORDER — METOPROLOL TARTRATE 25 MG PO TABS
25.0000 mg | ORAL_TABLET | Freq: Two times a day (BID) | ORAL | Status: DC
  Administered 2014-09-05: 25 mg via ORAL
  Filled 2014-09-05 (×2): qty 1

## 2014-09-05 MED ORDER — PREDNISONE 10 MG PO TABS: 60.0000 mg | ORAL_TABLET | Freq: Every day | ORAL | Status: DC

## 2014-09-05 MED ORDER — METOPROLOL TARTRATE 25 MG PO TABS: 25.0000 mg | ORAL_TABLET | Freq: Two times a day (BID) | ORAL | Status: DC

## 2014-09-05 NOTE — Discharge Summary (Signed)
Physician Discharge Summary  Lori Owens YFV:[id number] DOB: December 19, 1960 DOA: 09/01/2014  PCP: Chari Manning, NP  Admit date: 09/01/2014 Discharge date: 09/05/2014  Recommendations for Outpatient Follow-up:  1. Pt will need to follow up with PCP in 2 weeks post discharge 2. Please obtain BMP    Discharge Diagnoses:  Acute hypoxemic respiratory failure  -Secondary to COPD exacerbation  -Presently stable on room air, but was hypoxemic upon arrival to emergency department 86% on room air  COPD exacerbation/Bronchitis  -Patient was hypoxic upon arrival to emergency department 86% on room air  -Possibly secondary to multifactorial causes including nicotine abuse, line of work (cleaning service)  -Chest x-ray: Mild peribronchial thickening, may reflect acute and/or chronic bronchitis.  -Continue nebulizers -09/03/14--restart IV steroids-->significant improvement  -09/04/14--wean to oral prednisone  -Patient will go home with prednisone taper, 60 mg daily to decrease by 10 mg daily -Patient counseled on smoking cessation  -Currently afebrile with no leukocytosis  -added mucinex  -Prescription for albuterol MDI was given for discharge -The patient will continue on her long acting beta agonist bid at home -Patient finished 5 days of antibiotics during the hospitalization Tachycardia  -EKG--sinus tachycardia  -orthostatic vital signs--negative -TSH--0.590  -IVF--> resulting in improvement in the patient's tachycardia and to the upper 90s  Hypertension  -Patient was started on metoprolol tartrate 25 mg twice a day  Lower extremity swelling  -R>L-->improved  -Patient had right lower extremity Doppler on 08/08/2014: Negative for DVT  -echocardiogram--EF 16-38%, grade 1 diastolic dysfunction, no WMA  -Possibly has undiagnosed sleep apnea (per husband, patient snores like "freight train"  -Urged patient to have sleep study conducted once discharged  Tobacco abuse  -Counseled patient  on smoking cessation.  Hypokalemia  -repleted  -check magnesium level--2.0   Discharge Condition: Stable  Disposition: Home  Diet: Heart healthy  Wt Readings from Last 3 Encounters:  09/02/14 94.802 kg (209 lb)  06/06/14 95.255 kg (210 lb)  11/05/13 94.802 kg (209 lb)    History of present illness:   53 y.o. female with history of COPD and depression and ongoing tobacco abuse presents to the ER because of worsening shortness of breath. Patient states she has been having wheezing and productive cough for 10 days and had come to the ER last week and was prescribed steroids and despite taking which patient is still short of breath. In the ER, patient was found to be easily tachycardic and hypoxic on exertion. Patient has been admitted for further management for COPD exacerbation.Patient denies any chest pain fever chills nausea vomiting abdominal pain or diarrhea.  The patient was started on intravenous Solu-Medrol as well as antibiotics in the form of levofloxacin. The patient had good clinical improvement. The patient was weaned off of supplemental oxygen. Ambulatory pulse oximetry did not show any desaturation. The patient will be sent home with a prednisone taper as well as an albuterol inhaler. She will continue on her long acting beta agonist.    Discharge Exam: Filed Vitals:   09/05/14 1154  BP: 162/108  Pulse: 99  Temp: 98.2 F (36.8 C)  Resp: 16   Filed Vitals:   09/05/14 0739 09/05/14 0900 09/05/14 1100 09/05/14 1154  BP: 152/92 171/11 162/102 162/108  Pulse: 96 99  99  Temp: 98.3 F (36.8 C) 98.5 F (36.9 C)  98.2 F (36.8 C)  TempSrc: Oral Oral  Oral  Resp: 18 16  16   Height:      Weight:      SpO2: 95% 95%  94%   General: A&O x 3, NAD, pleasant, cooperative Cardiovascular: RRR, no rub, no gallop, no S3 Respiratory: Scattered bibasilar rales. No wheezing. Good air movement.  Abdomen:soft, nontender, nondistended, positive bowel sounds Extremities: No edema,  No lymphangitis, no petechiae  Discharge Instructions     Medication List         albuterol (5 MG/ML) 0.5% nebulizer solution  Commonly known as:  PROVENTIL  Take 2.5 mg by nebulization every 6 (six) hours as needed for wheezing or shortness of breath (COPD).     albuterol 108 (90 BASE) MCG/ACT inhaler  Commonly known as:  PROVENTIL HFA;VENTOLIN HFA  Inhale 2 puffs into the lungs every 6 (six) hours as needed for wheezing or shortness of breath.     BIOTIN PO  Take 1,000 mg by mouth daily.     cholecalciferol 1000 UNITS tablet  Commonly known as:  VITAMIN D  Take 15,000 Units by mouth daily.     IRON PO  Take 1,000 mg by mouth daily.     metoprolol tartrate 25 MG tablet  Commonly known as:  LOPRESSOR  Take 1 tablet (25 mg total) by mouth 2 (two) times daily.     mometasone-formoterol 100-5 MCG/ACT Aero  Commonly known as:  DULERA  Inhale 2 puffs into the lungs 2 (two) times daily.     PARoxetine 40 MG tablet  Commonly known as:  PAXIL  Take 40 mg by mouth every morning.     predniSONE 10 MG tablet  Commonly known as:  DELTASONE  Take 6 tablets (60 mg total) by mouth daily with breakfast. Start 09/06/14 and decrease by one tablet daily     PROBIOTIC PO  Take 50,000 Units by mouth daily.     Vitamin D (Ergocalciferol) 50000 UNITS Caps capsule  Commonly known as:  DRISDOL  Take 150,000 Units by mouth daily.     Zinc 50 MG Tabs  Take 1 tablet by mouth daily.         The results of significant diagnostics from this hospitalization (including imaging, microbiology, ancillary and laboratory) are listed below for reference.    Significant Diagnostic Studies: Dg Chest 2 View  09/02/2014   CLINICAL DATA:  Cough, shortness of breath, COPD.  EXAM: CHEST  2 VIEW  COMPARISON:  11/05/2013  FINDINGS: Mild interstitial coarsening/peribronchial thickening. No confluent airspace opacity, pleural effusion, or pneumothorax. Cardiomediastinal contours within normal range. No  acute osseous finding.  IMPRESSION: Mild peribronchial thickening, may reflect acute and/or chronic bronchitis.   Electronically Signed   By: Carlos Levering M.D.   On: 09/02/2014 01:24     Microbiology: No results found for this or any previous visit (from the past 240 hour(s)).   Labs: Basic Metabolic Panel:  Recent Labs Lab 09/02/14 0022 09/02/14 0320 09/03/14 0451 09/05/14 0450  NA 142 141 139 137  K 3.6* 3.5* 4.3 4.7  CL 105 103 103 98  CO2  --  23 25 23   GLUCOSE 114* 145* 110* 158*  BUN 15 16 15 20   CREATININE 0.90 0.70 0.65 0.60  CALCIUM  --  9.0 9.3 9.6  MG  --   --  2.0  --    Liver Function Tests:  Recent Labs Lab 09/02/14 0320  AST 24  ALT 25  ALKPHOS 94  BILITOT <0.2*  PROT 6.7  ALBUMIN 3.6   No results found for this basename: LIPASE, AMYLASE,  in the last 168 hours No results found for this basename: AMMONIA,  in the last 168 hours CBC:  Recent Labs Lab 09/01/14 2355 09/02/14 0022 09/02/14 0320 09/03/14 0451 09/05/14 0450  WBC 7.2  --  6.5 7.1 16.4*  NEUTROABS 3.6  --  5.4  --   --   HGB 12.5 13.3 12.4 12.4 13.8  HCT 38.1 39.0 37.5 38.1 42.1  MCV 92.9  --  91.0 93.4 92.1  PLT 276  --  271 277 382   Cardiac Enzymes: No results found for this basename: CKTOTAL, CKMB, CKMBINDEX, TROPONINI,  in the last 168 hours BNP: No components found with this basename: POCBNP,  CBG: No results found for this basename: GLUCAP,  in the last 168 hours  Time coordinating discharge:  Greater than 30 minutes  Signed:  Collette Pescador, DO [facility] Pager: [phone] 09/05/2014, 12:00 PM

## 2014-09-05 NOTE — Progress Notes (Signed)
Notified MD elevated BP 171/111. No new orders written. Continue to monitor.

## 2014-09-12 ENCOUNTER — Other Ambulatory Visit: Payer: Self-pay | Admitting: Internal Medicine

## 2014-09-16 ENCOUNTER — Other Ambulatory Visit: Payer: Self-pay | Admitting: Internal Medicine

## 2014-09-19 ENCOUNTER — Ambulatory Visit (HOSPITAL_BASED_OUTPATIENT_CLINIC_OR_DEPARTMENT_OTHER): Payer: 59 | Admitting: *Deleted

## 2014-09-19 ENCOUNTER — Encounter: Payer: Self-pay | Admitting: Internal Medicine

## 2014-09-19 ENCOUNTER — Ambulatory Visit: Payer: 59 | Attending: Internal Medicine | Admitting: Internal Medicine

## 2014-09-19 VITALS — BP 109/78 | HR 99 | Temp 98.9°F | Resp 16 | Ht 66.0 in | Wt 208.0 lb

## 2014-09-19 DIAGNOSIS — I10 Essential (primary) hypertension: Secondary | ICD-10-CM

## 2014-09-19 DIAGNOSIS — G4700 Insomnia, unspecified: Secondary | ICD-10-CM

## 2014-09-19 DIAGNOSIS — F17200 Nicotine dependence, unspecified, uncomplicated: Secondary | ICD-10-CM

## 2014-09-19 DIAGNOSIS — J441 Chronic obstructive pulmonary disease with (acute) exacerbation: Secondary | ICD-10-CM

## 2014-09-19 DIAGNOSIS — Z23 Encounter for immunization: Secondary | ICD-10-CM

## 2014-09-19 DIAGNOSIS — I429 Cardiomyopathy, unspecified: Secondary | ICD-10-CM | POA: Insufficient documentation

## 2014-09-19 DIAGNOSIS — F329 Major depressive disorder, single episode, unspecified: Secondary | ICD-10-CM | POA: Insufficient documentation

## 2014-09-19 DIAGNOSIS — Z720 Tobacco use: Secondary | ICD-10-CM

## 2014-09-19 DIAGNOSIS — F17210 Nicotine dependence, cigarettes, uncomplicated: Secondary | ICD-10-CM | POA: Insufficient documentation

## 2014-09-19 MED ORDER — VARENICLINE TARTRATE 0.5 MG X 11 & 1 MG X 42 PO MISC: ORAL | Status: DC

## 2014-09-19 MED ORDER — FLUTICASONE-SALMETEROL 250-50 MCG/DOSE IN AEPB: 1.0000 | INHALATION_SPRAY | Freq: Two times a day (BID) | RESPIRATORY_TRACT | Status: DC

## 2014-09-19 MED ORDER — METOPROLOL TARTRATE 25 MG PO TABS: 25.0000 mg | ORAL_TABLET | Freq: Two times a day (BID) | ORAL | Status: DC

## 2014-09-19 MED ORDER — ALBUTEROL SULFATE (5 MG/ML) 0.5% IN NEBU: 2.5000 mg | INHALATION_SOLUTION | Freq: Four times a day (QID) | RESPIRATORY_TRACT | Status: DC | PRN

## 2014-09-19 NOTE — Progress Notes (Signed)
Patient ID: Bruce Mayers, female   DOB: [birth year], 53 y.o.   MRN: [id number]  CC: COPD, HTN, Cardiomyopathy  HPI:  Patient reports that she was discharged from the hospital two weeks ago from COPD exacerbation and cardiomyopathy.  She had BLE edema and was found to be negative for a DVT and a CHF.  She reports that she continues to smoke daily. She used Chantix in the past and was successful.  She reports that she would like to start back on the Chantix to help with smoking cessation.  She also c/o of insomnia.  She currently sleeps only four hours per night.  She has used hydroxyzine in the past for sleep without improvement.  She is currently taking 4 tylenol PM at night and still has some difficulty.    No Known Allergies Past Medical History  Diagnosis Date  . Depression 01/18/2013  . Tobacco abuse 01/18/2013  . Headache(784.0)   . COPD (chronic obstructive pulmonary disease)   . Heart murmur   . Cancer    Current Outpatient Prescriptions on File Prior to Visit  Medication Sig Dispense Refill  . albuterol (PROVENTIL HFA;VENTOLIN HFA) 108 (90 BASE) MCG/ACT inhaler Inhale 2 puffs into the lungs every 6 (six) hours as needed for wheezing or shortness of breath. 6.7 g 1  . albuterol (PROVENTIL) (5 MG/ML) 0.5% nebulizer solution Take 2.5 mg by nebulization every 6 (six) hours as needed for wheezing or shortness of breath (COPD).    Marland Kitchen BIOTIN PO Take 1,000 mg by mouth daily.    . cholecalciferol (VITAMIN D) 1000 UNITS tablet Take 15,000 Units by mouth daily.    . IRON PO Take 1,000 mg by mouth daily.    . metoprolol tartrate (LOPRESSOR) 25 MG tablet Take 1 tablet (25 mg total) by mouth 2 (two) times daily. 60 tablet 1  . PARoxetine (PAXIL) 40 MG tablet Take 40 mg by mouth every morning.    . Probiotic Product (PROBIOTIC PO) Take 50,000 Units by mouth daily.    . Zinc 50 MG TABS Take 1 tablet by mouth daily.    . mometasone-formoterol (DULERA) 100-5 MCG/ACT AERO Inhale 2 puffs into the lungs 2  (two) times daily.    . predniSONE (DELTASONE) 10 MG tablet Take 6 tablets (60 mg total) by mouth daily with breakfast. Start 09/06/14 and decrease by one tablet daily 21 tablet 0  . UNABLE TO FIND Ms. Fauble was admitted to the hospital from 09/01/14 through 09/05/14.  She is medically stable to return to work. 1 Act 0  . Vitamin D, Ergocalciferol, (DRISDOL) 50000 UNITS CAPS capsule Take 150,000 Units by mouth daily.     No current facility-administered medications on file prior to visit.   Family History  Problem Relation Age of Onset  . Diabetes Maternal Grandfather   . Heart disease Maternal Grandfather   . Cancer Maternal Grandfather   . Hypertension Maternal Grandfather    History   Social History  . Marital Status: Single    Spouse Name: N/A    Number of Children: N/A  . Years of Education: N/A   Occupational History  . Not on file.   Social History Main Topics  . Smoking status: Current Every Day Smoker -- 0.50 packs/day    Last Attempt to Quit: 01/18/2013  . Smokeless tobacco: Never Used  . Alcohol Use: No  . Drug Use: No  . Sexual Activity: No   Other Topics Concern  . Not on file   Social  History Narrative   ** Merged History Encounter **        Review of Systems  Constitutional: Positive for malaise/fatigue.  Respiratory: Positive for shortness of breath. Negative for cough.   Cardiovascular: Positive for chest pain, palpitations and leg swelling (resolved today).  Neurological: Negative for dizziness.     Objective:   Filed Vitals:   09/19/14 1601  BP: 109/78  Pulse: 99  Temp: 98.9 F (37.2 C)  Resp: 16    Physical Exam  Constitutional: She is oriented to person, place, and time.  HENT:  Right Ear: External ear normal.  Left Ear: External ear normal.  Mouth/Throat: Oropharynx is clear and moist.  Neck: No JVD present.  Cardiovascular: Normal rate, regular rhythm and normal heart sounds.   No murmur heard. Pulmonary/Chest: Effort normal  and breath sounds normal. She has no wheezes.  Abdominal: Soft. Bowel sounds are normal.  Musculoskeletal: Normal range of motion. She exhibits edema.  Neurological: She is alert and oriented to person, place, and time. She has normal reflexes.     Lab Results  Component Value Date   WBC 16.4* 09/05/2014   HGB 13.8 09/05/2014   HCT 42.1 09/05/2014   MCV 92.1 09/05/2014   PLT 382 09/05/2014   Lab Results  Component Value Date   CREATININE 0.60 09/05/2014   BUN 20 09/05/2014   NA 137 09/05/2014   K 4.7 09/05/2014   CL 98 09/05/2014   CO2 23 09/05/2014    Lab Results  Component Value Date   HGBA1C 5.6 06/06/2014   Lipid Panel  No results found for: CHOL, TRIG, HDL, CHOLHDL, VLDL, LDLCALC     Assessment and plan:   Johniya was seen today for follow-up.  Diagnoses and associated orders for this visit:  COPD exacerbation - albuterol (PROVENTIL) (5 MG/ML) 0.5% nebulizer solution; Take 0.5 mLs (2.5 mg total) by nebulization every 6 (six) hours as needed for wheezing or shortness of breath (COPD). - Fluticasone-Salmeterol (ADVAIR DISKUS) 250-50 MCG/DOSE AEPB; Inhale 1 puff into the lungs 2 (two) times daily. Patient given refills of inhalers. Explained that patient may use albuterol inhaler 2 puffs every 4-6 hours. If patient notices increased SOB without relief from rescue inhaler he/she will need to RTC or seek care at nearest ER Patient will apply for hospital discount and call back to get referral to pulmonology  Essential hypertension - metoprolol tartrate (LOPRESSOR) 25 MG tablet; Take 1 tablet (25 mg total) by mouth 2 (two) times daily. Patient blood pressure is stable and may continue on current medication.  Education on diet, exercise, and modifiable risk factors discussed. Will obtain appropriate labs as needed. Will follow up in 3-6 months.   Cardiomyopathy Explained the pathophysiology of cardiomyopathy and the need for strict BP control.    Insomnia Patient may  try OTC Melatonin and discussed sleep hygiene.  I do not feel comfortable prescribing patient trazodone due to the use of paxil and chantix use.    Smoker - varenicline (CHANTIX PAK) 0.5 MG X 11 & 1 MG X 42 tablet; Take one 0.5 mg tablet by mouth once daily for 3 days, then increase to one 0.5 mg tablet twice daily for 4 days, then increase to one 1 mg tablet twice daily. Smoking cessation instruction/counseling given:  counseled patient on the dangers of tobacco use, advised patient to stop smoking, and reviewed strategies to maximize success  Need for influenza vaccination Influenza injection received.  Explained side effects and contraindications to patient. Information  sheet given to patient.   Return in about 3 months (around 12/20/2014) for COPD, HTN.     Chari Manning, NP-C [facility] [phone] 09/19/2014, 4:41 PM

## 2014-09-19 NOTE — Progress Notes (Signed)
HFU Pt here to continue care for her COPD and cardiopathy. Pt states that she is still having chest pain. Pt is currently smoking.  Pt said that she knows something is going on with her heart because her right calf swells.

## 2014-09-19 NOTE — Patient Instructions (Addendum)
If you notice increased SOB or Edema may return to clinic    Cardiomyopathy Cardiomyopathy means a disease of the heart muscle. The heart muscle becomes enlarged or stiff. The heart is not able to pump enough blood or deliver enough oxygen to the body. This leads to heart failure and is the number one reason for heart transplants.  TYPES OF CARDIOMYOPATHY INCLUDE: DILATED  The most common type. The heart muscle is stretched out and weak so there is less blood pumped out.   Some causes:  Disease of the arteries of the heart (ischemia).  Heart attack with muscle scar.  Leaky or damaged valves.  After a viral illness.  Smoking.  High cholesterol.  Diabetes or overactive thyroid.  Alcohol or drug abuse.  High blood pressure.  May be reversible. HYPERTROPHIC The heart muscle grows bigger so there is less room for blood in the ventricle, and not enough blood is pumped out.   Causes include:  Mitral valve leaks.  Inherited tendency (from your family).  No explanation (idiopathic).  May be a cause of sudden death in young athletes with no symptoms. RESTRICTIVE The heart muscle becomes stiff, but not always larger. The heart has to work harder and will get weaker. Abnormal heart beats or rhythm (arrhythmia) are common.  Some causes:  Diseases in other parts of the body which may produce abnormal deposits in the heart muscle.  Probably not inherited.  A result of radiation treatment for cancer. SYMPTOMS OF ALL TYPES:  Less able to exercise or tolerate physical activity.  Palpitations.  Irregular heart beat, heart arrhythmias.  Shortness of breath, even at rest.  Chest pain.  Lightheadedness or fainting. TREATMENT  Life-style changes including reducing salt, lowering cholesterol, stop smoking.  Manage contributing causes with medications.  Medicines to help reduce the fluids in the body.  An implanted cardioverter defibrillator (ICD) to improve heart  function and correct arrhythmias.  Medications to relax the blood vessels and make it easier for the heart to pump.  Drugs that help regulate heart beat and improve heart relaxation, reducing the work of the heart.  Myomectomy for patients with hypertrophic cardiomyopathy and severe problems. This is a surgical procedure that removes a portion of the thickened muscle wall in order to improve heart output and provide symptom relief.  A heart transplant is an option in carefully applied circumstances. SEEK IMMEDIATE MEDICAL CARE IF:   You have severe chest pain, especially if the pain is crushing or pressure-like and spreads to the arms, back, neck, or jaw, or if you have sweating, feeling sick to your stomach (nausea), or shortness of breath. THIS IS AN EMERGENCY. Do not wait to see if the pain will go away. Get medical help at once. Call your local emergency services (911 in [place] DO NOT drive yourself to the hospital.  You develop severe shortness of breath.  You begin to cough up bloody sputum.  You are unable to sleep because you cannot breathe.  You gain weight due to fluid retention.  You develop painful swelling in your calf or leg.  You feel your heart racing and it does not go away or happens when you are resting. Document Released: 01/07/2005 Document Revised: 01/17/2012 Document Reviewed: 06/12/2008 [facility] Patient Information 2015 [place], [place]. This information is not intended to replace advice given to you by your health care provider. Make sure you discuss any questions you have with your health care provider. Smoking Cessation Quitting smoking is important to your health  and has many advantages. However, it is not always easy to quit since nicotine is a very addictive drug. Oftentimes, people try 3 times or more before being able to quit. This document explains the best ways for you to prepare to quit smoking. Quitting takes hard work and a lot of effort, but you can do  it. ADVANTAGES OF QUITTING SMOKING  You will live longer, feel better, and live better.  Your body will feel the impact of quitting smoking almost immediately.  Within 20 minutes, blood pressure decreases. Your pulse returns to its normal level.  After 8 hours, carbon monoxide levels in the blood return to normal. Your oxygen level increases.  After 24 hours, the chance of having a heart attack starts to decrease. Your breath, hair, and body stop smelling like smoke.  After 48 hours, damaged nerve endings begin to recover. Your sense of taste and smell improve.  After 72 hours, the body is virtually free of nicotine. Your bronchial tubes relax and breathing becomes easier.  After 2 to 12 weeks, lungs can hold more air. Exercise becomes easier and circulation improves.  The risk of having a heart attack, stroke, cancer, or lung disease is greatly reduced.  After 1 year, the risk of coronary heart disease is cut in half.  After 5 years, the risk of stroke falls to the same as a nonsmoker.  After 10 years, the risk of lung cancer is cut in half and the risk of other cancers decreases significantly.  After 15 years, the risk of coronary heart disease drops, usually to the level of a nonsmoker.  If you are pregnant, quitting smoking will improve your chances of having a healthy baby.  The people you live with, especially any children, will be healthier.  You will have extra money to spend on things other than cigarettes. QUESTIONS TO THINK ABOUT BEFORE ATTEMPTING TO QUIT You may want to talk about your answers with your health care provider.  Why do you want to quit?  If you tried to quit in the past, what helped and what did not?  What will be the most difficult situations for you after you quit? How will you plan to handle them?  Who can help you through the tough times? Your family? Friends? A health care provider?  What pleasures do you get from smoking? What ways can you  still get pleasure if you quit? Here are some questions to ask your health care provider:  How can you help me to be successful at quitting?  What medicine do you think would be best for me and how should I take it?  What should I do if I need more help?  What is smoking withdrawal like? How can I get information on withdrawal? GET READY  Set a quit date.  Change your environment by getting rid of all cigarettes, ashtrays, matches, and lighters in your home, car, or work. Do not let people smoke in your home.  Review your past attempts to quit. Think about what worked and what did not. GET SUPPORT AND ENCOURAGEMENT You have a better chance of being successful if you have help. You can get support in many ways.  Tell your family, friends, and coworkers that you are going to quit and need their support. Ask them not to smoke around you.  Get individual, group, or telephone counseling and support. Programs are available at [employer] and health centers. Call your local health department for information about programs  in your area.  Spiritual beliefs and practices may help some smokers quit.  Download a "quit meter" on your computer to keep track of quit statistics, such as how long you have gone without smoking, cigarettes not smoked, and money saved.  Get a self-help book about quitting smoking and staying off tobacco. [place] yourself from urges to smoke. Talk to someone, go for a walk, or occupy your time with a task.  Change your normal routine. Take a different route to work. Drink tea instead of coffee. Eat breakfast in a different place.  Reduce your stress. Take a hot bath, exercise, or read a book.  Plan something enjoyable to do every day. Reward yourself for not smoking.  Explore interactive web-based programs that specialize in helping you quit. GET MEDICINE AND USE IT CORRECTLY Medicines can help you stop smoking and decrease  the urge to smoke. Combining medicine with the above behavioral methods and support can greatly increase your chances of successfully quitting smoking.  Nicotine replacement therapy helps deliver nicotine to your body without the negative effects and risks of smoking. Nicotine replacement therapy includes nicotine gum, lozenges, inhalers, nasal sprays, and skin patches. Some may be available over-the-counter and others require a prescription.  Antidepressant medicine helps people abstain from smoking, but how this works is unknown. This medicine is available by prescription.  Nicotinic receptor partial agonist medicine simulates the effect of nicotine in your brain. This medicine is available by prescription. Ask your health care provider for advice about which medicines to use and how to use them based on your health history. Your health care provider will tell you what side effects to look out for if you choose to be on a medicine or therapy. Carefully read the information on the package. Do not use any other product containing nicotine while using a nicotine replacement product.  RELAPSE OR DIFFICULT SITUATIONS Most relapses occur within the first 3 months after quitting. Do not be discouraged if you start smoking again. Remember, most people try several times before finally quitting. You may have symptoms of withdrawal because your body is used to nicotine. You may crave cigarettes, be irritable, feel very hungry, cough often, get headaches, or have difficulty concentrating. The withdrawal symptoms are only temporary. They are strongest when you first quit, but they will go away within 10-14 days. To reduce the chances of relapse, try to:  Avoid drinking alcohol. Drinking lowers your chances of successfully quitting.  Reduce the amount of caffeine you consume. Once you quit smoking, the amount of caffeine in your body increases and can give you symptoms, such as a rapid heartbeat, sweating, and  anxiety.  Avoid smokers because they can make you want to smoke.  Do not let weight gain distract you. Many smokers will gain weight when they quit, usually less than 10 pounds. Eat a healthy diet and stay active. You can always lose the weight gained after you quit.  Find ways to improve your mood other than smoking. FOR MORE INFORMATION  www.smokefree.gov  Document Released: 10/19/2001 Document Revised: 03/11/2014 Document Reviewed: 02/03/2012 [facility] Patient Information 2015 [place], [place]. This information is not intended to replace advice given to you by your health care provider. Make sure you discuss any questions you have with your health care provider.

## 2014-09-20 ENCOUNTER — Other Ambulatory Visit: Payer: Self-pay | Admitting: Internal Medicine

## 2014-09-23 ENCOUNTER — Encounter: Payer: Self-pay | Admitting: Internal Medicine

## 2014-10-02 ENCOUNTER — Telehealth: Payer: Self-pay | Admitting: Internal Medicine

## 2014-10-02 NOTE — Telephone Encounter (Signed)
Expand All Collapse All   Patient calling to follow up on referrals for pulmonary and cardiology. Please assist.

## 2014-10-02 NOTE — Telephone Encounter (Signed)
Patient calling to follow up on referrals for pulmonary and cardiology. Please assist.

## 2014-10-04 ENCOUNTER — Emergency Department (HOSPITAL_COMMUNITY): Payer: PRIVATE HEALTH INSURANCE

## 2014-10-04 ENCOUNTER — Emergency Department (HOSPITAL_COMMUNITY): Payer: Self-pay

## 2014-10-04 ENCOUNTER — Encounter (HOSPITAL_COMMUNITY): Payer: Self-pay

## 2014-10-04 ENCOUNTER — Emergency Department (HOSPITAL_COMMUNITY)
Admission: EM | Admit: 2014-10-04 | Discharge: 2014-10-05 | Disposition: A | Discharge status: Home / Self Care | Payer: PRIVATE HEALTH INSURANCE | Attending: Emergency Medicine | Admitting: Emergency Medicine

## 2014-10-04 DIAGNOSIS — Z7952 Long term (current) use of systemic steroids: Secondary | ICD-10-CM | POA: Insufficient documentation

## 2014-10-04 DIAGNOSIS — R079 Chest pain, unspecified: Secondary | ICD-10-CM | POA: Insufficient documentation

## 2014-10-04 DIAGNOSIS — R059 Cough, unspecified: Secondary | ICD-10-CM

## 2014-10-04 DIAGNOSIS — Z859 Personal history of malignant neoplasm, unspecified: Secondary | ICD-10-CM | POA: Insufficient documentation

## 2014-10-04 DIAGNOSIS — J441 Chronic obstructive pulmonary disease with (acute) exacerbation: Secondary | ICD-10-CM | POA: Insufficient documentation

## 2014-10-04 DIAGNOSIS — Z79899 Other long term (current) drug therapy: Secondary | ICD-10-CM | POA: Insufficient documentation

## 2014-10-04 DIAGNOSIS — Z720 Tobacco use: Secondary | ICD-10-CM | POA: Insufficient documentation

## 2014-10-04 DIAGNOSIS — R600 Localized edema: Secondary | ICD-10-CM | POA: Insufficient documentation

## 2014-10-04 DIAGNOSIS — R05 Cough: Secondary | ICD-10-CM | POA: Insufficient documentation

## 2014-10-04 DIAGNOSIS — R61 Generalized hyperhidrosis: Secondary | ICD-10-CM | POA: Insufficient documentation

## 2014-10-04 DIAGNOSIS — R011 Cardiac murmur, unspecified: Secondary | ICD-10-CM | POA: Insufficient documentation

## 2014-10-04 DIAGNOSIS — Z7951 Long term (current) use of inhaled steroids: Secondary | ICD-10-CM | POA: Insufficient documentation

## 2014-10-04 DIAGNOSIS — F329 Major depressive disorder, single episode, unspecified: Secondary | ICD-10-CM | POA: Insufficient documentation

## 2014-10-04 LAB — CBC WITH DIFFERENTIAL/PLATELET
BASOS ABS: 0 10*3/uL (ref 0.0–0.1)
Basophils Relative: 0 % (ref 0–1)
Eosinophils Absolute: 0.3 10*3/uL (ref 0.0–0.7)
Eosinophils Relative: 5 % (ref 0–5)
HCT: 40 % (ref 36.0–46.0)
Hemoglobin: 13.3 g/dL (ref 12.0–15.0)
Lymphocytes Relative: 40 % (ref 12–46)
Lymphs Abs: 2.2 10*3/uL (ref 0.7–4.0)
MCH: 31 pg (ref 26.0–34.0)
MCHC: 33.3 g/dL (ref 30.0–36.0)
MCV: 93.2 fL (ref 78.0–100.0)
Monocytes Absolute: 0.4 10*3/uL (ref 0.1–1.0)
Monocytes Relative: 8 % (ref 3–12)
NEUTROS ABS: 2.6 10*3/uL (ref 1.7–7.7)
NEUTROS PCT: 47 % (ref 43–77)
Platelets: 289 10*3/uL (ref 150–400)
RBC: 4.29 MIL/uL (ref 3.87–5.11)
RDW: 12.9 % (ref 11.5–15.5)
WBC: 5.5 10*3/uL (ref 4.0–10.5)

## 2014-10-04 LAB — D-DIMER, QUANTITATIVE (NOT AT ARMC): D-Dimer, Quant: 0.55 ug/mL-FEU — ABNORMAL HIGH (ref 0.00–0.48)

## 2014-10-04 LAB — BASIC METABOLIC PANEL
Anion gap: 12 (ref 5–15)
BUN: 15 mg/dL (ref 6–23)
CO2: 25 mEq/L (ref 19–32)
Calcium: 9.9 mg/dL (ref 8.4–10.5)
Chloride: 104 mEq/L (ref 96–112)
Creatinine, Ser: 0.75 mg/dL (ref 0.50–1.10)
GFR calc Af Amer: >90 mL/min (ref >90)
Glucose, Bld: 98 mg/dL (ref 70–99)
POTASSIUM: 4.7 meq/L (ref 3.7–5.3)
SODIUM: 141 meq/L (ref 137–147)

## 2014-10-04 LAB — I-STAT TROPONIN, ED: TROPONIN I, POC: 0 ng/mL (ref 0.00–0.08)

## 2014-10-04 MED ORDER — IOHEXOL 350 MG/ML SOLN
100.0000 mL | Freq: Once | INTRAVENOUS | Status: AC | PRN
  Administered 2014-10-04: 100 mL via INTRAVENOUS

## 2014-10-04 NOTE — ED Notes (Signed)
Pt states increased weakness, fatigue for last 2 days.  Pt having swelling in right leg with tightness.  Pt also having slight shortness of breath although sats stable in triage with no airway difficulty noted.

## 2014-10-04 NOTE — ED Notes (Signed)
Patient transported to CT 

## 2014-10-04 NOTE — ED Notes (Signed)
Patient transported to X-ray 

## 2014-10-04 NOTE — ED Provider Notes (Signed)
CSN: [id number]     Arrival date & time 10/04/14  1756 History   First MD Initiated Contact with Patient 10/04/14 2039     Chief Complaint  Patient presents with  . Leg Swelling  . Weakness     Patient is a 53 y.o. female presenting with weakness.  Weakness Associated symptoms include shortness of breath.  Pt presents to the emergency department for evaluation of cough and leg swelling. She reports that she was admitted to the hospital about 3 weeks ago for leg swelling and shortness of breath and diagnosed with cardiomyopathy and COPD exacerbation. She is feeling better after leaving the hospital but states that for the last 2 days she has had increased swelling in her lower extremities as well as increased shortness of breath with cough productive of clear phlegm. She states the cough and shortness of breath feel different than her COPD and she has not had her nebulizers at home. She denies any fevers. She does have some chest pain with coughing. She is urinating without difficulty and has no abdominal pain or vomiting. She was not started on any diuretic in the hospital and currently is taking Lopressor twice a day for increased heart rate. She has not been able to schedule a cardiology follow-up.    Past Medical History  Diagnosis Date  . Depression 01/18/2013  . Tobacco abuse 01/18/2013  . Headache(784.0)   . COPD (chronic obstructive pulmonary disease)   . Heart murmur   . Cancer    Past Surgical History  Procedure Laterality Date  . Throat surgery    . Abdominal hysterectomy    . Nose surgery      polyp removal from nasal cavity   . Tonsillectomy     Family History  Problem Relation Age of Onset  . Diabetes Maternal Grandfather   . Heart disease Maternal Grandfather   . Cancer Maternal Grandfather   . Hypertension Maternal Grandfather    History  Substance Use Topics  . Smoking status: Current Every Day Smoker -- 0.50 packs/day    Last Attempt to Quit: 01/18/2013  .  Smokeless tobacco: Never Used  . Alcohol Use: No   OB History    No data available     Review of Systems  Constitutional: Positive for diaphoresis and fatigue. Negative for fever.  Respiratory: Positive for cough and shortness of breath.   Cardiovascular:       Chest pain with cough  Neurological: Positive for weakness.  All other systems reviewed and are negative.     Allergies  Review of patient's allergies indicates no known allergies.  Home Medications   Prior to Admission medications   Medication Sig Start Date End Date Taking? Authorizing Provider  albuterol (PROVENTIL HFA;VENTOLIN HFA) 108 (90 BASE) MCG/ACT inhaler Inhale 2 puffs into the lungs every 6 (six) hours as needed for wheezing or shortness of breath. 09/05/14  Yes Orson Eva, MD  albuterol (PROVENTIL) (5 MG/ML) 0.5% nebulizer solution Take 0.5 mLs (2.5 mg total) by nebulization every 6 (six) hours as needed for wheezing or shortness of breath (COPD). 09/19/14  Yes Lance Bosch, NP  cholecalciferol (VITAMIN D) 1000 UNITS tablet Take 15,000 Units by mouth daily.   Yes Historical Provider, MD  Fluticasone-Salmeterol (ADVAIR DISKUS) 250-50 MCG/DOSE AEPB Inhale 1 puff into the lungs 2 (two) times daily. 09/19/14  Yes Lance Bosch, NP  metoprolol tartrate (LOPRESSOR) 25 MG tablet Take 1 tablet (25 mg total) by mouth 2 (two) times daily.  09/19/14  Yes Lance Bosch, NP  PARoxetine (PAXIL) 40 MG tablet Take 40 mg by mouth every morning.   Yes Historical Provider, MD  Probiotic Product (PROBIOTIC PO) Take 50,000 Units by mouth daily.   Yes Historical Provider, MD  predniSONE (DELTASONE) 10 MG tablet Take 6 tablets (60 mg total) by mouth daily with breakfast. Start 09/06/14 and decrease by one tablet daily Patient not taking: Reported on 10/04/2014 09/05/14   Orson Eva, MD  varenicline (CHANTIX PAK) 0.5 MG X 11 & 1 MG X 42 tablet Take one 0.5 mg tablet by mouth once daily for 3 days, then increase to one 0.5 mg tablet  twice daily for 4 days, then increase to one 1 mg tablet twice daily. 09/19/14   Lance Bosch, NP   BP 114/78 mmHg  Pulse 69  Temp(Src) 97.7 F (36.5 C) (Oral)  Resp 16  SpO2 98% Physical Exam  Constitutional: She is oriented to person, place, and time. She appears well-developed and well-nourished.  HENT:  Head: Normocephalic and atraumatic.  Cardiovascular: Normal rate and regular rhythm.   No murmur heard. Pulmonary/Chest: Effort normal. No respiratory distress.  Occasional end expiratory wheeze  Abdominal: Soft. There is no tenderness. There is no rebound and no guarding.  Musculoskeletal: She exhibits no tenderness.  Trace pitting edema of BLE  Neurological: She is alert and oriented to person, place, and time.  Skin: Skin is warm and dry.  Psychiatric: She has a normal mood and affect. Her behavior is normal.  Nursing note and vitals reviewed.   ED Course  Procedures (including critical care time) Labs Review Labs Reviewed  D-DIMER, QUANTITATIVE - Abnormal; Notable for the following:    D-Dimer, Quant 0.55 (*)    All other components within normal limits  CBC WITH DIFFERENTIAL  BASIC METABOLIC PANEL  PRO B NATRIURETIC PEPTIDE  I-STAT TROPOININ, ED    Imaging Review Dg Chest 2 View  10/04/2014   CLINICAL DATA:  Cough, congestion, shortness of breath, leg swelling  EXAM: CHEST  2 VIEW  COMPARISON:  09/02/2014  FINDINGS: Chronic interstitial markings. No focal consolidation. No pleural effusion or pneumothorax.  The heart is normal in size.  Degenerative changes of the visualized thoracolumbar spine.  IMPRESSION: No evidence of acute cardiopulmonary disease.   Electronically Signed   By: Julian Hy M.D.   On: 10/04/2014 21:40   Ct Angio Chest Pe W/cm &/or Wo Cm  10/05/2014   CLINICAL DATA:  Subacute onset of worsening chest pain, shortness of breath, leg swelling and weakness for the past month. History of hypertension. Initial encounter.  EXAM: CT ANGIOGRAPHY  CHEST WITH CONTRAST  TECHNIQUE: Multidetector CT imaging of the chest was performed using the standard protocol during bolus administration of intravenous contrast. Multiplanar CT image reconstructions and MIPs were obtained to evaluate the vascular anatomy.  CONTRAST:  154mL OMNIPAQUE IOHEXOL 350 MG/ML SOLN  COMPARISON:  Chest radiograph performed earlier today at 9:20 p.m., and CT of the chest performed 08/27/2013  FINDINGS: There is no evidence of pulmonary embolus.  The lungs appear clear bilaterally. There is no evidence of significant focal consolidation, pleural effusion or pneumothorax. No masses are identified; no abnormal focal contrast enhancement is seen.  Borderline prominent periaortic nodes are seen, measuring up to 1.1 cm in short axis. No additional mediastinal lymphadenopathy is seen. No pericardial effusion is identified. The great vessels are grossly unremarkable in appearance. No axillary lymphadenopathy is seen. The visualized portions of the thyroid gland are  unremarkable in appearance.  The visualized portions of the liver and spleen are unremarkable.  No acute osseous abnormalities are seen.  Review of the MIP images confirms the above findings.  IMPRESSION: 1. No evidence of pulmonary embolus. 2. Lungs clear bilaterally. 3. Borderline prominent periaortic nodes, measuring up to 1.1 cm in short axis.   Electronically Signed   By: Garald Balding M.D.   On: 10/05/2014 00:05     EKG Interpretation None     Unable to update EKG in MUSE.  EKG NSR, rate of 76, normal axis.  No acute ST changes, normal intervals.  MDM   Final diagnoses:  Cough  Bilateral lower extremity edema    Patient here for evaluation of cough, dyspnea, bilateral lower extremity edema. In terms of cough and dyspnea, clinical picture not consistent with pneumonia, ACS, PE, congestive heart failure, COPD exacerbation. Patient with no respiratory distress in emergency department she has no oxygen requirement and  there is no tachypnea. Terms of extremity edema clinical picture not consistent with DVT, renal failure, cellulitis, heart failure. They show a history of edema in the past with no clear cause. Treating symptomatically edema with Lasix at home and potassium supplementation with close PCP follow-up. Return precautions were discussed  Quintella Reichert, MD 10/05/14 0104

## 2014-10-04 NOTE — ED Notes (Signed)
MD at bedside. 

## 2014-10-04 NOTE — ED Notes (Signed)
Bed: OX73 Expected date:  Expected time:  Means of arrival:  Comments: Hold for rm 42

## 2014-10-05 LAB — PRO B NATRIURETIC PEPTIDE: Pro B Natriuretic peptide (BNP): 105.1 pg/mL (ref 0–125)

## 2014-10-05 MED ORDER — POTASSIUM CHLORIDE ER 20 MEQ PO TBCR: 20.0000 meq | EXTENDED_RELEASE_TABLET | Freq: Every day | ORAL | Status: DC

## 2014-10-05 MED ORDER — FUROSEMIDE 20 MG PO TABS: 20.0000 mg | ORAL_TABLET | Freq: Every day | ORAL | Status: DC

## 2014-10-05 NOTE — Discharge Instructions (Signed)
Edema  Edema is an abnormal buildup of fluids. It is more common in your legs and thighs. Painless swelling of the feet and ankles is more likely as a person ages. It also is common in looser skin, like around your eyes.  HOME CARE   · Keep the affected body part above the level of the heart while lying down.  · Do not sit still or stand for a long time.  · Do not put anything right under your knees when you lie down.  · Do not wear tight clothes on your upper legs.  · Exercise your legs to help the puffiness (swelling) go down.  · Wear elastic bandages or support stockings as told by your doctor.  · A low-salt diet may help lessen the puffiness.  · Only take medicine as told by your doctor.  GET HELP IF:  · Treatment is not working.  · You have heart, liver, or kidney disease and notice that your skin looks puffy or shiny.  · You have puffiness in your legs that does not get better when you raise your legs.  · You have sudden weight gain for no reason.  GET HELP RIGHT AWAY IF:   · You have shortness of breath or chest pain.  · You cannot breathe when you lie down.  · You have pain, redness, or warmth in the areas that are puffy.  · You have heart, liver, or kidney disease and get edema all of a sudden.  · You have a fever and your symptoms get worse all of a sudden.  MAKE SURE YOU:   · Understand these instructions.  · Will watch your condition.  · Will get help right away if you are not doing well or get worse.  Document Released: 04/12/2008 Document Revised: 10/30/2013 Document Reviewed: 08/17/2013  [employer]® Patient Information ©2015 [employer]. This information is not intended to replace advice given to you by your health care provider. Make sure you discuss any questions you have with your health care provider.

## 2014-10-06 NOTE — Telephone Encounter (Signed)
Find out if she has applied for hospital discount or orange card.  If she has you may place referral for pulmonology. I do not believe she needs a cardiology referral, all of her test were negative. Cardiomegaly is from uncontrolled BP which I explained to her.  Thanks

## 2014-10-10 ENCOUNTER — Ambulatory Visit: Payer: Self-pay | Attending: Internal Medicine | Admitting: Internal Medicine

## 2014-10-10 ENCOUNTER — Encounter: Payer: Self-pay | Admitting: Internal Medicine

## 2014-10-10 VITALS — BP 101/67 | HR 98 | Temp 98.1°F | Resp 16 | Ht 66.0 in | Wt 209.0 lb

## 2014-10-10 DIAGNOSIS — C801 Malignant (primary) neoplasm, unspecified: Secondary | ICD-10-CM | POA: Insufficient documentation

## 2014-10-10 DIAGNOSIS — F329 Major depressive disorder, single episode, unspecified: Secondary | ICD-10-CM | POA: Insufficient documentation

## 2014-10-10 DIAGNOSIS — R011 Cardiac murmur, unspecified: Secondary | ICD-10-CM | POA: Insufficient documentation

## 2014-10-10 DIAGNOSIS — Z720 Tobacco use: Secondary | ICD-10-CM | POA: Insufficient documentation

## 2014-10-10 DIAGNOSIS — J449 Chronic obstructive pulmonary disease, unspecified: Secondary | ICD-10-CM

## 2014-10-10 DIAGNOSIS — Z7951 Long term (current) use of inhaled steroids: Secondary | ICD-10-CM | POA: Insufficient documentation

## 2014-10-10 DIAGNOSIS — R5383 Other fatigue: Secondary | ICD-10-CM | POA: Insufficient documentation

## 2014-10-10 NOTE — Progress Notes (Signed)
F/U Emergency room visit due to edema on lt  Leg Leg now swelling, feeling fatigue. Requested Cardiology and Pulmonary referral

## 2014-10-10 NOTE — Progress Notes (Signed)
Patient ID: Lori Owens, female   DOB: 23-Jun-1961, 53 y.o.   MRN: [id number]  CC: COPD  HPI:  Patient reports that she was seen in the ER for SOB and fatigue on 11/27. She continues to use her Advair daily but not albuterol. Symptoms of SOB and wheezing have resolved. She reports that she has been feeling fatigued for the past month since beginning the Lopressor.  Has stopped taking tylenol PM for sleep.  She states that she tried some Melatonin triple strength that her brother order, and it was too strong.  She is willing to try the OTC regular 5 mg Melatonin now.    No Known Allergies Past Medical History  Diagnosis Date  . Depression 01/18/2013  . Tobacco abuse 01/18/2013  . Headache(784.0)   . COPD (chronic obstructive pulmonary disease)   . Heart murmur   . Cancer    Current Outpatient Prescriptions on File Prior to Visit  Medication Sig Dispense Refill  . albuterol (PROVENTIL HFA;VENTOLIN HFA) 108 (90 BASE) MCG/ACT inhaler Inhale 2 puffs into the lungs every 6 (six) hours as needed for wheezing or shortness of breath. 6.7 g 1  . albuterol (PROVENTIL) (5 MG/ML) 0.5% nebulizer solution Take 0.5 mLs (2.5 mg total) by nebulization every 6 (six) hours as needed for wheezing or shortness of breath (COPD). 20 mL 3  . cholecalciferol (VITAMIN D) 1000 UNITS tablet Take 15,000 Units by mouth daily.    . Fluticasone-Salmeterol (ADVAIR DISKUS) 250-50 MCG/DOSE AEPB Inhale 1 puff into the lungs 2 (two) times daily. 1 each 3  . furosemide (LASIX) 20 MG tablet Take 1 tablet (20 mg total) by mouth daily. 4 tablet 0  . metoprolol tartrate (LOPRESSOR) 25 MG tablet Take 1 tablet (25 mg total) by mouth 2 (two) times daily. 60 tablet 1  . PARoxetine (PAXIL) 40 MG tablet Take 40 mg by mouth every morning.    . potassium chloride 20 MEQ TBCR Take 20 mEq by mouth daily. 4 tablet 0  . Probiotic Product (PROBIOTIC PO) Take 50,000 Units by mouth daily.    . varenicline (CHANTIX PAK) 0.5 MG X 11 & 1 MG X 42  tablet Take one 0.5 mg tablet by mouth once daily for 3 days, then increase to one 0.5 mg tablet twice daily for 4 days, then increase to one 1 mg tablet twice daily. 53 tablet 0  . predniSONE (DELTASONE) 10 MG tablet Take 6 tablets (60 mg total) by mouth daily with breakfast. Start 09/06/14 and decrease by one tablet daily (Patient not taking: Reported on 10/10/2014) 21 tablet 0   No current facility-administered medications on file prior to visit.   Family History  Problem Relation Age of Onset  . Diabetes Maternal Grandfather   . Heart disease Maternal Grandfather   . Cancer Maternal Grandfather   . Hypertension Maternal Grandfather    History   Social History  . Marital Status: Single    Spouse Name: N/A    Number of Children: N/A  . Years of Education: N/A   Occupational History  . Not on file.   Social History Main Topics  . Smoking status: Current Every Day Smoker -- 0.50 packs/day    Last Attempt to Quit: 01/18/2013  . Smokeless tobacco: Never Used  . Alcohol Use: No  . Drug Use: No  . Sexual Activity: No   Other Topics Concern  . Not on file   Social History Narrative   ** Merged History Encounter **  Review of Systems: See HPI  Objective:   Filed Vitals:   10/10/14 1524  BP: 101/67  Pulse: 98  Temp: 98.1 F (36.7 C)  Resp: 16    Physical Exam  Constitutional: She is oriented to person, place, and time. No distress.  Cardiovascular: Normal rate, regular rhythm and normal heart sounds.   No murmur heard. Pulmonary/Chest: Effort normal and breath sounds normal. No respiratory distress. She has no wheezes.  Musculoskeletal: She exhibits no edema.  Neurological: She is alert and oriented to person, place, and time.  Skin: Skin is warm and dry. She is not diaphoretic.     Lab Results  Component Value Date   WBC 5.5 10/04/2014   HGB 13.3 10/04/2014   HCT 40.0 10/04/2014   MCV 93.2 10/04/2014   PLT 289 10/04/2014   Lab Results  Component  Value Date   CREATININE 0.75 10/04/2014   BUN 15 10/04/2014   NA 141 10/04/2014   K 4.7 10/04/2014   CL 104 10/04/2014   CO2 25 10/04/2014    Lab Results  Component Value Date   HGBA1C 5.6 06/06/2014   Lipid Panel  No results found for: CHOL, TRIG, HDL, CHOLHDL, VLDL, LDLCALC     Assessment and plan:   Lori Owens was seen today for follow-up and hospitalization follow-up.  Diagnoses and associated orders for this visit:  Chronic obstructive pulmonary disease, unspecified COPD, unspecified chronic bronchitis type - Lipid panel; Future Continue maintenance and rescue inhaler  Return in about 1 week (around 10/17/2014) for Lab Visit and 3 mo PCP COPD.       Lori Manning, NP-C [facility] [phone] 10/10/2014, 3:49 PM

## 2014-10-10 NOTE — Patient Instructions (Signed)

## 2014-10-16 ENCOUNTER — Ambulatory Visit: Payer: PRIVATE HEALTH INSURANCE | Attending: Internal Medicine

## 2014-10-16 DIAGNOSIS — J449 Chronic obstructive pulmonary disease, unspecified: Secondary | ICD-10-CM

## 2014-10-16 LAB — LIPID PANEL
Cholesterol: 177 mg/dL (ref 0–200)
HDL: 41 mg/dL (ref >39)
LDL CALC: 101 mg/dL — AB (ref 0–99)
Total CHOL/HDL Ratio: 4.3 Ratio
Triglycerides: 176 mg/dL — ABNORMAL HIGH (ref <150)
VLDL: 35 mg/dL (ref 0–40)

## 2014-10-21 ENCOUNTER — Ambulatory Visit: Payer: PRIVATE HEALTH INSURANCE | Attending: Internal Medicine

## 2014-10-21 ENCOUNTER — Telehealth: Payer: Self-pay | Admitting: Internal Medicine

## 2014-10-21 NOTE — Telephone Encounter (Signed)
Pt. Came into facility to request blood work results, please f/u with pt.

## 2014-10-22 NOTE — Telephone Encounter (Signed)
Results posted

## 2014-10-22 NOTE — Telephone Encounter (Signed)
Pt requesting lab work results Please post

## 2014-10-23 ENCOUNTER — Telehealth: Payer: Self-pay | Admitting: Emergency Medicine

## 2014-10-23 NOTE — Telephone Encounter (Signed)
Pt given lab results with instructions to start low fat diet/exercise

## 2014-10-23 NOTE — Telephone Encounter (Signed)
-----   Message from Lance Bosch, NP sent at 10/22/2014 10:20 PM EST ----- Cholesterol slightly elevated. Please provide appropriate education regarding diet and exercise.

## 2014-10-29 ENCOUNTER — Telehealth: Payer: Self-pay | Admitting: Emergency Medicine

## 2014-10-29 NOTE — Telephone Encounter (Signed)
Left message for pt to call for lab results 

## 2014-11-04 ENCOUNTER — Telehealth: Payer: Self-pay | Admitting: Internal Medicine

## 2014-11-04 NOTE — Telephone Encounter (Signed)
Patient has presented to the clinic today to see if she can be seen for numbness in legs; patient is unable to be seen today by PCP and was verbally recommended to go to the urgent care;

## 2014-11-05 ENCOUNTER — Telehealth: Payer: Self-pay | Admitting: Internal Medicine

## 2014-11-05 NOTE — Telephone Encounter (Signed)
Patient called stating that she has been having chest pains for about a week and numbness on one leg. Patient was verbally recommended to go to the ER for treatment to treat for chest pains. Please f/u with pt.

## 2014-11-06 ENCOUNTER — Emergency Department (HOSPITAL_COMMUNITY): Payer: PRIVATE HEALTH INSURANCE

## 2014-11-06 ENCOUNTER — Emergency Department (HOSPITAL_COMMUNITY)
Admission: EM | Admit: 2014-11-06 | Discharge: 2014-11-06 | Disposition: A | Discharge status: Home / Self Care | Payer: PRIVATE HEALTH INSURANCE | Attending: Emergency Medicine | Admitting: Emergency Medicine

## 2014-11-06 ENCOUNTER — Encounter (HOSPITAL_COMMUNITY): Payer: Self-pay | Admitting: Emergency Medicine

## 2014-11-06 DIAGNOSIS — R52 Pain, unspecified: Secondary | ICD-10-CM

## 2014-11-06 DIAGNOSIS — Z720 Tobacco use: Secondary | ICD-10-CM | POA: Insufficient documentation

## 2014-11-06 DIAGNOSIS — R011 Cardiac murmur, unspecified: Secondary | ICD-10-CM | POA: Insufficient documentation

## 2014-11-06 DIAGNOSIS — Z7952 Long term (current) use of systemic steroids: Secondary | ICD-10-CM | POA: Insufficient documentation

## 2014-11-06 DIAGNOSIS — M25512 Pain in left shoulder: Secondary | ICD-10-CM | POA: Insufficient documentation

## 2014-11-06 DIAGNOSIS — Z79899 Other long term (current) drug therapy: Secondary | ICD-10-CM | POA: Insufficient documentation

## 2014-11-06 DIAGNOSIS — I878 Other specified disorders of veins: Secondary | ICD-10-CM

## 2014-11-06 DIAGNOSIS — J449 Chronic obstructive pulmonary disease, unspecified: Secondary | ICD-10-CM | POA: Insufficient documentation

## 2014-11-06 DIAGNOSIS — R202 Paresthesia of skin: Secondary | ICD-10-CM | POA: Insufficient documentation

## 2014-11-06 DIAGNOSIS — F329 Major depressive disorder, single episode, unspecified: Secondary | ICD-10-CM | POA: Insufficient documentation

## 2014-11-06 DIAGNOSIS — Z859 Personal history of malignant neoplasm, unspecified: Secondary | ICD-10-CM | POA: Insufficient documentation

## 2014-11-06 DIAGNOSIS — Z7951 Long term (current) use of inhaled steroids: Secondary | ICD-10-CM | POA: Insufficient documentation

## 2014-11-06 LAB — CBC
HCT: 39.4 % (ref 36.0–46.0)
HEMOGLOBIN: 13.2 g/dL (ref 12.0–15.0)
MCH: 30.8 pg (ref 26.0–34.0)
MCHC: 33.5 g/dL (ref 30.0–36.0)
MCV: 91.8 fL (ref 78.0–100.0)
Platelets: 290 10*3/uL (ref 150–400)
RBC: 4.29 MIL/uL (ref 3.87–5.11)
RDW: 12.4 % (ref 11.5–15.5)
WBC: 5.8 10*3/uL (ref 4.0–10.5)

## 2014-11-06 LAB — BASIC METABOLIC PANEL
Anion gap: 7 (ref 5–15)
BUN: 13 mg/dL (ref 6–23)
CO2: 23 mmol/L (ref 19–32)
Calcium: 9.4 mg/dL (ref 8.4–10.5)
Chloride: 107 mEq/L (ref 96–112)
Creatinine, Ser: 0.64 mg/dL (ref 0.50–1.10)
GFR calc Af Amer: >90 mL/min (ref >90)
GLUCOSE: 95 mg/dL (ref 70–99)
Potassium: 3.9 mmol/L (ref 3.5–5.1)
SODIUM: 137 mmol/L (ref 135–145)

## 2014-11-06 LAB — I-STAT CHEM 8, ED
BUN: 11 mg/dL (ref 6–23)
CHLORIDE: 102 meq/L (ref 96–112)
Calcium, Ion: 1.24 mmol/L — ABNORMAL HIGH (ref 1.12–1.23)
Creatinine, Ser: 0.7 mg/dL (ref 0.50–1.10)
Glucose, Bld: 99 mg/dL (ref 70–99)
HCT: 42 % (ref 36.0–46.0)
Hemoglobin: 14.3 g/dL (ref 12.0–15.0)
POTASSIUM: 3.7 mmol/L (ref 3.5–5.1)
Sodium: 140 mmol/L (ref 135–145)
TCO2: 23 mmol/L (ref 0–100)

## 2014-11-06 LAB — I-STAT TROPONIN, ED: TROPONIN I, POC: 0 ng/mL (ref 0.00–0.08)

## 2014-11-06 LAB — BRAIN NATRIURETIC PEPTIDE: B Natriuretic Peptide: 40.1 pg/mL (ref 0.0–100.0)

## 2014-11-06 NOTE — Discharge Instructions (Signed)
Venous Stasis or Chronic Venous Insufficiency Chronic venous insufficiency, also called venous stasis, is a condition that affects the veins in the legs. The condition prevents blood from being pumped through these veins effectively. Blood may no longer be pumped effectively from the legs back to the heart. This condition can range from mild to severe. With proper treatment, you should be able to continue with an active life. CAUSES  Chronic venous insufficiency occurs when the vein walls become stretched, weakened, or damaged or when valves within the vein are damaged. Some common causes of this include:  High blood pressure inside the veins (venous hypertension).  Increased blood pressure in the leg veins from long periods of sitting or standing.  A blood clot that blocks blood flow in a vein (deep vein thrombosis).  Inflammation of a superficial vein (phlebitis) that causes a blood clot to form. RISK FACTORS Various things can make you more likely to develop chronic venous insufficiency, including:  Family history of this condition.  Obesity.  Pregnancy.  Sedentary lifestyle.  Smoking.  Jobs requiring long periods of standing or sitting in one place.  Being a certain age. Women in their 40s and 50s and men in their 70s are more likely to develop this condition. SIGNS AND SYMPTOMS  Symptoms may include:   Varicose veins.  Skin breakdown or ulcers.  Reddened or discolored skin on the leg.  Brown, smooth, tight, and painful skin just above the ankle, usually on the inside surface (lipodermatosclerosis).  Swelling. DIAGNOSIS  To diagnose this condition, your health care provider will take a medical history and do a physical exam. The following tests may be ordered to confirm the diagnosis:  Duplex ultrasound-A procedure that produces a picture of a blood vessel and nearby organs and also provides information on blood flow through the blood vessel.  Plethysmography-A  procedure that tests blood flow.  A venogram, or venography-A procedure used to look at the veins using X-ray and dye. TREATMENT The goals of treatment are to help you return to an active life and to minimize pain or disability. Treatment will depend on the severity of the condition. Medical procedures may be needed for severe cases. Treatment options may include:   Use of compression stockings. These can help with symptoms and lower the chances of the problem getting worse, but they do not cure the problem.  Sclerotherapy-A procedure involving an injection of a material that "dissolves" the damaged veins. Other veins in the network of blood vessels take over the function of the damaged veins.  Surgery to remove the vein or cut off blood flow through the vein (vein stripping or laser ablation surgery).  Surgery to repair a valve. HOME CARE INSTRUCTIONS   Wear compression stockings as directed by your health care provider.  Only take over-the-counter or prescription medicines for pain, discomfort, or fever as directed by your health care provider.  Follow up with your health care provider as directed. SEEK MEDICAL CARE IF:   You have redness, swelling, or increasing pain in the affected area.  You see a red streak or line that extends up or down from the affected area.  You have a breakdown or loss of skin in the affected area, even if the breakdown is small.  You have an injury to the affected area. SEEK IMMEDIATE MEDICAL CARE IF:   You have an injury and open wound in the affected area.  Your pain is severe and does not improve with medicine.  You have   sudden numbness or weakness in the foot or ankle below the affected area, or you have trouble moving your foot or ankle.  You have a fever or persistent symptoms for more than 2-3 days.  You have a fever and your symptoms suddenly get worse. MAKE SURE YOU:   Understand these instructions.  Will watch your  condition.  Will get help right away if you are not doing well or get worse. Document Released: 02/28/2007 Document Revised: 08/15/2013 Document Reviewed: 07/02/2013 [facility] Patient Information 2015 [place], [place]. This information is not intended to replace advice given to you by your health care provider. Make sure you discuss any questions you have with your health care provider. Paresthesia Paresthesia is an abnormal burning or prickling sensation. This sensation is generally felt in the hands, arms, legs, or feet. However, it may occur in any part of the body. It is usually not painful. The feeling may be described as:  Tingling or numbness.  "Pins and needles."  Skin crawling.  Buzzing.  Limbs "falling asleep."  Itching. Most people experience temporary (transient) paresthesia at some time in their lives. CAUSES  Paresthesia may occur when you breathe too quickly (hyperventilation). It can also occur without any apparent cause. Commonly, paresthesia occurs when pressure is placed on a nerve. The feeling quickly goes away once the pressure is removed. For some people, however, paresthesia is a long-lasting (chronic) condition caused by an underlying disorder. The underlying disorder may be:  A traumatic, direct injury to nerves. Examples include a:  Broken (fractured) neck.  Fractured skull.  A disorder affecting the brain and spinal cord (central nervous system). Examples include:  Transverse myelitis.  Encephalitis.  Transient ischemic attack.  Multiple sclerosis.  Stroke.  Tumor or blood vessel problems, such as an arteriovenous malformation pressing against the brain or spinal cord.  A condition that damages the peripheral nerves (peripheral neuropathy). Peripheral nerves are not part of the brain and spinal cord. These conditions include:  Diabetes.  Peripheral vascular disease.  Nerve entrapment syndromes, such as carpal tunnel  syndrome.  Shingles.  Hypothyroidism.  Vitamin B12 deficiencies.  Alcoholism.  Heavy metal poisoning (lead, arsenic).  Rheumatoid arthritis.  Systemic lupus erythematosus. DIAGNOSIS  Your caregiver will attempt to find the underlying cause of your paresthesia. Your caregiver may:  Take your medical history.  Perform a physical exam.  Order various lab tests.  Order imaging tests. TREATMENT  Treatment for paresthesia depends on the underlying cause. HOME CARE INSTRUCTIONS  Avoid drinking alcohol.  You may consider massage or acupuncture to help relieve your symptoms.  Keep all follow-up appointments as directed by your caregiver. SEEK IMMEDIATE MEDICAL CARE IF:   You feel weak.  You have trouble walking or moving.  You have problems with speech or vision.  You feel confused.  You cannot control your bladder or bowel movements.  You feel numbness after an injury.  You faint.  Your burning or prickling feeling gets worse when walking.  You have pain, cramps, or dizziness.  You develop a rash. MAKE SURE YOU:  Understand these instructions.  Will watch your condition.  Will get help right away if you are not doing well or get worse. Document Released: 10/15/2002 Document Revised: 01/17/2012 Document Reviewed: 07/16/2011 [facility] Patient Information 2015 [place], [place]. This information is not intended to replace advice given to you by your health care provider. Make sure you discuss any questions you have with your health care provider.

## 2014-11-06 NOTE — ED Notes (Signed)
Pt c/o left sided and middle chest numbness that radiates under left arm and also down her right leg.  Pt also has burning on her left side of her back where she has had cancer before.  Pt also c/o right leg swelling.  Pt states that she doenst have insurance and goes to [facility] but wasn't swelling then so wasn't given Lasix. Pt was seen here before and given Lasix and Potassium.

## 2014-11-06 NOTE — ED Provider Notes (Signed)
CSN: [id number]     Arrival date & time 11/06/14  1601 History   First MD Initiated Contact with Patient 11/06/14 1742     Chief Complaint  Patient presents with  . Chest Pain  . Leg Swelling  . Back Pain     (Consider location/radiation/quality/duration/timing/severity/associated sxs/prior Treatment) HPI  The patient reports that she has several problems today. One issue is a discomfort and numbness that she has on the left back of her shoulder and her left anterior lateral chest. The area on her chest is numb feeling. This is somewhat laterally and up towards her shoulder and an area about the size of her hands spread out. Corresponding to this on the back of her shoulder and upper back she reports an intense burning tingling and painful sensation. Even light touch here is very uncomfortable. She does not have any associated shortness of breath or weakness. 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 She has an appointment with her dermatologist for recheck regarding this complaint within 5 days. Her other concern is for her leg swelling. Her right leg swells more than the left. This has been an ongoing intermittent problem. She has had a Doppler ultrasound which did not show any blood clot present. She reports that she had been treated with some Lasix for it and it improved. She reports she was seen at the wellness clinic and she didn't have any swelling at that time so there was no more Lasix prescribed. She reports she does spend a large amount of time on her feet. 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  Past Medical History  Diagnosis Date  . Depression 01/18/2013  . Tobacco abuse 01/18/2013  . Headache(784.0)   . COPD (chronic obstructive pulmonary disease)   . Heart murmur   . Cancer    Past Surgical History  Procedure Laterality Date  .  Throat surgery    . Abdominal hysterectomy    . Nose surgery      polyp removal from nasal cavity   . Tonsillectomy     Family History  Problem Relation Age of Onset  . Diabetes Maternal Grandfather   . Heart disease Maternal Grandfather   . Cancer Maternal Grandfather   . Hypertension Maternal Grandfather    History  Substance Use Topics  . Smoking status: Current Every Day Smoker -- 0.50 packs/day    Last Attempt to Quit: 01/18/2013  . Smokeless tobacco: Never Used  . Alcohol Use: No   OB History    No data available     Review of Systems  10 Systems reviewed and are negative for acute change except as noted in the HPI.   Allergies  Review of patient's allergies indicates no known allergies.  Home Medications   Prior to Admission medications   Medication Sig Start Date End Date Taking? Authorizing Provider  albuterol (PROVENTIL HFA;VENTOLIN HFA) 108 (90 BASE) MCG/ACT inhaler Inhale 2 puffs into the lungs every 6 (six) hours as needed for wheezing or shortness of breath. 09/05/14  Yes Orson Eva, MD  albuterol (PROVENTIL) (5 MG/ML) 0.5% nebulizer solution Take 0.5 mLs (2.5 mg total) by nebulization every 6 (six) hours as needed for wheezing or shortness of breath (COPD). 09/19/14  Yes Lance Bosch, NP  Fluticasone-Salmeterol (ADVAIR DISKUS) 250-50 MCG/DOSE AEPB Inhale 1 puff into  the lungs 2 (two) times daily. 09/19/14  Yes Lance Bosch, NP  Melatonin 10 MG TABS Take 1 tablet by mouth at bedtime as needed (sleep).   Yes Historical Provider, MD  metoprolol tartrate (LOPRESSOR) 25 MG tablet Take 1 tablet (25 mg total) by mouth 2 (two) times daily. 09/19/14  Yes Lance Bosch, NP  PARoxetine (PAXIL) 40 MG tablet Take 40 mg by mouth every morning.   Yes Historical Provider, MD  Probiotic Product (PROBIOTIC PO) Take 1 tablet by mouth daily.    Yes Historical Provider, MD  furosemide (LASIX) 20 MG tablet Take 1 tablet (20 mg total) by mouth daily. Patient not taking:  Reported on 11/06/2014 10/05/14   Quintella Reichert, MD  potassium chloride 20 MEQ TBCR Take 20 mEq by mouth daily. Patient not taking: Reported on 11/06/2014 10/05/14   Quintella Reichert, MD  predniSONE (DELTASONE) 10 MG tablet Take 6 tablets (60 mg total) by mouth daily with breakfast. Start 09/06/14 and decrease by one tablet daily Patient not taking: Reported on 10/10/2014 09/05/14   Orson Eva, MD  varenicline (CHANTIX PAK) 0.5 MG X 11 & 1 MG X 42 tablet Take one 0.5 mg tablet by mouth once daily for 3 days, then increase to one 0.5 mg tablet twice daily for 4 days, then increase to one 1 mg tablet twice daily. Patient not taking: Reported on 11/06/2014 09/19/14   Lance Bosch, NP   BP 135/100 mmHg  Pulse 70  Temp(Src) 97.8 F (36.6 C) (Oral)  Resp 20  SpO2 96% Physical Exam  Constitutional: She is oriented to person, place, and time. She appears well-developed and well-nourished.  HENT:  Head: Normocephalic and atraumatic.  Eyes: EOM are normal. Pupils are equal, round, and reactive to light.  Neck: Neck supple.  Cardiovascular: Normal rate, regular rhythm, normal heart sounds and intact distal pulses.   Pulmonary/Chest: Effort normal and breath sounds normal.  Abdominal: Soft. Bowel sounds are normal. She exhibits no distension. There is no tenderness.  Musculoskeletal: Normal range of motion. She exhibits no edema.  The patient has fairly extensive varicosities of the lower legs. She however does not have any erythema or swelling to suggest an acute phlebitis. She also does not currently have any pitting edema present. Calves are soft and nontender.  Neurological: She is alert and oriented to person, place, and time. She has normal strength. Coordination normal. GCS eye subscore is 4. GCS verbal subscore is 5. GCS motor subscore is 6.  Skin: Skin is warm, dry and intact.  The patient does have a normal skin inspection on her back. She does have several well healed incisions. At this time  there are no suspicious appearing lesions. There are no vesicles present. She does however endorse intense discomfort with light touch over the skin on the back of her shoulder. The anterior chest inspected is again normal in appearance. Here she reports that the sensation feels dulled to touch.  Psychiatric: She has a normal mood and affect.    ED Course  Procedures (including critical care time) Labs Review Labs Reviewed  I-STAT CHEM 8, ED - Abnormal; Notable for the following:    Calcium, Ion 1.24 (*)    All other components within normal limits  CBC  BASIC METABOLIC PANEL  BRAIN NATRIURETIC PEPTIDE  I-STAT TROPOININ, ED    Imaging Review Dg Chest 2 View  11/06/2014   CLINICAL DATA:  Left and central chest pain.  EXAM: CHEST  2 VIEW  COMPARISON:  Radiographs and CT 10/04/2014  FINDINGS: The cardiomediastinal contours are normal. The lungs are clear. Pulmonary vasculature is normal. No consolidation, pleural effusion, or pneumothorax. Stable mild degenerative change in the spine. No acute osseous abnormalities are seen.  IMPRESSION: No acute pulmonary process.   Electronically Signed   By: Jeb Levering M.D.   On: 11/06/2014 16:47     EKG Interpretation   Date/Time:  Wednesday November 06 2014 16:22:19 EST Ventricular Rate:  77 PR Interval:  145 QRS Duration: 88 QT Interval:  383 QTC Calculation: 433 R Axis:   29 Text Interpretation:  Sinus rhythm Low voltage, precordial leads normal.  no change Confirmed by Johnney Killian, MD, Jeannie Done [phone]) on 11/06/2014 6:31:15  PM      MDM   Final diagnoses:  Venous stasis  Paresthesia   At this time the patient does not appear to have any acute cardiac dysfunction. She did have an echo performed approximately 2 months ago that showed EF of 60% and grade 1 diastolic dysfunction. She also had bilateral lower extremity ultrasounds performed that did not show any DVT. On physical examination today the cardiac exam is normal without any  murmur present. Her lungs are clear without any evidence of vascular congestion. The lower extremities do not have peripheral edema present although she does have significant varicosities. At this time I do feel the swelling is consistent with venous stasis. She describes it as being intermittent and often associated with prolonged standing or sitting. I feel patient is safe for discharge she does have close follow-up with dermatology regarding her area of dysesthesia/ paresthesia and she was counseled on the possibility of early shingles. The patient has a prior history of shingles and is aware of what to watch for.    Charlesetta Shanks, MD 11/06/14 2040

## 2014-11-06 NOTE — ED Notes (Signed)
D/C instructions discussed with patient, pt verbalized understanding to follow up with MD tomorrow. Shingles discussed with patient at length, pt concerned this was reoccuring to L back. Pt advised to discuss treatment options with PCP tomorrow. A & O,

## 2014-12-05 ENCOUNTER — Ambulatory Visit: Payer: Self-pay | Admitting: Interventional Cardiology

## 2014-12-07 ENCOUNTER — Emergency Department (HOSPITAL_COMMUNITY): Payer: Self-pay

## 2014-12-07 ENCOUNTER — Emergency Department (HOSPITAL_COMMUNITY)
Admission: EM | Admit: 2014-12-07 | Discharge: 2014-12-07 | Disposition: A | Discharge status: Home / Self Care | Payer: 59 | Attending: Emergency Medicine | Admitting: Emergency Medicine

## 2014-12-07 ENCOUNTER — Encounter (HOSPITAL_COMMUNITY): Payer: Self-pay | Admitting: Emergency Medicine

## 2014-12-07 DIAGNOSIS — Z7951 Long term (current) use of inhaled steroids: Secondary | ICD-10-CM | POA: Insufficient documentation

## 2014-12-07 DIAGNOSIS — J441 Chronic obstructive pulmonary disease with (acute) exacerbation: Secondary | ICD-10-CM | POA: Insufficient documentation

## 2014-12-07 DIAGNOSIS — Z8589 Personal history of malignant neoplasm of other organs and systems: Secondary | ICD-10-CM | POA: Insufficient documentation

## 2014-12-07 DIAGNOSIS — Z79899 Other long term (current) drug therapy: Secondary | ICD-10-CM | POA: Insufficient documentation

## 2014-12-07 DIAGNOSIS — Z8659 Personal history of other mental and behavioral disorders: Secondary | ICD-10-CM | POA: Insufficient documentation

## 2014-12-07 DIAGNOSIS — Z720 Tobacco use: Secondary | ICD-10-CM | POA: Insufficient documentation

## 2014-12-07 DIAGNOSIS — R0602 Shortness of breath: Secondary | ICD-10-CM

## 2014-12-07 DIAGNOSIS — R5383 Other fatigue: Secondary | ICD-10-CM | POA: Insufficient documentation

## 2014-12-07 DIAGNOSIS — G4730 Sleep apnea, unspecified: Secondary | ICD-10-CM | POA: Insufficient documentation

## 2014-12-07 DIAGNOSIS — R011 Cardiac murmur, unspecified: Secondary | ICD-10-CM | POA: Insufficient documentation

## 2014-12-07 LAB — BASIC METABOLIC PANEL
Anion gap: 7 (ref 5–15)
BUN: 15 mg/dL (ref 6–23)
CHLORIDE: 109 mmol/L (ref 96–112)
CO2: 24 mmol/L (ref 19–32)
Calcium: 9.1 mg/dL (ref 8.4–10.5)
Creatinine, Ser: 0.7 mg/dL (ref 0.50–1.10)
GFR calc Af Amer: >90 mL/min (ref >90)
GLUCOSE: 101 mg/dL — AB (ref 70–99)
POTASSIUM: 4.1 mmol/L (ref 3.5–5.1)
SODIUM: 140 mmol/L (ref 135–145)

## 2014-12-07 LAB — CBC
HCT: 41.7 % (ref 36.0–46.0)
Hemoglobin: 13.8 g/dL (ref 12.0–15.0)
MCH: 30.4 pg (ref 26.0–34.0)
MCHC: 33.1 g/dL (ref 30.0–36.0)
MCV: 91.9 fL (ref 78.0–100.0)
Platelets: 270 10*3/uL (ref 150–400)
RBC: 4.54 MIL/uL (ref 3.87–5.11)
RDW: 12.2 % (ref 11.5–15.5)
WBC: 6.4 10*3/uL (ref 4.0–10.5)

## 2014-12-07 LAB — I-STAT TROPONIN, ED: TROPONIN I, POC: 0 ng/mL (ref 0.00–0.08)

## 2014-12-07 NOTE — ED Notes (Signed)
Pt. Is OK for chest Xray at this time, stated that earlier she declined because she did not think she needs it. To notify Xray Tech.

## 2014-12-07 NOTE — ED Provider Notes (Signed)
CSN: [id number]     Arrival date & time 12/07/14  2029 History   First MD Initiated Contact with Patient 12/07/14 2153     Chief Complaint  Patient presents with  . Chest Pain  . Dizziness  . Shortness of Breath      HPI Pt reports mid and left chest pain radiating to left shoulder starting last night. Hx COPD. Has associating dizziness, SOB, back pain. Also has sinus congestion.  According to husband patient does have significant snoring and what sounds like sleep apnea during the night.  Ambulatory. RR even/unlabored. Patient says, "I was waiting it out until my insurance kicked in." Patient has appointment 2/16 with MD Turner with cardiology. Also has pulmonologist appointment 2/13. No other c/c Past Medical History  Diagnosis Date  . Depression 01/18/2013  . Tobacco abuse 01/18/2013  . Headache(784.0)   . COPD (chronic obstructive pulmonary disease)   . Heart murmur   . Cancer    Past Surgical History  Procedure Laterality Date  . Throat surgery    . Abdominal hysterectomy    . Nose surgery      polyp removal from nasal cavity   . Tonsillectomy     Family History  Problem Relation Age of Onset  . Diabetes Maternal Grandfather   . Heart disease Maternal Grandfather   . Cancer Maternal Grandfather   . Hypertension Maternal Grandfather    History  Substance Use Topics  . Smoking status: Current Every Day Smoker -- 0.50 packs/day    Last Attempt to Quit: 01/18/2013  . Smokeless tobacco: Never Used  . Alcohol Use: No   OB History    No data available     Review of Systems  All other systems reviewed and are negative  Allergies  Review of patient's allergies indicates no known allergies.  Home Medications   Prior to Admission medications   Medication Sig Start Date End Date Taking? Authorizing Provider  albuterol (PROVENTIL) (5 MG/ML) 0.5% nebulizer solution Take 0.5 mLs (2.5 mg total) by nebulization every 6 (six) hours as needed for wheezing or shortness of  breath (COPD). 09/19/14  Yes Lance Bosch, NP  Fluticasone-Salmeterol (ADVAIR DISKUS) 250-50 MCG/DOSE AEPB Inhale 1 puff into the lungs 2 (two) times daily. 09/19/14  Yes Lance Bosch, NP  Melatonin 10 MG TABS Take 1 tablet by mouth at bedtime as needed (sleep).   Yes Historical Provider, MD  metoprolol tartrate (LOPRESSOR) 25 MG tablet Take 1 tablet (25 mg total) by mouth 2 (two) times daily. 09/19/14  Yes Lance Bosch, NP  PARoxetine (PAXIL) 40 MG tablet Take 40 mg by mouth every morning.   Yes Historical Provider, MD  Probiotic Product (PROBIOTIC PO) Take 1 tablet by mouth daily.    Yes Historical Provider, MD  albuterol (PROVENTIL HFA;VENTOLIN HFA) 108 (90 BASE) MCG/ACT inhaler Inhale 2 puffs into the lungs every 6 (six) hours as needed for wheezing or shortness of breath. 09/05/14   Orson Eva, MD  furosemide (LASIX) 20 MG tablet Take 1 tablet (20 mg total) by mouth daily. Patient not taking: Reported on 12/07/2014 10/05/14   Quintella Reichert, MD  potassium chloride 20 MEQ TBCR Take 20 mEq by mouth daily. Patient not taking: Reported on 11/06/2014 10/05/14   Quintella Reichert, MD  predniSONE (DELTASONE) 10 MG tablet Take 6 tablets (60 mg total) by mouth daily with breakfast. Start 09/06/14 and decrease by one tablet daily Patient not taking: Reported on 10/10/2014 09/05/14   Orson Eva,  MD  varenicline (CHANTIX PAK) 0.5 MG X 11 & 1 MG X 42 tablet Take one 0.5 mg tablet by mouth once daily for 3 days, then increase to one 0.5 mg tablet twice daily for 4 days, then increase to one 1 mg tablet twice daily. Patient not taking: Reported on 11/06/2014 09/19/14   Lance Bosch, NP   BP 142/92 mmHg  Pulse 97  Temp(Src) 97.8 F (36.6 C) (Oral)  Resp 19  SpO2 97% Physical Exam Physical Exam  Nursing note and vitals reviewed. Constitutional: She is oriented to person, place, and time. She appears well-developed and well-nourished. No distress.  HENT:  Head: Normocephalic and atraumatic.  Eyes:  Pupils are equal, round, and reactive to light.  Neck: Normal range of motion.  Cardiovascular: Normal rate and intact distal pulses.   Pulmonary/Chest: No respiratory distress.  Abdominal: Normal appearance. She exhibits no distension.  Musculoskeletal: Normal range of motion.  Neurological: She is alert and oriented to person, place, and time. No cranial nerve deficit.  Skin: Skin is warm and dry. No rash noted.  Psychiatric: She has a normal mood and affect. Her behavior is normal.   ED Course  Procedures (including critical care time) Labs Review Labs Reviewed  BASIC METABOLIC PANEL - Abnormal; Notable for the following:    Glucose, Bld 101 (*)    All other components within normal limits  CBC  I-STAT TROPOININ, ED    Imaging Review No results found.   EKG Interpretation   Date/Time:  Saturday December 07 2014 20:37:03 EST Ventricular Rate:  98 PR Interval:  130 QRS Duration: 83 QT Interval:  339 QTC Calculation: 433 R Axis:   34 Text Interpretation:  Sinus rhythm Low voltage, precordial leads Abnormal  R-wave progression, early transition Confirmed by Maaliyah Adolph  MD, Renton Berkley  ([id number]) on 12/07/2014 10:21:44 PM Also confirmed by Audie Pinto  MD, Lacorey Brusca  [phone]), editor [place]  CCT, [place] ([id number])  on 12/08/2014 12:58:36 PM      MDM   Final diagnoses:  SOB (shortness of breath)  Other fatigue  Sleep apnea        Dot Lanes, MD 12/12/14 [phone]

## 2014-12-07 NOTE — ED Notes (Signed)
Pt reports mid and left chest pain radiating to left shoulder starting last night. Hx COPD. Has associating dizziness, SOB, back pain. Also has sinus congestion. Ambulatory. RR even/unlabored. Patient says, "I was waiting it out until my insurance kicked in." Patient has appointment 2/16 with MD Turner with cardiology. Also has pulmonologist appointment 2/13. No other c/c.

## 2014-12-07 NOTE — Discharge Instructions (Signed)
Sleep Apnea  Sleep apnea is disorder that affects a person's sleep. A person with sleep apnea has abnormal pauses in their breathing when they sleep. It is hard for them to get a good sleep. This makes a person tired during the day. It also can lead to other physical problems. There are three types of sleep apnea. One type is when breathing stops for a short time because your airway is blocked (obstructive sleep apnea). Another type is when the brain sometimes fails to give the normal signal to breathe to the muscles that control your breathing (central sleep apnea). The third type is a combination of the other two types.  HOME CARE  · Do not sleep on your back. Try to sleep on your side.  · Take all medicine as told by your doctor.  · Avoid alcohol, calming medicines (sedatives), and depressant drugs.  · Try to lose weight if you are overweight. Talk to your doctor about a healthy weight goal.  Your doctor may have you use a device that helps to open your airway. It can help you get the air that you need. It is called a positive airway pressure (PAP) device. There are three types of PAP devices:  · Continuous positive airway pressure (CPAP) device.  · Nasal expiratory positive airway pressure (EPAP) device.  · Bilevel positive airway pressure (BPAP) device.  MAKE SURE YOU:  · Understand these instructions.  · Will watch your condition.  · Will get help right away if you are not doing well or get worse.  Document Released: 08/03/2008 Document Revised: 10/11/2012 Document Reviewed: 02/26/2012  ExitCare® Patient Information ©2015 [employer]. This information is not intended to replace advice given to you by your health care provider. Make sure you discuss any questions you have with your health care provider.

## 2014-12-16 ENCOUNTER — Other Ambulatory Visit: Payer: Self-pay | Admitting: General Practice

## 2014-12-16 NOTE — Telephone Encounter (Signed)
Patient calling to request medication refill for the following medication: PARoxetine (PAXIL) 40 MG tablet Patient transferred to nurse line. Please assist.

## 2014-12-16 NOTE — Telephone Encounter (Signed)
Rx Paxil was not prescribe by our Dr Hulen Skains pt for more information LVM to return call

## 2014-12-23 DIAGNOSIS — R0681 Apnea, not elsewhere classified: Secondary | ICD-10-CM | POA: Insufficient documentation

## 2014-12-23 DIAGNOSIS — R079 Chest pain, unspecified: Secondary | ICD-10-CM | POA: Insufficient documentation

## 2014-12-23 DIAGNOSIS — R0683 Snoring: Secondary | ICD-10-CM | POA: Insufficient documentation

## 2014-12-23 NOTE — Progress Notes (Signed)
Cardiology Office Note   Date:  12/24/2014   ID:  Lori Owens, DOB [birth year], MRN [id number]  PCP:  Chari Manning, NP  Cardiologist:   Sueanne Margarita, MD   Chief Complaint  Patient presents with  . Chest Pain      History of Present Illness: Lori Owens is a 54 y.o. female who presents for evaluation of chest pain.  The patient was seen in the ER on 12/07/2014 with complaints of mid and left sided chest pain radiating to the left shoulder.  She had associated SOB and back pain.  She also has a history of COPD.  She has been told that she snores by her husband and he thinks she has sleep apnea.  She ruled out for MI by serial enzymes and BNP was normal. EKG showed NSR with no ST changes.   2D echo this past fall showed normal LVF with diastolic dysfunction and mild LVH.  She describes the chest pain as an aching sensation that occurs on and off all day usually if she has been on her feet a long time.  She gets tingling in her arms when she gets the chest discomfort.  She will get nauseated with the discomfort.  She has a lot of anxiety and this prevents her from sleeping.  She also has been having SOB which is different from what she gets with her COPD.  This SOB comes on with exertion.  She also has had some problems with RLE swelling and had a LE venous doppler that showed no DVT.  She says that her heart races intermittently and prevents her from sleeping some.      Past Medical History  Diagnosis Date  . Depression 01/18/2013  . Tobacco abuse 01/18/2013  . Headache(784.0)   . COPD (chronic obstructive pulmonary disease)   . Heart murmur   . Melanoma     Past Surgical History  Procedure Laterality Date  . Throat surgery    . Abdominal hysterectomy    . Nose surgery      polyp removal from nasal cavity   . Tonsillectomy       Current Outpatient Prescriptions  Medication Sig Dispense Refill  . albuterol (PROVENTIL HFA;VENTOLIN HFA) 108 (90 BASE) MCG/ACT inhaler Inhale  2 puffs into the lungs every 6 (six) hours as needed for wheezing or shortness of breath. 6.7 g 1  . albuterol (PROVENTIL) (5 MG/ML) 0.5% nebulizer solution Take 0.5 mLs (2.5 mg total) by nebulization every 6 (six) hours as needed for wheezing or shortness of breath (COPD). 20 mL 3  . Fluticasone-Salmeterol (ADVAIR DISKUS) 250-50 MCG/DOSE AEPB Inhale 1 puff into the lungs 2 (two) times daily. 1 each 3  . Melatonin 10 MG TABS Take 1 tablet by mouth at bedtime as needed (sleep).    . metoprolol tartrate (LOPRESSOR) 25 MG tablet Take 1 tablet (25 mg total) by mouth 2 (two) times daily. 60 tablet 1  . PARoxetine (PAXIL) 40 MG tablet Take 40 mg by mouth every morning.    . Probiotic Product (PROBIOTIC PO) Take 1 tablet by mouth daily.      No current facility-administered medications for this visit.    Allergies:   Review of patient's allergies indicates no known allergies.    Social History:  The patient  reports that she has been smoking.  She has never used smokeless tobacco. She reports that she does not drink alcohol or use illicit drugs.   Family History:  The patient's  family history includes Cancer in her maternal grandfather; Diabetes in her maternal grandfather; Heart disease in her maternal grandfather; Hypertension in her maternal grandfather.    ROS:  Please see the history of present illness.   Otherwise, review of systems are positive for none.   All other systems are reviewed and negative.    PHYSICAL EXAM: VS:  BP 118/92 mmHg  Pulse 75  Ht 5\' 6"  (1.676 m)  Wt 214 lb (97.07 kg)  BMI 34.56 kg/m2 , BMI Body mass index is 34.56 kg/(m^2). GEN: Well nourished, well developed, in no acute distress HEENT: normal Neck: no JVD, carotid bruits, or masses Cardiac: RRR; no murmurs, rubs, or gallops,no edema  Respiratory:  clear to auscultation bilaterally, normal work of breathing GI: soft, nontender, nondistended, + BS MS: no deformity or atrophy Skin: warm and dry, no rash Neuro:   Strength and sensation are intact Psych: euthymic mood, full affect   EKG:  EKG is not ordered today.     Recent Labs: 09/02/2014: ALT 25 09/03/2014: Magnesium 2.0 09/05/2014: TSH 0.590 10/04/2014: Pro B Natriuretic peptide (BNP) 105.1 11/06/2014: B Natriuretic Peptide 40.1 12/07/2014: BUN 15; Creatinine 0.70; Hemoglobin 13.8; Platelets 270; Potassium 4.1; Sodium 140    Lipid Panel    Component Value Date/Time   CHOL 177 10/16/2014 0924   TRIG 176* 10/16/2014 0924   HDL 41 10/16/2014 0924   CHOLHDL 4.3 10/16/2014 0924   VLDL 35 10/16/2014 0924   LDLCALC 101* 10/16/2014 0924      Wt Readings from Last 3 Encounters:  12/24/14 214 lb (97.07 kg)  10/10/14 209 lb (94.802 kg)  09/19/14 208 lb (94.348 kg)       ASSESSMENT AND PLAN:  1.  Chest pain.  Recent ER workup negative.  EKG is nonischemic and echo 3 months ago with normal LVF.  I will get an ETT to rule out ischemia.   2.  COPD - per PCP 3.  Snoring with periods of witnessed apnea - I will order a split night PSG to rule out sleep apnea 4.  DOE which could be due to COPD.  I will get a 2D echo to assess LVF 5.  Palpitations - need to make sure she is not having PAF>  Will get an event monitor to assess  Current medicines are reviewed at length with the patient today.  The patient does not have concerns regarding medicines.  The following changes have been made:  no change  Labs/ tests ordered today include: ETT, 2D echo, event monitor, sleep study    Disposition:   FU with me in PRN pending results of studies   SignedSueanne Margarita, MD  12/24/2014 11:02 AM    [address] [place] [place], [place]  [address] Phone: [phone]; Fax: [phone]

## 2014-12-24 ENCOUNTER — Encounter: Payer: Self-pay | Admitting: Cardiology

## 2014-12-24 ENCOUNTER — Ambulatory Visit (INDEPENDENT_AMBULATORY_CARE_PROVIDER_SITE_OTHER): Payer: 59 | Admitting: Cardiology

## 2014-12-24 VITALS — BP 118/92 | HR 75 | Ht 66.0 in | Wt 214.0 lb

## 2014-12-24 DIAGNOSIS — R0602 Shortness of breath: Secondary | ICD-10-CM

## 2014-12-24 DIAGNOSIS — R0683 Snoring: Secondary | ICD-10-CM

## 2014-12-24 DIAGNOSIS — R079 Chest pain, unspecified: Secondary | ICD-10-CM

## 2014-12-24 DIAGNOSIS — R0681 Apnea, not elsewhere classified: Secondary | ICD-10-CM

## 2014-12-24 DIAGNOSIS — R002 Palpitations: Secondary | ICD-10-CM | POA: Insufficient documentation

## 2014-12-24 NOTE — Patient Instructions (Signed)
Your physician has requested that you have an echocardiogram. Echocardiography is a painless test that uses sound waves to create images of your heart. It provides your doctor with information about the size and shape of your heart and how well your heart's chambers and valves are working. This procedure takes approximately one hour. There are no restrictions for this procedure.  Your physician has requested that you have an exercise tolerance test. For further information please visit [email or web address]. Please also follow instruction sheet, as given.  Your physician has recommended that you wear an event monitor. Event monitors are medical devices that record the heart's electrical activity. Doctors most often [place] these monitors to diagnose arrhythmias. Arrhythmias are problems with the speed or rhythm of the heartbeat. The monitor is a small, portable device. You can wear one while you do your normal daily activities. This is usually used to diagnose what is causing palpitations/syncope (passing out).  Your physician has recommended that you have a sleep study. This test records several body functions during sleep, including: brain activity, eye movement, oxygen and carbon dioxide blood levels, heart rate and rhythm, breathing rate and rhythm, the flow of air through your mouth and nose, snoring, body muscle movements, and chest and belly movement.  Your physician recommends that you schedule a follow-up appointment AS NEEDED with Dr. Radford Pax pending your test results.

## 2014-12-26 ENCOUNTER — Encounter (INDEPENDENT_AMBULATORY_CARE_PROVIDER_SITE_OTHER): Payer: 59

## 2014-12-26 ENCOUNTER — Encounter: Payer: Self-pay | Admitting: *Deleted

## 2014-12-26 ENCOUNTER — Ambulatory Visit (HOSPITAL_COMMUNITY): Payer: 59 | Attending: Cardiovascular Disease | Admitting: Radiology

## 2014-12-26 DIAGNOSIS — Z720 Tobacco use: Secondary | ICD-10-CM | POA: Insufficient documentation

## 2014-12-26 DIAGNOSIS — R079 Chest pain, unspecified: Secondary | ICD-10-CM

## 2014-12-26 DIAGNOSIS — R0602 Shortness of breath: Secondary | ICD-10-CM

## 2014-12-26 DIAGNOSIS — R002 Palpitations: Secondary | ICD-10-CM

## 2014-12-26 NOTE — Progress Notes (Signed)
Patient ID: Lori Owens, female   DOB: Jun 14, 1961, 54 y.o.   MRN: [id number] [employer] 30 day cardiac event monitor applied to patient.

## 2014-12-26 NOTE — Progress Notes (Signed)
Echocardiogram performed.  

## 2014-12-31 ENCOUNTER — Other Ambulatory Visit: Payer: Self-pay | Admitting: Internal Medicine

## 2015-01-29 ENCOUNTER — Ambulatory Visit: Payer: 59 | Admitting: Physician Assistant

## 2015-01-29 ENCOUNTER — Ambulatory Visit (INDEPENDENT_AMBULATORY_CARE_PROVIDER_SITE_OTHER): Payer: 59 | Admitting: Physician Assistant

## 2015-01-29 DIAGNOSIS — R0602 Shortness of breath: Secondary | ICD-10-CM

## 2015-01-29 DIAGNOSIS — R079 Chest pain, unspecified: Secondary | ICD-10-CM

## 2015-01-29 NOTE — Progress Notes (Signed)
Exercise Treadmill Test  Pre-Exercise Testing Evaluation Rhythm: sinus tachycardia  Rate: 93 bpm     Test  Exercise Tolerance Test Ordering MD: Fransico Him, MD  Interpreting MD: Richardson Dopp, PA-C  Unique Test No: 1  Treadmill:  1  Indication for ETT: chest pain - rule out ischemia  Contraindication to ETT: No   Stress Modality: exercise - treadmill  Cardiac Imaging Performed: non   Protocol: standard Bruce - maximal  Max BP:  182/71  Max MPHR (bpm):  167 85% MPR (bpm):  142  MPHR obtained (bpm):  150 % MPHR obtained:  89  Reached 85% MPHR (min:sec):  4:06 Total Exercise Time (min-sec):  6:00  Workload in METS:  7.0 Borg Scale: 19  Reason ETT Terminated:  patient's desire to stop    ST Segment Analysis At Rest: normal ST segments - no evidence of significant ST depression With Exercise: non-specific ST changes  Other Information Arrhythmia:  No Angina during ETT:  absent (0) Quality of ETT:  diagnostic  ETT Interpretation:  normal - no evidence of ischemia by ST analysis  Comments: Fair exercise capacity. No chest pain. Normal BP response to exercise. There was some wandering artifact during exercise. There were No significant ST changes to suggest ischemia.   Recommendations: FU with Dr. Fransico Him as planned. Signed,  Richardson Dopp, PA-C   01/29/2015 9:40 AM

## 2015-01-30 ENCOUNTER — Emergency Department (HOSPITAL_COMMUNITY): Payer: 59

## 2015-01-30 ENCOUNTER — Encounter (HOSPITAL_COMMUNITY): Payer: Self-pay | Admitting: *Deleted

## 2015-01-30 ENCOUNTER — Emergency Department (HOSPITAL_COMMUNITY)
Admission: EM | Admit: 2015-01-30 | Discharge: 2015-01-31 | Disposition: A | Discharge status: Home / Self Care | Payer: 59 | Attending: Emergency Medicine | Admitting: Emergency Medicine

## 2015-01-30 DIAGNOSIS — R011 Cardiac murmur, unspecified: Secondary | ICD-10-CM | POA: Diagnosis not present

## 2015-01-30 DIAGNOSIS — Z8659 Personal history of other mental and behavioral disorders: Secondary | ICD-10-CM | POA: Diagnosis not present

## 2015-01-30 DIAGNOSIS — Z7951 Long term (current) use of inhaled steroids: Secondary | ICD-10-CM | POA: Insufficient documentation

## 2015-01-30 DIAGNOSIS — J449 Chronic obstructive pulmonary disease, unspecified: Secondary | ICD-10-CM | POA: Insufficient documentation

## 2015-01-30 DIAGNOSIS — Z79899 Other long term (current) drug therapy: Secondary | ICD-10-CM | POA: Diagnosis not present

## 2015-01-30 DIAGNOSIS — E669 Obesity, unspecified: Secondary | ICD-10-CM | POA: Insufficient documentation

## 2015-01-30 DIAGNOSIS — Z85820 Personal history of malignant melanoma of skin: Secondary | ICD-10-CM | POA: Insufficient documentation

## 2015-01-30 DIAGNOSIS — R4182 Altered mental status, unspecified: Secondary | ICD-10-CM

## 2015-01-30 LAB — CBC
HCT: 40.5 % (ref 36.0–46.0)
Hemoglobin: 13.2 g/dL (ref 12.0–15.0)
MCH: 30.7 pg (ref 26.0–34.0)
MCHC: 32.6 g/dL (ref 30.0–36.0)
MCV: 94.2 fL (ref 78.0–100.0)
Platelets: 280 10*3/uL (ref 150–400)
RBC: 4.3 MIL/uL (ref 3.87–5.11)
RDW: 12.9 % (ref 11.5–15.5)
WBC: 7.4 10*3/uL (ref 4.0–10.5)

## 2015-01-30 LAB — ETHANOL: Alcohol, Ethyl (B): <5 mg/dL (ref 0–9)

## 2015-01-30 LAB — RAPID URINE DRUG SCREEN, HOSP PERFORMED
Amphetamines: NOT DETECTED
BARBITURATES: NOT DETECTED
Benzodiazepines: NOT DETECTED
COCAINE: NOT DETECTED
OPIATES: NOT DETECTED
Tetrahydrocannabinol: NOT DETECTED

## 2015-01-30 LAB — COMPREHENSIVE METABOLIC PANEL
ALK PHOS: 97 U/L (ref 39–117)
ALT: 21 U/L (ref 0–35)
ANION GAP: 9 (ref 5–15)
AST: 25 U/L (ref 0–37)
Albumin: 4 g/dL (ref 3.5–5.2)
BUN: 17 mg/dL (ref 6–23)
CALCIUM: 9.1 mg/dL (ref 8.4–10.5)
CO2: 26 mmol/L (ref 19–32)
Chloride: 107 mmol/L (ref 96–112)
Creatinine, Ser: 0.81 mg/dL (ref 0.50–1.10)
GFR calc Af Amer: >90 mL/min (ref >90)
GFR calc non Af Amer: 81 mL/min — ABNORMAL LOW (ref >90)
Glucose, Bld: 101 mg/dL — ABNORMAL HIGH (ref 70–99)
Potassium: 4.1 mmol/L (ref 3.5–5.1)
Sodium: 142 mmol/L (ref 135–145)
TOTAL PROTEIN: 6.8 g/dL (ref 6.0–8.3)
Total Bilirubin: 0.1 mg/dL — ABNORMAL LOW (ref 0.3–1.2)

## 2015-01-30 LAB — URINALYSIS, ROUTINE W REFLEX MICROSCOPIC
Bilirubin Urine: NEGATIVE
Glucose, UA: NEGATIVE mg/dL
Hgb urine dipstick: NEGATIVE
Ketones, ur: NEGATIVE mg/dL
Leukocytes, UA: NEGATIVE
Nitrite: NEGATIVE
PROTEIN: NEGATIVE mg/dL
Specific Gravity, Urine: 1.025 (ref 1.005–1.030)
UROBILINOGEN UA: 0.2 mg/dL (ref 0.0–1.0)
pH: 5 (ref 5.0–8.0)

## 2015-01-30 LAB — ACETAMINOPHEN LEVEL

## 2015-01-30 LAB — SALICYLATE LEVEL: Salicylate Lvl: <4 mg/dL (ref 2.8–20.0)

## 2015-01-30 LAB — CBG MONITORING, ED: Glucose-Capillary: 122 mg/dL — ABNORMAL HIGH (ref 70–99)

## 2015-01-30 MED ORDER — SODIUM CHLORIDE 0.9 % IV BOLUS (SEPSIS)
1000.0000 mL | Freq: Once | INTRAVENOUS | Status: AC
  Administered 2015-01-30: 1000 mL via INTRAVENOUS

## 2015-01-30 NOTE — ED Notes (Signed)
Patient transported to CT 

## 2015-01-30 NOTE — ED Provider Notes (Signed)
CSN: [id number]     Arrival date & time 01/30/15  2034 History   First MD Initiated Contact with Patient 01/30/15 2059     Chief Complaint  Patient presents with  . Altered Mental Status      HPI  Patient presents with her husband, who provides much of the history of present illness, as the patient is not speaking. Level V caveat. Patient was seen normal by her husband yesterday, and upon arriving home today, 2 hours ago, he found her listless, minimally interactive. Nurses report that the patient was ambulatory, following commands on arrival, but on my exam is lying in the gurney, staring at the ceiling, minimally interactive. She seems to deny pain, acknowledges fatigue, but does not respond to the questions, including use of additional medication, intoxicants. Patient takes Benadryl for allergies, and may have taken additional amounts today.   Past Medical History  Diagnosis Date  . Depression 01/18/2013  . Tobacco abuse 01/18/2013  . Headache(784.0)   . COPD (chronic obstructive pulmonary disease)   . Heart murmur   . Melanoma    Past Surgical History  Procedure Laterality Date  . Throat surgery    . Abdominal hysterectomy    . Nose surgery      polyp removal from nasal cavity   . Tonsillectomy     Family History  Problem Relation Age of Onset  . Diabetes Maternal Grandfather   . Heart disease Maternal Grandfather   . Cancer Maternal Grandfather   . Hypertension Maternal Grandfather    History  Substance Use Topics  . Smoking status: Current Every Day Smoker -- 0.50 packs/day    Last Attempt to Quit: 01/18/2013  . Smokeless tobacco: Never Used  . Alcohol Use: No   OB History    No data available     Review of Systems  Unable to perform ROS: Patient nonverbal      Allergies  Review of patient's allergies indicates no known allergies.  Home Medications   Prior to Admission medications   Medication Sig Start Date End Date Taking? Authorizing Provider   albuterol (PROVENTIL HFA;VENTOLIN HFA) 108 (90 BASE) MCG/ACT inhaler Inhale 2 puffs into the lungs every 6 (six) hours as needed for wheezing or shortness of breath. 09/05/14   Orson Eva, MD  albuterol (PROVENTIL) (5 MG/ML) 0.5% nebulizer solution Take 0.5 mLs (2.5 mg total) by nebulization every 6 (six) hours as needed for wheezing or shortness of breath (COPD). 09/19/14   Lance Bosch, NP  Fluticasone-Salmeterol (ADVAIR DISKUS) 250-50 MCG/DOSE AEPB Inhale 1 puff into the lungs 2 (two) times daily. 09/19/14   Lance Bosch, NP  Melatonin 10 MG TABS Take 1 tablet by mouth at bedtime as needed (sleep).    Historical Provider, MD  metoprolol tartrate (LOPRESSOR) 25 MG tablet TAKE ONE TABLET BY MOUTH TWICE DAILY 01/06/15   Lance Bosch, NP  PARoxetine (PAXIL) 40 MG tablet Take 40 mg by mouth every morning.    Historical Provider, MD  Probiotic Product (PROBIOTIC PO) Take 1 tablet by mouth daily.     Historical Provider, MD   BP 137/80 mmHg  Pulse 92  Temp(Src) 97.6 F (36.4 C) (Oral)  Resp 18  SpO2 96% Physical Exam  Constitutional: She appears well-developed and well-nourished. No distress.  Obese female resting in gurney, staring straight ahead, not following commands  HENT:  Head: Normocephalic and atraumatic.  Eyes: Conjunctivae and EOM are normal.  Pupils are midrange, reactive, and the patient minimally  follows visual tracking  Neck: No tracheal deviation present.  Cardiovascular: Normal rate and regular rhythm.   Pulmonary/Chest: Effort normal and breath sounds normal. No stridor. No respiratory distress.  Abdominal: She exhibits no distension.  Musculoskeletal: She exhibits no edema.  Neurological: She is alert. No cranial nerve deficit.  Patient is alert, seems to be listening, but does not respond appropriately to commands. With the patient's arms lifted up, she does not allow it to strike her head.  Skin: Skin is warm and dry.  Psychiatric: She is noncommunicative.   Nursing note and vitals reviewed.   ED Course  Procedures (including critical care time) Labs Review Labs Reviewed  COMPREHENSIVE METABOLIC PANEL - Abnormal; Notable for the following:    Glucose, Bld 101 (*)    Total Bilirubin 0.1 (*)    GFR calc non Af Amer 81 (*)    All other components within normal limits  ACETAMINOPHEN LEVEL - Abnormal; Notable for the following:    Acetaminophen (Tylenol), Serum <10.0 (*)    All other components within normal limits  CBG MONITORING, ED - Abnormal; Notable for the following:    Glucose-Capillary 122 (*)    All other components within normal limits  CBC  ETHANOL  SALICYLATE LEVEL  URINALYSIS, ROUTINE W REFLEX MICROSCOPIC  URINE RAPID DRUG SCREEN (HOSP PERFORMED)    Imaging Review Ct Head Wo Contrast  01/30/2015   CLINICAL DATA:  Altered mental status.  Nonverbal.  EXAM: CT HEAD WITHOUT CONTRAST  TECHNIQUE: Contiguous axial images were obtained from the base of the skull through the vertex without intravenous contrast.  COMPARISON:  None.  FINDINGS: The gray-white differentiation is maintained. No CT evidence of acute large territory infarct. No intraparenchymal or extra-axial mass or hemorrhage. Normal size and configuration of the ventricles and basilar cisterns. No midline shift. There is under pneumatization of the right frontal sinus. There is polypoid mucosal thickening of the bilateral maxillary sinuses, right greater than left. The remaining paranasal sinuses and mastoid air cells are normally aerated. No air-fluid levels. Regional soft tissues appear normal. No displaced calvarial fracture.  IMPRESSION: 1. Negative noncontrast head CT. 2. Polypoid sinus disease involving the bilateral maxillary sinuses, right greater than left. No air-fluid levels.   Electronically Signed   By: Sandi Mariscal M.D.   On: 01/30/2015 21:37     EKG Interpretation   Date/Time:  Thursday January 30 2015 20:52:40 EDT Ventricular Rate:  95 PR Interval:  145 QRS  Duration: 80 QT Interval:  356 QTC Calculation: 447 R Axis:   59 Text Interpretation:  Sinus rhythm Low voltage, precordial leads Sinus  rhythm Low voltage QRS Artifact Borderline ECG Confirmed by Carmin Muskrat  MD ([id number]) on 01/30/2015 9:46:51 PM     10:27 PM Patient in NAD, now communicating with more clarite w nodding, eye motion. She denies taking anything other than benadryl.  She is thirsty.  11:41 PM Patient sitting upright, in no distress, answering questions appropriately, speaking clearly. Patient seems to have returned to baseline soon after her daughter arrived. Patient denies any ongoing concerns. She confirms that she did not take any medication today, is unclear about what happened. She states that she has had similar events multiple times in the past. Had a very lengthy conversation with the patient, her daughter, her husband about the need for further evaluation, of her episodes, with her primary care team, and with pulmonology, as scheduled in 2 weeks.  MDM   Final diagnoses:  Altered mental status  Patient presents after an episode of altered mental status. Initially the patient is awake, alert, but not following commands consistently. Patient eventually returned to baseline, sitting up, speaking, laughing, smiling. No evidence for intoxication, substantial loculated abnormalities, coronary ischemia, and the patient had unremarkable stress test yesterday according to her. There is some suspicion for psychosomatic pathology, though no clear stress sources are elucidated with family during conversation. Given the patient's return to baseline, her stable vitals, labs, she was discharged in stable condition to follow-up with her physicians.    Carmin Muskrat, MD 01/30/15 [phone]

## 2015-01-30 NOTE — ED Notes (Signed)
Per pt's spouse at bedside, pt was found at home in a catatonic-like state, pt was last seen normal yesterday evening, pt appears to be attempting to follow commands however continues to star straight ahead, pt in no acute distress, husband tearful about pt's current state. Emotional support given. Pt otherwise neurologically intact.

## 2015-01-30 NOTE — ED Notes (Signed)
Pt reports taking benadryl at 0800 this am for allergies.  Pt reports she has been feeling tired all day.  + altered mental status.  Pt unable to follow commands at this time.  Pt is alert but is not oriented.  Pt denies any pain at this time.  Pt stares at the ceiling.  Pt ambulatory without difficulty at this time.

## 2015-01-30 NOTE — Discharge Instructions (Signed)
As discussed, your evaluation today has been largely reassuring.  But, it is important that you monitor your condition carefully, and do not hesitate to return to the ED if you develop new, or concerning changes in your condition. ° °Otherwise, please follow-up with your physicians for appropriate ongoing care. ° °

## 2015-01-31 NOTE — ED Notes (Signed)
Pt up - A&Ox4, speaking complete sentences w/o difficulty. Pt verbalized understanding for follow up - pt's daughter and spouse at bedside.

## 2015-02-04 ENCOUNTER — Telehealth: Payer: Self-pay | Admitting: Internal Medicine

## 2015-02-04 ENCOUNTER — Ambulatory Visit: Payer: 59 | Attending: Internal Medicine | Admitting: Internal Medicine

## 2015-02-04 ENCOUNTER — Telehealth: Payer: Self-pay | Admitting: Cardiology

## 2015-02-04 ENCOUNTER — Encounter: Payer: Self-pay | Admitting: Internal Medicine

## 2015-02-04 VITALS — BP 133/75 | HR 112 | Temp 98.0°F | Resp 95 | Ht 66.0 in | Wt 211.0 lb

## 2015-02-04 DIAGNOSIS — F329 Major depressive disorder, single episode, unspecified: Secondary | ICD-10-CM | POA: Diagnosis not present

## 2015-02-04 DIAGNOSIS — F32A Depression, unspecified: Secondary | ICD-10-CM

## 2015-02-04 DIAGNOSIS — R4182 Altered mental status, unspecified: Secondary | ICD-10-CM

## 2015-02-04 NOTE — Telephone Encounter (Signed)
Please let patient know that heart monitor showed NSR with PVC's. HR ranged from 69 to 120bpm.  PVC's are benign with normal LVF on echo and no ischemia on ETT

## 2015-02-04 NOTE — Progress Notes (Signed)
ED F/U w/ Altered Mental State. Pt is still staring off in the distance. She feels as though she can go catatonic at anytime. Pt states that she feels strange.

## 2015-02-04 NOTE — Progress Notes (Signed)
Patient ID: Lori Owens, female   DOB: 09-Jan-1961, 54 y.o.   MRN: [id number]  CC: ED follow up   HPI: Lori Owens is a 54 y.o. female here today for a follow up visit.  Patient has past medical history of depression, tobacco use, COPD, heart murmur. Patient was seen in the ER on 01/30/15 for altered mental status. Patient reports that she has had 2 events since hospital discharge. She reports that she has symptoms of staring off in space and feeling withdrawn. She states that she hears people talking to her but feels like it is hard for her to answer back.   During exam patient takes several minutes to answer question. She reports a family history of suicide (father), depression (patient and mother), bipolar (brother). She states that she has never been tested for any psychiatric disorder. She states that she constantly feels on edge for the past 2-3 months. She feels that her paxil does not work as well as before (been on 20 years). She does feels worthless. She has had difficulty with sleep for several years but it has become progressively worse over the past 2 years. She sleeps around six hours per night but feels it takes her longer to get to sleep. She has been taking Melatonin at night 2 hours before bed.    Patient has No headache, No chest pain, No abdominal pain - No Nausea, No new weakness tingling or numbness, No Cough - SOB.  No Known Allergies Past Medical History  Diagnosis Date  . Depression 01/18/2013  . Tobacco abuse 01/18/2013  . Headache(784.0)   . COPD (chronic obstructive pulmonary disease)   . Heart murmur   . Melanoma    Current Outpatient Prescriptions on File Prior to Visit  Medication Sig Dispense Refill  . albuterol (PROVENTIL HFA;VENTOLIN HFA) 108 (90 BASE) MCG/ACT inhaler Inhale 2 puffs into the lungs every 6 (six) hours as needed for wheezing or shortness of breath. 6.7 g 1  . albuterol (PROVENTIL) (5 MG/ML) 0.5% nebulizer solution Take 0.5 mLs (2.5 mg total) by  nebulization every 6 (six) hours as needed for wheezing or shortness of breath (COPD). 20 mL 3  . Fluticasone-Salmeterol (ADVAIR DISKUS) 250-50 MCG/DOSE AEPB Inhale 1 puff into the lungs 2 (two) times daily. 1 each 3  . metoprolol tartrate (LOPRESSOR) 25 MG tablet TAKE ONE TABLET BY MOUTH TWICE DAILY 60 tablet 1  . PARoxetine (PAXIL) 40 MG tablet Take 40 mg by mouth every morning.    . Melatonin 10 MG TABS Take 1 tablet by mouth at bedtime as needed (sleep).    . Probiotic Product (PROBIOTIC PO) Take 1 tablet by mouth daily.      No current facility-administered medications on file prior to visit.   Family History  Problem Relation Age of Onset  . Diabetes Maternal Grandfather   . Heart disease Maternal Grandfather   . Cancer Maternal Grandfather   . Hypertension Maternal Grandfather    History   Social History  . Marital Status: Single    Spouse Name: N/A  . Number of Children: N/A  . Years of Education: N/A   Occupational History  . Not on file.   Social History Main Topics  . Smoking status: Current Every Day Smoker -- 0.50 packs/day    Last Attempt to Quit: 01/18/2013  . Smokeless tobacco: Never Used  . Alcohol Use: No  . Drug Use: No  . Sexual Activity: No   Other Topics Concern  . Not on  file   Social History Narrative   ** Merged History Encounter **        Review of Systems  Cardiovascular: Negative.   Neurological: Positive for tingling (hands). Negative for dizziness.  Psychiatric/Behavioral: Positive for depression and memory loss (often). Negative for substance abuse.  All other systems reviewed and are negative.     Objective:   Filed Vitals:   02/04/15 1525  BP: 133/75  Pulse: 112  Temp: 98 F (36.7 C)  Resp: 95    Physical Exam  Constitutional: She is oriented to person, place, and time.  Cardiovascular: Normal rate.   Pulmonary/Chest: Effort normal and breath sounds normal.  Neurological: She is alert and oriented to person, place,  and time.  Skin: Skin is warm and dry.  Psychiatric:  Patient is withdrawn and stares at wall and ceiling during whole interview. No eye contact Takes patient several minutes to answer questions     Lab Results  Component Value Date   WBC 7.4 01/30/2015   HGB 13.2 01/30/2015   HCT 40.5 01/30/2015   MCV 94.2 01/30/2015   PLT 280 01/30/2015   Lab Results  Component Value Date   CREATININE 0.81 01/30/2015   BUN 17 01/30/2015   NA 142 01/30/2015   K 4.1 01/30/2015   CL 107 01/30/2015   CO2 26 01/30/2015    Lab Results  Component Value Date   HGBA1C 5.6 06/06/2014   Lipid Panel     Component Value Date/Time   CHOL 177 10/16/2014 0924   TRIG 176* 10/16/2014 0924   HDL 41 10/16/2014 0924   CHOLHDL 4.3 10/16/2014 0924   VLDL 35 10/16/2014 0924   LDLCALC 101* 10/16/2014 0924       Assessment and plan:   [name] was seen today for follow-up.  Diagnoses and all orders for this visit:  Altered mental status, unspecified altered mental status type Orders: -     Ambulatory referral to Psychiatry It is in my opinion that patient has nothing wrong neurologically. I believe she will benefit most from a psychiatry referral. It may help to switch patient off paxil.   Depression Patient reports insomnia but states that she gets 6 hours of sleep per night. Explained that she may take her Melatonin a little earlier to help her to get to sleep.  Before switching patient off paxil I believe she should seek psychiatry referral to make sure she has the correct diagnoses and medication management.  Explained signs and symptoms that should warrant immediate attention.  Patient verbalized understanding with teach back used.   Return if symptoms worsen or fail to improve.      Lori Manning, NP-C [facility] [phone] 02/04/2015, 3:59 PM

## 2015-02-04 NOTE — Telephone Encounter (Signed)
[facility] has called to get insurance verification; please f/u with Eleva @ [phone] ext. [phone]

## 2015-02-05 ENCOUNTER — Telehealth: Payer: Self-pay

## 2015-02-05 ENCOUNTER — Encounter: Payer: Self-pay | Admitting: Cardiology

## 2015-02-05 NOTE — Telephone Encounter (Signed)
Informed patient of results and verbal understanding expressed.  

## 2015-02-05 NOTE — Telephone Encounter (Signed)
Follow Up  Pt returning Rn's ohone call. Please call back and discuss.

## 2015-02-05 NOTE — Telephone Encounter (Signed)
Call placed to patient to determine if patient taking Chantix. Prescribed on 09/19/14 by Chari Manning, NP.  Prior authorization request form received from [place] for Chantix. Medication no longer listed on med list. Discussed with Chari Manning, NP who indicates if patient not taking Chantix; patient could be prescribed Nicotene patches-14 mg.  Call placed to patient's mobile number. Number no longer in service.  In addition, call placed to patient's listed home number; patient not home. Message left requesting return call.

## 2015-02-05 NOTE — Telephone Encounter (Signed)
This encounter was created in error - please disregard.

## 2015-02-25 ENCOUNTER — Institutional Professional Consult (permissible substitution): Payer: 59 | Admitting: Internal Medicine

## 2015-02-27 ENCOUNTER — Ambulatory Visit (HOSPITAL_BASED_OUTPATIENT_CLINIC_OR_DEPARTMENT_OTHER): Payer: 59 | Attending: Cardiology

## 2015-02-27 VITALS — Ht 66.0 in | Wt 210.0 lb

## 2015-02-27 DIAGNOSIS — G4710 Hypersomnia, unspecified: Secondary | ICD-10-CM | POA: Insufficient documentation

## 2015-02-27 DIAGNOSIS — R0683 Snoring: Secondary | ICD-10-CM | POA: Diagnosis not present

## 2015-02-27 DIAGNOSIS — G4719 Other hypersomnia: Secondary | ICD-10-CM

## 2015-02-27 DIAGNOSIS — M25521 Pain in right elbow: Secondary | ICD-10-CM

## 2015-03-03 ENCOUNTER — Telehealth: Payer: Self-pay | Admitting: Clinical

## 2015-03-03 ENCOUNTER — Telehealth: Payer: Self-pay | Admitting: Cardiology

## 2015-03-03 DIAGNOSIS — G4719 Other hypersomnia: Secondary | ICD-10-CM | POA: Insufficient documentation

## 2015-03-03 NOTE — Telephone Encounter (Signed)
First attempt to contact pt regarding referral

## 2015-03-03 NOTE — Sleep Study (Signed)
   NAME: Lori Owens DATE OF BIRTH:  December 17, 1960 MEDICAL RECORD NUMBER [id number]  LOCATION: [facility]  PHYSICIAN: Deng Kemler R  DATE OF STUDY: 02/27/2015  SLEEP STUDY TYPE: Nocturnal Polysomnogram               REFERRING PHYSICIAN: Sueanne Margarita, MD  INDICATION FOR STUDY: snoring and excessive daytime sleepiness  EPWORTH SLEEPINESS SCORE: 3 HEIGHT: 5\' 6"  (167.6 cm)  WEIGHT: 210 lb (95.255 kg)    Body mass index is 33.91 kg/(m^2).  NECK SIZE: 15 in.  MEDICATIONS: Reviewed in the chart  SLEEP ARCHITECTURE: The patient slept for a total of 370 minutes out of a total sleep time of 392 minutes.  There was no slow wave or REM sleep.  The onset to sleep latency was normal at 10 minutes.  The sleep efficiency was normal at 92%.    RESPIRATORY DATA: There were 4 obstructive apneas and 4 hypopneas which occurred mainly in the non supine position and during NREM sleep.  The AHI was 1.3 events per hour consistent with normal sleep breathing.  There was moderate snoring.  OXYGEN DATA: The average oxygen saturation was 92% and the lowest oxygen saturation was 89%.  CARDIAC DATA: The patient maintained NSR with PAC's.  The average heart rate was 74 bpm and the lowest heart rate was 35 bpm.    MOVEMENT/PARASOMNIA: There were no periodic limb movement disorders and no REM sleep behavior disorders noted.    IMPRESSION/ RECOMMENDATION:   1.  No evidence of sleep disordered breathing.   2.  Normal sleep efficiency. 3.  Abnormal sleep architecture with no slow wave or REM sleep.   4.  No significant oxygen desaturations.    5.  PAC's were noted during the study. 6.  The patient should be counseled in good sleep hygiene. 7.  Consider referral to ENT for evaluation of surgical causes of snoring.  Signed:  Sueanne Margarita Diplomate, American Board of Sleep Medicine  ELECTRONICALLY SIGNED ON:  03/03/2015, 5:33 PM [place] PH: [phone]    FX: [phone] [place]

## 2015-03-03 NOTE — Telephone Encounter (Signed)
Please let patient know that sleep study was normal although she did have severe moderate snoring.  Patient should followup with PCP and consider ENT evaluation for snoring.

## 2015-03-04 ENCOUNTER — Institutional Professional Consult (permissible substitution): Payer: 59 | Admitting: Internal Medicine

## 2015-03-06 NOTE — Telephone Encounter (Signed)
Patient is aware of results and stated verbal understanding. Lori Owens requested an appointment with Dr Radford Pax because Lori Owens said there are a few things that Lori Owens wants to discuss with her.   Made the appointment for 05/07/15

## 2015-03-10 ENCOUNTER — Telehealth: Payer: Self-pay | Admitting: Clinical

## 2015-03-10 NOTE — Telephone Encounter (Signed)
Second attempt to F/U with Pt, left message to call Roselyn Reef at [phone]

## 2015-03-10 NOTE — Telephone Encounter (Signed)
Please find out what questions she has - may be able to address over the phone

## 2015-03-12 ENCOUNTER — Telehealth: Payer: Self-pay | Admitting: Cardiology

## 2015-03-12 NOTE — Telephone Encounter (Signed)
Follow Up  Pt returning Bethany's phone call. Please call back and discuss.

## 2015-03-12 NOTE — Telephone Encounter (Signed)
Left message for her to call me back regarding questions

## 2015-03-13 NOTE — Telephone Encounter (Signed)
Please see note below.  I cannot find any diagnosis of CHF or DCM by any MD in hospital so not sure where she got that diagnosis from.  SHe has a normal BNP, cardiac enzymes, 2D echo and stress test.  Recommend pulmonary eval if still having SOB.  No cardiac indication for disability

## 2015-03-13 NOTE — Telephone Encounter (Signed)
Pt stated that she is confused about the diagnosis that she was given in the hospital, and then all the tests are coming back clear. She stated that she is waiting on her disability paperwork and she doesn't know a diagnosis because she is confused that she is still having issues even though the tests came back clean.   She is questioning if she has cardiomyopathy and/or heart failure. She is fine with a phone call if you do not feel like an office visit is necessary she would just like some explanation of her current medical conditions.

## 2015-03-13 NOTE — Telephone Encounter (Signed)
See previous phone note.  

## 2015-03-14 NOTE — Telephone Encounter (Signed)
Left message to call back  

## 2015-03-21 ENCOUNTER — Ambulatory Visit (INDEPENDENT_AMBULATORY_CARE_PROVIDER_SITE_OTHER): Payer: 59 | Admitting: Internal Medicine

## 2015-03-21 ENCOUNTER — Encounter: Payer: Self-pay | Admitting: Internal Medicine

## 2015-03-21 VITALS — BP 138/88 | HR 88 | Ht 66.0 in | Wt 209.0 lb

## 2015-03-21 DIAGNOSIS — J9601 Acute respiratory failure with hypoxia: Secondary | ICD-10-CM | POA: Diagnosis not present

## 2015-03-21 DIAGNOSIS — F17210 Nicotine dependence, cigarettes, uncomplicated: Secondary | ICD-10-CM

## 2015-03-21 DIAGNOSIS — Z720 Tobacco use: Secondary | ICD-10-CM

## 2015-03-21 DIAGNOSIS — R0602 Shortness of breath: Secondary | ICD-10-CM

## 2015-03-21 DIAGNOSIS — J449 Chronic obstructive pulmonary disease, unspecified: Secondary | ICD-10-CM | POA: Diagnosis not present

## 2015-03-21 MED ORDER — BUDESONIDE-FORMOTEROL FUMARATE 160-4.5 MCG/ACT IN AERO: INHALATION_SPRAY | RESPIRATORY_TRACT | Status: DC

## 2015-03-21 NOTE — Progress Notes (Signed)
   Subjective:    Patient ID: Lori Owens, female    DOB: [birth year],     MRN: [id number]  HPI  40 yowf active smoker reports dx of copd 1995 ? By [facility] pulmonary doc c/o  variable sob since then self referred to pulmonary clinic 03/21/2015    03/21/2015 1st [facility] office visit/ Sangeeta Youse   Chief Complaint  Patient presents with  . Pulmonary Consult    Referred by Dr. Ernie Hew for COPD. Pt c/o increased SOB, wheezing, chest tightness, dry cough in early mornings. Taking singular for chest congestion and seems to be helping.   doe variable x 20 years on best days eg p neb treatment c/o doe x across parking at a normal pace and has been off and on adviar x 3 months and avg alb once a day and rarely needs nocturally And does ok in am s typical cough/congestion waking her   - hot weather makes breathing worse,  singulair better.  No obvious other patterns in  day to day or daytime variabilty or assoc  cp or  overt sinus or hb symptoms. No unusual exp hx or h/o childhood pna/ asthma or knowledge of premature birth.  Sleeping ok without nocturnal  or early am exacerbation  of respiratory  c/o's or need for noct saba. Also denies any obvious fluctuation of symptoms with weather or environmental changes or other aggravating or alleviating factors except as outlined above   Current Medications, Allergies, Complete Past Medical History, Past Surgical History, Family History, and Social History were reviewed in [employer] record.        Review of Systems  Constitutional: Negative for fever and unexpected weight change.  HENT: Negative for congestion, dental problem, ear pain, mouth sores, nosebleeds, postnasal drip, rhinorrhea, sinus pressure, sneezing, sore throat and trouble swallowing.   Eyes: Negative for redness and itching.  Respiratory: Positive for cough, chest tightness, shortness of breath and wheezing.   Cardiovascular: Positive for palpitations and  leg swelling.  Gastrointestinal: Negative for vomiting and diarrhea.  Genitourinary: Negative for dysuria.  Musculoskeletal: Negative for joint swelling.  Neurological: Negative for headaches.  Hematological: Does not bruise/bleed easily.  Psychiatric/Behavioral: Negative for dysphoric mood.       Objective:   Physical Exam  amb wf nad  Wt Readings from Last 3 Encounters:  03/21/15 209 lb (94.802 kg)  02/27/15 210 lb (95.255 kg)  02/04/15 211 lb (95.709 kg)    Vital signs reviewed  HEENT: nl dentition, turbinates, and orophanx. Nl external ear canals without cough reflex   NECK :  without JVD/Nodes/TM/ nl carotid upstrokes bilaterally   LUNGS: no acc muscle use, clear to A and P bilaterally without cough on insp or exp maneuvers   CV:  RRR  no s3 or murmur or increase in P2, no edema   ABD:  soft and nontender with nl excursion in the supine position. No bruits or organomegaly, bowel sounds nl  MS:  warm without deformities, calf tenderness, cyanosis or clubbing  SKIN: warm and dry without lesions    NEURO:  alert, approp, no deficits    I personally reviewed images and agree with radiology impression as follows:  CXR:  11/06/14 No acute pulmonary process.   CTa 10/04/14 1. No evidence of pulmonary embolus. 2. Lungs clear bilaterally          Assessment & Plan:

## 2015-03-21 NOTE — Patient Instructions (Signed)
Plan A =  Automatic = symbicort 160 Take 2 puffs first thing in am and then another 2 puffs about 12 hours later.   Plan B =  Backup - Only use your albuterol as a rescue medication to be used if you can't catch your breath by resting or doing a relaxed purse lip breathing pattern.  - The less you use it, the better it will work when you need it. - Ok to use up to 2 puffs  every 4 hours if you must but call for immediate appointment if use goes up over your usual need - Don't leave home without it !!  (think of it like the spare tire for your car)   Plan C = Crisis  - always try Plan B first if you must up 4 hours but call immediately to be seen   The key is to stop smoking completely before smoking completely stops you!   Please schedule a follow up office visit in 6 weeks, call sooner if needed with pfts on return

## 2015-03-22 ENCOUNTER — Encounter: Payer: Self-pay | Admitting: Internal Medicine

## 2015-03-22 NOTE — Assessment & Plan Note (Addendum)
-   03/21/2015 p extensive coaching HFA effectiveness =    75% try symbicort 160 2bid  - 03/21/2015  Walked RA x 3 laps @ 185 ft each stopped due to  End of study, no sob no desats   Symptoms are markedly disproportionate to objective findings and not clear this is all  a lung problem but pt does appear to have difficult airway management issues.    Adherence is always the initial "prime suspect" and is a multilayered concern that requires a "trust but verify" approach in every patient - starting with knowing how to use medications, especially inhalers, correctly, keeping up with refills and understanding the fundamental difference between maintenance and prns vs those medications only taken for a very short course and then stopped and not refilled.  - really did not understand concept of maint vs prns and how to use the different forms of saba - 03/22/2015 p extensive coaching HFA effectiveness =    75% so try symbicort 160 2bid  Active smoking obviously top of the list of other usual suspects > see sep a/p   ? Allergies > symptoms better on singulair >continue  ? Adverse effects of dry powdered inhalers ie advair > doubt as wasn't really using it appropriately anyway so rec d/c and replace with hfa symbicort  ? Anxiety/ depression/ deconditioning also on the list but dx of exclusion   Will need to  return for full pfts and reassess at that point   Each maintenance medication was reviewed in detail including most importantly the difference between maintenance and as needed and under what circumstances the prns are to be used.  Please see instructions for details which were reviewed in writing and the patient given a copy.

## 2015-03-22 NOTE — Assessment & Plan Note (Signed)
>   3 min discussion I reviewed the Flethcher curve with patient that basically indicates  if you quit smoking when your best day FEV1 is still well preserved (as is clearly  the case here)  it is highly unlikely you will progress to severe disease and informed the patient there was no medication on the market that has proven to change the curve or the likelihood of progression.  Therefore stopping smoking and maintaining abstinence is the most important aspect of care, not choice of inhalers or for that matter, doctors.   

## 2015-03-22 NOTE — Assessment & Plan Note (Signed)
See copd/ will sort out when returns for pfts

## 2015-03-25 NOTE — Telephone Encounter (Signed)
Left Message to call back.

## 2015-03-26 ENCOUNTER — Telehealth: Payer: Self-pay | Admitting: Cardiology

## 2015-03-26 NOTE — Telephone Encounter (Signed)
See phone note below.  

## 2015-03-26 NOTE — Telephone Encounter (Signed)
TO BE ADDED TO MY NOTE BELOW: Pt stated that she did not understand why the hospital gave her paperwork for CHF and DCM if she did not have it. She stated "This has ruined my life and I guess I will just have to call my attorney."

## 2015-03-26 NOTE — Telephone Encounter (Signed)
Spoke with Lori Owens to give her the information from Dr. Radford Pax' note. Pt insists that she was given CHF and DCM diagnosis, despite my telling her what Dr. Radford Pax said.    Katy K. Is aware of the situation and said she would talk to Dr. Radford Pax.

## 2015-03-26 NOTE — Telephone Encounter (Signed)
Follow up  Pt returning Bethany's phone call. Please call back and discuss.

## 2015-03-27 NOTE — Telephone Encounter (Signed)
Called patient to offer her earlier appointment with Dr. Radford Pax for clarification and guidance. Patient refuses at this time. She says she does not blame Dr. Radford Pax for what happened at the hospital. She said the whole experience "turned her whole life upside down" and she just wants to forget about the whole experience.  She said she stopped working, filed for disability, and is "now broke."  Offered the patient to have the Office of Patient Experience give her a call and she declined. Patient st she will keep her 6/29 OV with Dr. Radford Pax.   Patient appreciates the call and says she just wants to forget the situation ever happened.

## 2015-05-02 ENCOUNTER — Encounter: Payer: Self-pay | Admitting: Internal Medicine

## 2015-05-02 ENCOUNTER — Ambulatory Visit (INDEPENDENT_AMBULATORY_CARE_PROVIDER_SITE_OTHER): Payer: 59 | Admitting: Internal Medicine

## 2015-05-02 VITALS — BP 114/80 | HR 83 | Ht 66.0 in | Wt 211.0 lb

## 2015-05-02 DIAGNOSIS — E669 Obesity, unspecified: Secondary | ICD-10-CM | POA: Diagnosis not present

## 2015-05-02 DIAGNOSIS — R0602 Shortness of breath: Secondary | ICD-10-CM | POA: Diagnosis not present

## 2015-05-02 DIAGNOSIS — F17210 Nicotine dependence, cigarettes, uncomplicated: Secondary | ICD-10-CM

## 2015-05-02 DIAGNOSIS — Z720 Tobacco use: Secondary | ICD-10-CM

## 2015-05-02 DIAGNOSIS — J4530 Mild persistent asthma, uncomplicated: Secondary | ICD-10-CM | POA: Diagnosis not present

## 2015-05-02 LAB — PULMONARY FUNCTION TEST
DL/VA % PRED: 87 %
DL/VA: 4.41 ml/min/mmHg/L
DLCO UNC % PRED: 85 %
DLCO UNC: 23.06 ml/min/mmHg
FEF 25-75 Post: 3.08 L/sec
FEF 25-75 Pre: 1.13 L/sec
FEF2575-%CHANGE-POST: 172 %
FEF2575-%PRED-POST: 112 %
FEF2575-%PRED-PRE: 41 %
FEV1-%CHANGE-POST: 42 %
FEV1-%PRED-POST: 82 %
FEV1-%PRED-PRE: 58 %
FEV1-POST: 2.42 L
FEV1-Pre: 1.7 L
FEV1FVC-%CHANGE-POST: 9 %
FEV1FVC-%Pred-Pre: 86 %
FEV6-%Change-Post: 30 %
FEV6-%Pred-Post: 89 %
FEV6-%Pred-Pre: 68 %
FEV6-Post: 3.24 L
FEV6-Pre: 2.48 L
FEV6FVC-%PRED-PRE: 103 %
FEV6FVC-%Pred-Post: 103 %
FVC-%Change-Post: 30 %
FVC-%PRED-PRE: 66 %
FVC-%Pred-Post: 87 %
FVC-PRE: 2.48 L
FVC-Post: 3.24 L
POST FEV1/FVC RATIO: 75 %
Post FEV6/FVC ratio: 100 %
Pre FEV1/FVC ratio: 69 %
Pre FEV6/FVC Ratio: 100 %
RV % pred: 203 %
RV: 3.97 L
TLC % pred: 124 %
TLC: 6.64 L

## 2015-05-02 MED ORDER — BUDESONIDE-FORMOTEROL FUMARATE 160-4.5 MCG/ACT IN AERO: INHALATION_SPRAY | RESPIRATORY_TRACT | Status: DC

## 2015-05-02 MED ORDER — VARENICLINE TARTRATE 0.5 MG X 11 & 1 MG X 42 PO MISC: ORAL | Status: DC

## 2015-05-02 NOTE — Patient Instructions (Signed)
Plan A =  Automatic = symbicort 160 Take 2 puffs first thing in am and then another 2 puffs about 12 hours later.   Plan B =  Backup - Only use your albuterol as a rescue medication to be used if you can't catch your breath by resting or doing a relaxed purse lip breathing pattern.  - The less you use it, the better it will work when you need it. - Ok to use up to 2 puffs  every 4 hours if you must but call for immediate appointment if use goes up over your usual need - Don't leave home without it !!  (think of it like the spare tire for your car)   Plan C = Crisis  - always try Plan B first if you must up 4 hours but call immediately to be seen   The key is to stop smoking completely before smoking completely stops you!   chantix starter pack > follow up Dr Ernie Hew    If you are satisfied with your treatment plan,  let your doctor know and he/she can either refill your medications or you can return here when your prescription runs out.     If in any way you are not 100% satisfied,  please tell us.  If 100% better, tell your friends!  Pulmonary follow up is as needed

## 2015-05-02 NOTE — Progress Notes (Signed)
PFT performed today. 

## 2015-05-02 NOTE — Progress Notes (Signed)
Subjective:    Patient ID: Lori Owens, female    DOB: 1961/04/20,     MRN: [id number]   Brief patient profile:  48 yowf active smoker reports dx of copd 1995 ? By [facility] pulmonary doc c/o  variable sob since then self referred to pulmonary clinic 03/21/2015 with copmletely nl pfts p saba documented 05/02/15     History of Present Illness  03/21/2015 1st [facility] office visit/ [name]   Chief Complaint  Patient presents with  . Pulmonary Consult    Referred by Dr. Ernie Hew for COPD. Pt c/o increased SOB, wheezing, chest tightness, dry cough in early mornings. Taking singular for chest congestion and seems to be helping.   doe variable x 20 years on best days eg p neb treatment c/o doe x across parking at a normal pace and has been off and on adviar x 3 months and avg alb once a day and rarely needs nocturally And does ok in am s typical cough/congestion waking her   - hot weather makes breathing worse,  singulair better. rec Plan A =  Automatic = symbicort 160 Take 2 puffs first thing in am and then another 2 puffs about 12 hours later.  Plan B =  Backup - Only use your albuterol as a rescue medication  Plan C = Crisis  - always try Plan B first if you must up 4 hours but call immediately to be seen  The key is to stop smoking completely before smoking completely stops you!    05/02/2015 f/u ov/[name] re: mild persistent asthma/ not copd/ still smoking  Chief Complaint  Patient presents with  . Follow-up    PFT done today.  Pt states chest tightness and SOB are some better. Her cough is unchanged. No new co's today. She has used albuterol inhaler 1 x in the past wk and has not needed neb recently.    stopped symb > back to advair last used 16 h prior to OV    No obvious day to day or daytime variabilty or assoc chronic cough or cp or chest tightness, subjective wheeze overt sinus or hb symptoms. No unusual exp hx or h/o childhood pna/ asthma or knowledge of premature  birth.  Sleeping ok without nocturnal  or early am exacerbation  of respiratory  c/o's or need for noct saba. Also denies any obvious fluctuation of symptoms with weather or environmental changes or other aggravating or alleviating factors except as outlined above   Current Medications, Allergies, Complete Past Medical History, Past Surgical History, Family History, and Social History were reviewed in [employer] record.  ROS  The following are not active complaints unless bolded sore throat, dysphagia, dental problems, itching, sneezing,  nasal congestion or excess/ purulent secretions, ear ache,   fever, chills, sweats, unintended wt loss, pleuritic or exertional cp, hemoptysis,  orthopnea pnd or leg swelling, presyncope, palpitations, abdominal pain, anorexia, nausea, vomiting, diarrhea  or change in bowel or urinary habits, change in stools or urine, dysuria,hematuria,  rash, arthralgias, visual complaints, headache, numbness weakness or ataxia or problems with walking or coordination,  change in mood/affect or memory.              Objective:   Physical Exam  amb wf nad  05/02/2015        211  Wt Readings from Last 3 Encounters:  03/21/15 209 lb (94.802 kg)  02/27/15 210 lb (95.255 kg)  02/04/15 211 lb (95.709 kg)  Vital signs reviewed  HEENT: nl dentition, turbinates, and orophanx. Nl external ear canals without cough reflex   NECK :  without JVD/Nodes/TM/ nl carotid upstrokes bilaterally   LUNGS: no acc muscle use, clear to A and P bilaterally without cough on insp or exp maneuvers   CV:  RRR  no s3 or murmur or increase in P2, no edema   ABD:  soft and nontender with nl excursion in the supine position. No bruits or organomegaly, bowel sounds nl  MS:  warm without deformities, calf tenderness, cyanosis or clubbing  SKIN: warm and dry without lesions    NEURO:  alert, approp, no deficits    I personally reviewed images and agree with  radiology impression as follows:  CXR:  11/06/14 No acute pulmonary process.   CTa 10/04/14 1. No evidence of pulmonary embolus. 2. Lungs clear bilaterally          Assessment & Plan:

## 2015-05-03 ENCOUNTER — Encounter: Payer: Self-pay | Admitting: Internal Medicine

## 2015-05-03 DIAGNOSIS — E669 Obesity, unspecified: Secondary | ICD-10-CM | POA: Insufficient documentation

## 2015-05-03 NOTE — Assessment & Plan Note (Addendum)
Body mass index is 34.07 kg/(m^2).  Lab Results  Component Value Date   TSH 0.590 09/05/2014     Contributing  Lori Owens explaining why she's still c/o Lori Owens a completely reversed airflow obst >>needs to achieve and maintain neg calorie balance >f/u primary care

## 2015-05-03 NOTE — Assessment & Plan Note (Signed)
-   03/21/2015  Walked RA x 3 laps @ 185 ft each stopped due to  End of study, no sob no desats  - PFTs 05/02/2015 FEV1  2.42 (82%) ratio 75 p 42% better p saba and  85% s advair that am  -  05/02/2015 p extensive coaching HFA effectiveness =    90% > rechallenge with symbicort 160 2bid    I had an extended discussion with the patient reviewing all relevant studies completed to date and  lasting 15 to 20 minutes of a 25 minute visit on the following ongoing concerns:  1) this is clearly asthma/ not copd  2) even if this is "early copd" (eg GOLD 0)  I reviewed the Fletcher curve with the patient that basically indicates  if you quit smoking when your best day FEV1 is still well preserved (as is clearly  the case here)  it is highly unlikely you will progress to severe disease and informed the patient there was no medication on the market that has proven to alter the curve/ its downward trajectory  or the likelihood of progression of their disease.  Therefore stopping smoking and maintaining abstinence is the most important aspect of care, not choice of inhalers or for that matter, doctors.    3) best option here is symb 160 2bid  4) The proper method of use, as well as anticipated side effects, of a metered-dose inhaler are discussed and demonstrated to the patient. Improved effectiveness after extensive coaching during this visit to a level of approximately  90%  5) Each maintenance medication was reviewed in detail including most importantly the difference between maintenance and as needed and under what circumstances the prns are to be used.  Please see instructions for details which were reviewed in writing and the patient given a copy.    6) pulmonary f/u can be prn

## 2015-05-03 NOTE — Assessment & Plan Note (Signed)
Discussed impt of quiting > see asthma a/p

## 2015-05-06 ENCOUNTER — Encounter (HOSPITAL_COMMUNITY): Payer: Self-pay

## 2015-05-06 ENCOUNTER — Telehealth: Payer: Self-pay | Admitting: *Deleted

## 2015-05-06 ENCOUNTER — Emergency Department (HOSPITAL_COMMUNITY)
Admission: EM | Admit: 2015-05-06 | Discharge: 2015-05-07 | Disposition: A | Discharge status: Other Acute Inpatient Hospital | Payer: 59 | Attending: Emergency Medicine | Admitting: Emergency Medicine

## 2015-05-06 DIAGNOSIS — F329 Major depressive disorder, single episode, unspecified: Secondary | ICD-10-CM | POA: Insufficient documentation

## 2015-05-06 DIAGNOSIS — R011 Cardiac murmur, unspecified: Secondary | ICD-10-CM | POA: Diagnosis not present

## 2015-05-06 DIAGNOSIS — R45851 Suicidal ideations: Secondary | ICD-10-CM | POA: Diagnosis present

## 2015-05-06 DIAGNOSIS — J449 Chronic obstructive pulmonary disease, unspecified: Secondary | ICD-10-CM | POA: Diagnosis not present

## 2015-05-06 DIAGNOSIS — Z85820 Personal history of malignant melanoma of skin: Secondary | ICD-10-CM | POA: Insufficient documentation

## 2015-05-06 DIAGNOSIS — Z720 Tobacco use: Secondary | ICD-10-CM | POA: Diagnosis not present

## 2015-05-06 DIAGNOSIS — I10 Essential (primary) hypertension: Secondary | ICD-10-CM | POA: Diagnosis not present

## 2015-05-06 DIAGNOSIS — F1310 Sedative, hypnotic or anxiolytic abuse, uncomplicated: Secondary | ICD-10-CM | POA: Insufficient documentation

## 2015-05-06 DIAGNOSIS — R45850 Homicidal ideations: Secondary | ICD-10-CM

## 2015-05-06 LAB — COMPREHENSIVE METABOLIC PANEL
ALBUMIN: 4.2 g/dL (ref 3.5–5.0)
ALK PHOS: 88 U/L (ref 38–126)
ALT: 20 U/L (ref 14–54)
ANION GAP: 9 (ref 5–15)
AST: 23 U/L (ref 15–41)
BUN: 11 mg/dL (ref 6–20)
CHLORIDE: 106 mmol/L (ref 101–111)
CO2: 25 mmol/L (ref 22–32)
CREATININE: 0.67 mg/dL (ref 0.44–1.00)
Calcium: 9.7 mg/dL (ref 8.9–10.3)
GFR calc non Af Amer: >60 mL/min (ref >60)
Glucose, Bld: 94 mg/dL (ref 65–99)
Potassium: 3.8 mmol/L (ref 3.5–5.1)
Sodium: 140 mmol/L (ref 135–145)
Total Bilirubin: 0.5 mg/dL (ref 0.3–1.2)
Total Protein: 7.2 g/dL (ref 6.5–8.1)

## 2015-05-06 LAB — CBC
HEMATOCRIT: 40.7 % (ref 36.0–46.0)
HEMOGLOBIN: 13.4 g/dL (ref 12.0–15.0)
MCH: 30.1 pg (ref 26.0–34.0)
MCHC: 32.9 g/dL (ref 30.0–36.0)
MCV: 91.5 fL (ref 78.0–100.0)
PLATELETS: 272 10*3/uL (ref 150–400)
RBC: 4.45 MIL/uL (ref 3.87–5.11)
RDW: 13.1 % (ref 11.5–15.5)
WBC: 7.5 10*3/uL (ref 4.0–10.5)

## 2015-05-06 LAB — ACETAMINOPHEN LEVEL: Acetaminophen (Tylenol), Serum: <10 ug/mL — ABNORMAL LOW (ref 10–30)

## 2015-05-06 LAB — RAPID URINE DRUG SCREEN, HOSP PERFORMED
Amphetamines: NOT DETECTED
BARBITURATES: NOT DETECTED
Benzodiazepines: POSITIVE — AB
COCAINE: NOT DETECTED
Opiates: NOT DETECTED
Tetrahydrocannabinol: NOT DETECTED

## 2015-05-06 LAB — SALICYLATE LEVEL: Salicylate Lvl: <4 mg/dL (ref 2.8–30.0)

## 2015-05-06 LAB — ETHANOL: Alcohol, Ethyl (B): <5 mg/dL (ref <5)

## 2015-05-06 MED ORDER — MONTELUKAST SODIUM 10 MG PO TABS
10.0000 mg | ORAL_TABLET | Freq: Every day | ORAL | Status: DC
  Administered 2015-05-07: 10 mg via ORAL
  Filled 2015-05-06 (×2): qty 1

## 2015-05-06 MED ORDER — ONDANSETRON HCL 4 MG PO TABS
4.0000 mg | ORAL_TABLET | Freq: Three times a day (TID) | ORAL | Status: DC | PRN
Start: 2015-05-06 — End: 2015-05-07

## 2015-05-06 MED ORDER — ALPRAZOLAM 0.5 MG PO TABS
0.5000 mg | ORAL_TABLET | Freq: Every evening | ORAL | Status: DC | PRN
  Administered 2015-05-06: 0.5 mg via ORAL
  Filled 2015-05-06: qty 1

## 2015-05-06 MED ORDER — ALBUTEROL SULFATE HFA 108 (90 BASE) MCG/ACT IN AERS: 2.0000 | INHALATION_SPRAY | Freq: Four times a day (QID) | RESPIRATORY_TRACT | Status: DC | PRN

## 2015-05-06 MED ORDER — IBUPROFEN 200 MG PO TABS: 600.0000 mg | ORAL_TABLET | Freq: Three times a day (TID) | ORAL | Status: DC | PRN

## 2015-05-06 MED ORDER — LORAZEPAM 1 MG PO TABS: 1.0000 mg | ORAL_TABLET | Freq: Three times a day (TID) | ORAL | Status: DC | PRN

## 2015-05-06 MED ORDER — ESCITALOPRAM OXALATE 10 MG PO TABS
20.0000 mg | ORAL_TABLET | Freq: Every day | ORAL | Status: DC
  Administered 2015-05-07: 20 mg via ORAL
  Filled 2015-05-06: qty 2

## 2015-05-06 MED ORDER — NICOTINE 21 MG/24HR TD PT24
21.0000 mg | MEDICATED_PATCH | Freq: Every day | TRANSDERMAL | Status: DC
  Administered 2015-05-07: 21 mg via TRANSDERMAL
  Filled 2015-05-06: qty 1

## 2015-05-06 MED ORDER — ZOLPIDEM TARTRATE 5 MG PO TABS
5.0000 mg | ORAL_TABLET | Freq: Every evening | ORAL | Status: DC | PRN
  Administered 2015-05-06: 5 mg via ORAL
  Filled 2015-05-06: qty 1

## 2015-05-06 MED ORDER — ALUM & MAG HYDROXIDE-SIMETH 200-200-20 MG/5ML PO SUSP: 30.0000 mL | ORAL | Status: DC | PRN

## 2015-05-06 MED ORDER — ACETAMINOPHEN 325 MG PO TABS: 650.0000 mg | ORAL_TABLET | ORAL | Status: DC | PRN

## 2015-05-06 MED ORDER — METOPROLOL TARTRATE 25 MG PO TABS
25.0000 mg | ORAL_TABLET | Freq: Two times a day (BID) | ORAL | Status: DC
Start: 2015-05-06 — End: 2015-05-07
  Administered 2015-05-06 – 2015-05-07 (×2): 25 mg via ORAL
  Filled 2015-05-06 (×2): qty 1

## 2015-05-06 MED ORDER — BUDESONIDE-FORMOTEROL FUMARATE 160-4.5 MCG/ACT IN AERO
2.0000 | INHALATION_SPRAY | Freq: Two times a day (BID) | RESPIRATORY_TRACT | Status: DC
  Administered 2015-05-07: 2 via RESPIRATORY_TRACT
  Filled 2015-05-06: qty 6

## 2015-05-06 NOTE — ED Provider Notes (Signed)
CSN: [id number]     Arrival date & time 05/06/15  1810 History   First MD Initiated Contact with Patient 05/06/15 2039     Chief Complaint  Patient presents with  . Suicidal  . Homicidal     (Consider location/radiation/quality/duration/timing/severity/associated sxs/prior Treatment) HPI  Blood pressure 144/93, pulse 87, temperature 98.6 F (37 C), temperature source Oral, resp. rate 16, SpO2 96 %.  Lori Owens is a 54 y.o. female who presents to the ED voluntarily with past medical history significant for depression, COPD advised to present to the emergency room by her PCP Dr. Ernie Hew for evaluation of suicidal ideation homicidal ideation. Patient has recently switched psychiatric meds, she was formally taking Paxil 40 mg she was switched to Lexapro 20 mg last week she's had suicidal ideation stating that she would run her car into another car on the road. She said homicidal ideation thinking that she would like to shoot her family members specifically her boyfriend and her mother. Patient has access to guns in the home and she tried to obtain a gun license but should she couldn't afford it. She denies any prior suicide attempts or homicide attempts. She denies any drug or alcohol use. She endorses auditory hallucinations and she states that she hears voices and she sometimes talks to them she states that these voices are non-commanding that the voices try to calm her down. When questioned as to any recent life changes she states that her family just doesn't listen to her and this upsets her.  Past Medical History  Diagnosis Date  . Depression 01/18/2013  . Tobacco abuse 01/18/2013  . Headache(784.0)   . COPD (chronic obstructive pulmonary disease)   . Heart murmur   . Melanoma   . Seasonal allergies   . Hypertension    Past Surgical History  Procedure Laterality Date  . Throat surgery    . Abdominal hysterectomy    . Nose surgery      polyp removal from nasal cavity   .  Tonsillectomy     Family History  Problem Relation Age of Onset  . Diabetes Maternal Grandfather   . Heart disease Maternal Grandfather   . Cancer Maternal Grandfather   . Hypertension Maternal Grandfather    History  Substance Use Topics  . Smoking status: Current Every Day Smoker -- 0.50 packs/day  . Smokeless tobacco: Never Used  . Alcohol Use: No   OB History    No data available     Review of Systems  10 systems reviewed and found to be negative, except as noted in the HPI.  Allergies  Review of patient's allergies indicates no known allergies.  Home Medications   Prior to Admission medications   Medication Sig Start Date End Date Taking? Authorizing Provider  albuterol (PROVENTIL HFA;VENTOLIN HFA) 108 (90 BASE) MCG/ACT inhaler Inhale 2 puffs into the lungs every 6 (six) hours as needed for wheezing or shortness of breath. 09/05/14   Orson Eva, MD  albuterol (PROVENTIL) (5 MG/ML) 0.5% nebulizer solution Take 0.5 mLs (2.5 mg total) by nebulization every 6 (six) hours as needed for wheezing or shortness of breath (COPD). 09/19/14   Lance Bosch, NP  ALPRAZolam Duanne Moron) 0.5 MG tablet Take 0.5 mg by mouth at bedtime as needed for anxiety.    Historical Provider, MD  budesonide-formoterol (SYMBICORT) 160-4.5 MCG/ACT inhaler Take 2 puffs first thing in am and then another 2 puffs about 12 hours later. 05/02/15   Tanda Rockers, MD  escitalopram (  LEXAPRO) 20 MG tablet Take 20 mg by mouth daily.    Historical Provider, MD  metoprolol tartrate (LOPRESSOR) 25 MG tablet TAKE ONE TABLET BY MOUTH TWICE DAILY 01/06/15   Lance Bosch, NP  Probiotic Product (PROBIOTIC PO) Take 1 tablet by mouth daily.     Historical Provider, MD  SINGULAIR 10 MG tablet Take 1 tablet by mouth daily. 02/12/15   Historical Provider, MD  varenicline (CHANTIX PAK) 0.5 MG X 11 & 1 MG X 42 tablet Take one 0.5 mg tablet by mouth once daily for 3 days, then increase to one 0.5 mg tablet twice daily for 4 days, then  increase to one 1 mg tablet twice daily. 05/02/15   Tanda Rockers, MD   BP 144/93 mmHg  Pulse 87  Temp(Src) 98.6 F (37 C) (Oral)  Resp 16  SpO2 96% Physical Exam  Constitutional: She is oriented to person, place, and time. She appears well-developed and well-nourished. No distress.  HENT:  Head: Normocephalic and atraumatic.  Mouth/Throat: Oropharynx is clear and moist.  Eyes: Conjunctivae and EOM are normal.  Neck: Normal range of motion.  Cardiovascular: Normal rate, regular rhythm and intact distal pulses.   Pulmonary/Chest: Effort normal and breath sounds normal. No stridor. No respiratory distress. She has no wheezes. She has no rales. She exhibits no tenderness.  Abdominal: Soft. Bowel sounds are normal. She exhibits no distension. There is no tenderness. There is no rebound and no guarding.  Musculoskeletal: Normal range of motion.  Neurological: She is alert and oriented to person, place, and time.  Psychiatric: Her affect is blunt. Her speech is delayed. She is slowed. She is not hyperactive and not combative. Thought content is not paranoid. She expresses homicidal and suicidal ideation. She expresses suicidal plans and homicidal plans.  Nursing note and vitals reviewed.   ED Course  Procedures (including critical care time) Labs Review Labs Reviewed  ACETAMINOPHEN LEVEL - Abnormal; Notable for the following:    Acetaminophen (Tylenol), Serum <10 (*)    All other components within normal limits  URINE RAPID DRUG SCREEN, HOSP PERFORMED - Abnormal; Notable for the following:    Benzodiazepines POSITIVE (*)    All other components within normal limits  CBC  COMPREHENSIVE METABOLIC PANEL  ETHANOL  SALICYLATE LEVEL    Imaging Review No results found.   EKG Interpretation None      MDM   Final diagnoses:  Homicidal ideation  Suicidal ideation    Filed Vitals:   05/06/15 1828  BP: 144/93  Pulse: 87  Temp: 98.6 F (37 C)  TempSrc: Oral  Resp: 16   SpO2: 96%    Medications  alum & mag hydroxide-simeth (MAALOX/MYLANTA) 200-200-20 MG/5ML suspension 30 mL (not administered)  ondansetron (ZOFRAN) tablet 4 mg (not administered)  nicotine (NICODERM CQ - dosed in mg/24 hours) patch 21 mg (not administered)  zolpidem (AMBIEN) tablet 5 mg (not administered)  ibuprofen (ADVIL,MOTRIN) tablet 600 mg (not administered)  acetaminophen (TYLENOL) tablet 650 mg (not administered)  LORazepam (ATIVAN) tablet 1 mg (not administered)  albuterol (PROVENTIL HFA;VENTOLIN HFA) 108 (90 BASE) MCG/ACT inhaler 2 puff (not administered)  ALPRAZolam (XANAX) tablet 0.5 mg (not administered)  budesonide-formoterol (SYMBICORT) 160-4.5 MCG/ACT inhaler 2 puff (not administered)  escitalopram (LEXAPRO) tablet 20 mg (not administered)  metoprolol tartrate (LOPRESSOR) tablet 25 mg (not administered)  montelukast (SINGULAIR) tablet 10 mg (not administered)    Lori Owens is a pleasant 54 y.o. female presenting with suicidal ideation with the plan, homicidal  ideation with the plan. Patient has access to guns in her primary residence. She is here voluntarily. Patient had recent medication change from Paxil to Lexapro. She has auditory hallucinations but these do not appear to be new onset. Patient is not floridly psychotic. She presents to the ED voluntarily as directed by her primary care physician. Urine drug screen is positive for benzodiazepine and she is prescribed Xanax.  Patient is medically cleared for psychiatric evaluation will be transferred to the psych ED. TTS consulted, home meds and psych standard holding orders placed.    Monico Blitz, PA-C 05/06/15 5320  Charlesetta Shanks, MD 05/09/15 1038

## 2015-05-06 NOTE — Telephone Encounter (Signed)
Received approval from [employer] for [employer] Coverage valid thru--05/05/16. Patient aware PA [id number]

## 2015-05-06 NOTE — ED Notes (Addendum)
Pt has 2 belonging bags

## 2015-05-06 NOTE — ED Notes (Signed)
Per MHT, pt was difficult to arouse and could not sit up long enough to conduct vital signs. Discussed with [facility] and receiving RN. Will hold transfer until AM due to sedation

## 2015-05-06 NOTE — BH Assessment (Addendum)
Tele Assessment Note   Lori Owens is a 54 y.o. female who voluntarily presents to [facility] for psych eval at the request of her physician--DR. Dewey for SI/HI thoughts.  Pt states that she has recently changed her medication from paxil to lexapro, 2 wks ago and she has increased anger towards her family for months and the anger has worsened x1week.  Pt says she is SI x40mos and says--"I don't care if I'm here or not", stating that she wouldn't care if another car hit her or if she ran off the road.  Pt says she care if she dies.  Pt says she checked on purchase of a gun.  Pt says her emotional state was triggered by worsening health problems(says she was dx with heart and breathing problems and told by her drs. That she didn't have any health problems.  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  She also says she lives with her mother and boyfriend and says she has to take care of them and her self and she is stressed out.  Pt says she has been hearing voices x60month without command, says that "they help me, they're not telling to something wrong.  Pt denies SA. Pt reports HI and says she wanted to shoot her family because they don't listen to her and it upsets her.  Pt hx SI attempts, no outpt/inpt mental health svcs.  Axis I: Major Depression, Recurrent severe Axis II: Deferred Axis III:  Past Medical History  Diagnosis Date  . Depression 01/18/2013  . Tobacco abuse 01/18/2013  . Headache(784.0)   . COPD (chronic obstructive pulmonary disease)   . Heart murmur   . Melanoma   . Seasonal allergies   . Hypertension    Axis IV: other psychosocial or environmental problems, problems related to social environment, problems with access to health care services and problems with primary support group Axis V: 31-40 impairment in reality testing  Past Medical History:  Past Medical History  Diagnosis Date  . Depression 01/18/2013  . Tobacco abuse  01/18/2013  . Headache(784.0)   . COPD (chronic obstructive pulmonary disease)   . Heart murmur   . Melanoma   . Seasonal allergies   . Hypertension     Past Surgical History  Procedure Laterality Date  . Throat surgery    . Abdominal hysterectomy    . Nose surgery      polyp removal from nasal cavity   . Tonsillectomy      Family History:  Family History  Problem Relation Age of Onset  . Diabetes Maternal Grandfather   . Heart disease Maternal Grandfather   . Cancer Maternal Grandfather   . Hypertension Maternal Grandfather     Social History:  reports that she has been smoking.  She has never used smokeless tobacco. She reports that she does not drink alcohol or use illicit drugs.  Additional Social History:  Alcohol / Drug Use Pain Medications: See MAR  Prescriptions: See MAR  Over the Counter: See MAR  History of alcohol / drug use?: No history of alcohol / drug abuse Longest period of sobriety (when/how long): Pt denies use   CIWA: CIWA-Ar BP: 144/93 mmHg Pulse Rate: 87 COWS:    PATIENT STRENGTHS: (choose at least two) Communication skills Motivation for treatment/growth  Allergies: No Known Allergies  Home Medications:  (Not in a hospital admission)  OB/GYN Status:  No LMP recorded. Patient has had a  hysterectomy.  General Assessment Data Location of Assessment: [facility] ED TTS Assessment: In system Is this a Tele or Face-to-Face Assessment?: Tele Assessment Is this an Initial Assessment or a Re-assessment for this encounter?: Initial Assessment Marital status: Single Maiden name: None  Is patient pregnant?: No Pregnancy Status: No Living Arrangements: Spouse/significant other, Parent (Lives w/boyfriend and mother ) Can pt return to current living arrangement?: Yes Admission Status: Voluntary Is patient capable of signing voluntary admission?: Yes Referral Source: MD Insurance type: [place] Screening Exam ([place]) Medical Exam  completed: No Reason for MSE not completed: Other: (None )  Crisis Care Plan Living Arrangements: Spouse/significant other, Parent (Lives w/boyfriend and mother ) Name of Psychiatrist: None  Name of Therapist: None   Education Status Is patient currently in school?: No Current Grade: None  Highest grade of school patient has completed: None  Name of school: None  Contact person: None   Risk to self with the past 6 months Suicidal Ideation: Yes-Currently Present Has patient been a risk to self within the past 6 months prior to admission? : Yes Suicidal Intent: Yes-Currently Present Has patient had any suicidal intent within the past 6 months prior to admission? : No Is patient at risk for suicide?: Yes Suicidal Plan?: Yes-Currently Present Has patient had any suicidal plan within the past 6 months prior to admission? : No Specify Current Suicidal Plan: Doesnt care if she lives or dies  Access to Means: Yes Specify Access to Suicidal Means: Various plans  What has been your use of drugs/alcohol within the last 12 months?: Pt denies  Previous Attempts/Gestures: No How many times?: 0 Other Self Harm Risks: None  Triggers for Past Attempts: None known Intentional Self Injurious Behavior: None Family Suicide History: No Recent stressful life event(s): Conflict (Comment), Recent negative physical changes (Issues with family members ) Persecutory voices/beliefs?: Yes Depression: Yes Depression Symptoms: Loss of interest in usual pleasures, Feeling angry/irritable, Feeling worthless/self pity Substance abuse history and/or treatment for substance abuse?: No Suicide prevention information given to non-admitted patients: Not applicable  Risk to Others within the past 6 months Homicidal Ideation: Yes-Currently Present Does patient have any lifetime risk of violence toward others beyond the six months prior to admission? : No Thoughts of Harm to Others: Yes-Currently Present Comment -  Thoughts of Harm to Others: Thoughts of harming mother and boyfriend  Current Homicidal Intent: No-Not Currently/Within Last 6 Months Current Homicidal Plan: Yes-Currently Present Describe Current Homicidal Plan: Pt told medical staff that she wanted to shoot her family  Access to Homicidal Means: No Identified Victim: Mother and Boyfriend  History of harm to others?: No Assessment of Violence: None Noted Violent Behavior Description: None  Does patient have access to weapons?: No Criminal Charges Pending?: No Does patient have a court date: No Is patient on probation?: No  Psychosis Hallucinations: Auditory Delusions: None noted  Mental Status Report Appearance/Hygiene: In scrubs Eye Contact: Good Motor Activity: Unremarkable Speech: Logical/coherent Level of Consciousness: Alert Mood: Depressed, Irritable Affect: Depressed, Irritable Anxiety Level: Minimal Thought Processes: Coherent, Relevant Judgement: Impaired Orientation: Person, Place, Time, Situation Obsessive Compulsive Thoughts/Behaviors: None  Cognitive Functioning Concentration: Normal Memory: Recent Intact, Remote Intact IQ: Average Insight: Poor Impulse Control: Poor Appetite: Good Weight Loss: 0 Weight Gain: 0 Sleep: Decreased Total Hours of Sleep: 5 Vegetative Symptoms: None  ADLScreening [facility] Assessment Services) Patient's cognitive ability adequate to safely complete daily activities?: Yes Patient able to express need for assistance with ADLs?: Yes Independently performs  ADLs?: Yes (appropriate for developmental age)  Prior Inpatient Therapy Prior Inpatient Therapy: No Prior Therapy Dates: None  Prior Therapy Facilty/Provider(s): None  Reason for Treatment: None   Prior Outpatient Therapy Prior Outpatient Therapy: No Prior Therapy Dates: None  Prior Therapy Facilty/Provider(s): None  Reason for Treatment: None  Does patient have an ACCT team?: No Does patient have Intensive In-House  Services?  : No Does patient have [facility] services? : No Does patient have P4CC services?: No  ADL Screening (condition at time of admission) Patient's cognitive ability adequate to safely complete daily activities?: Yes Is the patient deaf or have difficulty hearing?: No Does the patient have difficulty seeing, even when wearing glasses/contacts?: No Does the patient have difficulty concentrating, remembering, or making decisions?: No Patient able to express need for assistance with ADLs?: Yes Does the patient have difficulty dressing or bathing?: No Independently performs ADLs?: Yes (appropriate for developmental age) Does the patient have difficulty walking or climbing stairs?: No Weakness of Legs: None Weakness of Arms/Hands: None  Home Assistive Devices/Equipment Home Assistive Devices/Equipment: None  Therapy Consults (therapy consults require a physician order) PT Evaluation Needed: No OT Evalulation Needed: No SLP Evaluation Needed: No Abuse/Neglect Assessment (Assessment to be complete while patient is alone) Physical Abuse: Denies Verbal Abuse: Denies Sexual Abuse: Denies Exploitation of patient/patient's resources: Denies Self-Neglect: Denies Values / Beliefs Cultural Requests During Hospitalization: None Spiritual Requests During Hospitalization: None Consults Spiritual Care Consult Needed: No Social Work Consult Needed: No Regulatory affairs officer (For Healthcare) Does patient have an advance directive?: No Would patient like information on creating an advanced directive?: No - patient declined information    Additional Information 1:1 In Past 12 Months?: No CIRT Risk: No Elopement Risk: No Does patient have medical clearance?: Yes     Disposition:  Disposition Disposition of Patient: Inpatient treatment program, Referred to (Per Patriciaann Clan, PA ) Type of inpatient treatment program: Adult Patient referred to: Other (Comment) (Per Patriciaann Clan, PA  )  Polo Riley C 05/06/2015 9:50 PM

## 2015-05-06 NOTE — ED Notes (Signed)
Bed: [facility]43 Expected date:  Expected time:  Means of arrival:  Comments: Triage 4 

## 2015-05-06 NOTE — Telephone Encounter (Signed)
Received PA request from Okey Dupre for Chantix 0.5 &1 mg PA has been processed thru [email or web address]. Key [id number] Patient aware the PA submitted today and we are awaiting decision 24-72 hrs.

## 2015-05-06 NOTE — ED Notes (Signed)
Upon pt entering traige room, she threw all her belongings on the floor.

## 2015-05-06 NOTE — ED Notes (Signed)
Pt c/o SI w/ a plan "to run off the road" and HI toward family members x 2-3 days and auditory hallucinations x 1 week.    Pt reports that hallucinations are "trying to calm me down," but sometimes they don't help.  Pt reports medication change x 2 weeks ago.  Sts family and significant other stressors.  Denies medical complaints.   Pt is treated by her PCP, Ernie Hew MD.

## 2015-05-07 ENCOUNTER — Encounter: Payer: 59 | Admitting: Cardiology

## 2015-05-07 ENCOUNTER — Encounter (HOSPITAL_COMMUNITY): Payer: Self-pay | Admitting: *Deleted

## 2015-05-07 ENCOUNTER — Inpatient Hospital Stay (HOSPITAL_COMMUNITY)
Admission: EM | Admit: 2015-05-07 | Discharge: 2015-05-12 | DRG: 885 | Disposition: A | Discharge status: Home / Self Care | Payer: 59 | Source: Intra-hospital | Attending: Psychiatry | Admitting: Psychiatry

## 2015-05-07 DIAGNOSIS — F332 Major depressive disorder, recurrent severe without psychotic features: Principal | ICD-10-CM | POA: Diagnosis present

## 2015-05-07 DIAGNOSIS — F17200 Nicotine dependence, unspecified, uncomplicated: Secondary | ICD-10-CM | POA: Diagnosis present

## 2015-05-07 DIAGNOSIS — F17210 Nicotine dependence, cigarettes, uncomplicated: Secondary | ICD-10-CM | POA: Diagnosis present

## 2015-05-07 DIAGNOSIS — I10 Essential (primary) hypertension: Secondary | ICD-10-CM | POA: Diagnosis present

## 2015-05-07 DIAGNOSIS — R45851 Suicidal ideations: Secondary | ICD-10-CM | POA: Diagnosis present

## 2015-05-07 DIAGNOSIS — F329 Major depressive disorder, single episode, unspecified: Secondary | ICD-10-CM | POA: Diagnosis present

## 2015-05-07 DIAGNOSIS — J449 Chronic obstructive pulmonary disease, unspecified: Secondary | ICD-10-CM | POA: Diagnosis present

## 2015-05-07 DIAGNOSIS — F32A Depression, unspecified: Secondary | ICD-10-CM | POA: Diagnosis present

## 2015-05-07 DIAGNOSIS — Z720 Tobacco use: Secondary | ICD-10-CM | POA: Diagnosis not present

## 2015-05-07 LAB — TSH: TSH: 2.759 u[IU]/mL (ref 0.350–4.500)

## 2015-05-07 MED ORDER — MAGNESIUM HYDROXIDE 400 MG/5ML PO SUSP: 30.0000 mL | Freq: Every day | ORAL | Status: DC | PRN

## 2015-05-07 MED ORDER — TRAZODONE HCL 50 MG PO TABS
50.0000 mg | ORAL_TABLET | Freq: Every day | ORAL | Status: DC
  Administered 2015-05-07 – 2015-05-08 (×2): 50 mg via ORAL
  Filled 2015-05-07 (×4): qty 1

## 2015-05-07 MED ORDER — SERTRALINE HCL 25 MG PO TABS
25.0000 mg | ORAL_TABLET | Freq: Every day | ORAL | Status: DC
  Administered 2015-05-07 – 2015-05-12 (×6): 25 mg via ORAL
  Filled 2015-05-07 (×2): qty 1
  Filled 2015-05-07: qty 3
  Filled 2015-05-07 (×4): qty 1
  Filled 2015-05-07: qty 3
  Filled 2015-05-07: qty 1

## 2015-05-07 MED ORDER — NICOTINE 14 MG/24HR TD PT24
14.0000 mg | MEDICATED_PATCH | Freq: Every day | TRANSDERMAL | Status: DC
  Administered 2015-05-08 – 2015-05-10 (×3): 14 mg via TRANSDERMAL
  Filled 2015-05-07 (×7): qty 1

## 2015-05-07 MED ORDER — ALPRAZOLAM 0.25 MG PO TABS: 0.2500 mg | ORAL_TABLET | Freq: Every evening | ORAL | Status: DC | PRN

## 2015-05-07 MED ORDER — HYDROXYZINE HCL 25 MG PO TABS
25.0000 mg | ORAL_TABLET | Freq: Four times a day (QID) | ORAL | Status: DC | PRN
  Administered 2015-05-07 – 2015-05-10 (×3): 25 mg via ORAL
  Filled 2015-05-07 (×3): qty 1

## 2015-05-07 MED ORDER — SERTRALINE HCL 25 MG PO TABS: 25.0000 mg | ORAL_TABLET | Freq: Every day | ORAL | Status: DC

## 2015-05-07 MED ORDER — ALUM & MAG HYDROXIDE-SIMETH 200-200-20 MG/5ML PO SUSP: 30.0000 mL | ORAL | Status: DC | PRN

## 2015-05-07 MED ORDER — ACETAMINOPHEN 325 MG PO TABS
650.0000 mg | ORAL_TABLET | Freq: Four times a day (QID) | ORAL | Status: DC | PRN
  Administered 2015-05-07: 650 mg via ORAL
  Filled 2015-05-07: qty 2

## 2015-05-07 NOTE — [facility] Suicide Risk Assessment (Signed)
[facility] Admission Suicide Risk Assessment   Nursing information obtained from:  Patient Demographic factors:  Caucasian, Low socioeconomic status, Unemployed Current Mental Status:  NA Loss Factors:  NA Historical Factors:  NA Risk Reduction Factors:  Religious beliefs about death, Living with another person, especially a relative Total Time spent with patient: 30 minutes Principal Problem: MDD (major depressive disorder), recurrent severe, without psychosis Diagnosis:   Patient Active Problem List   Diagnosis Date Noted  . MDD (major depressive disorder), recurrent severe, without psychosis [F33.2] 05/07/2015  . Tobacco use disorder [Z72.0] 05/07/2015  . Obesity [E66.9] 05/03/2015  . Excessive daytime sleepiness [G47.19] 03/03/2015  . Heart palpitations [R00.2] 12/24/2014  . Chest pain [R07.9] 12/23/2014  . Snoring [R06.83] 12/23/2014  . Witnessed apneic spells [R06.81] 12/23/2014  . Cardiomyopathy [I42.9] 09/19/2014  . Mild persistent asthma [J45.30] 09/03/2014  . COPD exacerbation [J44.1] 09/02/2014  . Suspicious mole [D22.9] 04/19/2013  . SOB (shortness of breath) [R06.02] 04/19/2013  . Hypokalemia [E87.6] 01/18/2013  . Dehydration [E86.0] 01/18/2013  . Tachycardia [R00.0] 01/18/2013     Continued Clinical Symptoms:    The "Alcohol Use Disorders Identification Test", Guidelines for Use in Primary Care, Second Edition.  World Pharmacologist [facility]). Score between 0-7:  no or low risk or alcohol related problems. Score between 8-15:  moderate risk of alcohol related problems. Score between 16-19:  high risk of alcohol related problems. Score 20 or above:  warrants further diagnostic evaluation for alcohol dependence and treatment.   CLINICAL FACTORS:   Depression:   Hopelessness Impulsivity Insomnia Previous Psychiatric Diagnoses and Treatments   Musculoskeletal: Strength & Muscle Tone: within normal limits Gait & Station: normal Patient leans: N/A  Psychiatric  Specialty Exam: Physical Exam  Review of Systems  Psychiatric/Behavioral: Positive for depression and suicidal ideas. The patient is nervous/anxious and has insomnia.   All other systems reviewed and are negative.   Blood pressure 131/95, pulse 79, temperature 97.9 F (36.6 C), temperature source Oral, height 5\' 6"  (1.676 m), weight 95.709 kg (211 lb), SpO2 97 %.Body mass index is 34.07 kg/(m^2).  Please see H&P FOR mse   COGNITIVE FEATURES THAT CONTRIBUTE TO RISK:  Closed-mindedness, Polarized thinking and Thought constriction (tunnel vision)    SUICIDE RISK:   Moderate:  Frequent suicidal ideation with limited intensity, and duration, some specificity in terms of plans, no associated intent, good self-control, limited dysphoria/symptomatology, some risk factors present, and identifiable protective factors, including available and accessible social support.  PLAN OF CARE: Please see H&P.   Medical Decision Making:  Review of Psycho-Social Stressors (1), Review or order clinical lab tests (1), Review and summation of old records (2), Established Problem, Worsening (2), Review of Medication Regimen & Side Effects (2) and Review of New Medication or Change in Dosage (2)  I certify that inpatient services furnished can reasonably be expected to improve the patient's condition.   Magdalyn Arenivas MD 05/07/2015, 12:35 PM

## 2015-05-07 NOTE — Tx Team (Signed)
Initial Interdisciplinary Treatment Plan   PATIENT STRESSORS: Financial difficulties Marital or family conflict   PATIENT STRENGTHS: Ability for insight Curator fund of knowledge   PROBLEM LIST: Problem List/Patient Goals Date to be addressed Date deferred Reason deferred Estimated date of resolution  "Medication Managment" 05/07/15     "Anxiety" 05/07/15     "Depression" 05/07/15     Suicide 05/07/15                                    DISCHARGE CRITERIA:  Ability to meet basic life and health needs Improved stabilization in mood, thinking, and/or behavior Verbal commitment to aftercare and medication compliance  PRELIMINARY DISCHARGE PLAN: Attend aftercare/continuing care group Outpatient therapy Return to previous living arrangement  PATIENT/FAMIILY INVOLVEMENT: This treatment plan has been presented to and reviewed with the patient, Lori Owens, and/or family member.  The patient and family have been given the opportunity to ask questions and make suggestions.  Beverly Sessions Violon 05/07/2015, 12:09 PM

## 2015-05-07 NOTE — B[facility] Counselor (Signed)
Adult Comprehensive Assessment  Patient ID: Lori Owens, female   DOB: Jan 24, 1961, 54 y.o.   MRN: [id number]  Information Source: Information source: Patient  Current Stressors:  Educational / Learning stressors: None Employment / Job issues: None. 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  Family Relationships: Patient identifies her family to be a source of stress.Marland Kitchen "They don't mean to but they do. Patient reports she takes care of her mother who is 26 years old and lives.  Financial / Lack of resources (include bankruptcy): Patient reports "I don't worry about that kind of stuff. I just pay bills."  Housing / Lack of housing: None  Physical health (include injuries & life threatening diseases): Patient reports stress about her physical health but refused to go in to detail. Patient states she thought she had COPD but states her doctor reported she did not have this condition.  Social relationships: None per patient  Substance abuse: None  Bereavement / Loss: None  Living/Environment/Situation:  Living Arrangements: Parent, Spouse/significant other Living conditions (as described by patient or guardian): Patient lives with her boyfriend and mother. Boyfriend is an alcoholic per patient and does not want to get better. 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  What is atmosphere in current home:  (Boring)<- Patient's response to her current atmosphere at home.  Family History:  Marital status: Single Does patient have children?: Yes How many children?: 2 How is patient's relationship with their children?: Patient reports a "normal" relationship with her two children. "No different than anyone else's" per patient.   Childhood History:  By whom was/is the patient raised?: Mother Description of patient's relationship with caregiver when they were a child: Patient reports that she didn't have a relationship  with her mother as a child due to her mother being catholic and being forced to get married although she was gay. Father committed suicide when patient was 54 years old. Patient's mother then had a 68 year relationship with a woman. "It doesn't bother me" per patient.  Patient's description of current relationship with people who raised him/her: Patient reports that she has a connection with her mother now. "We can finish each other's sentences." per patient.  Does patient have siblings?: Yes Number of Siblings: 1 Description of patient's current relationship with siblings: Patient has 1 brother who is on [employer] issues.   Did patient suffer any verbal/emotional/physical/sexual abuse as a child?: No Did patient suffer from severe childhood neglect?: No Has patient ever been sexually abused/assaulted/raped as an adolescent or adult?: No Was the patient ever a victim of a crime or a disaster?: No Witnessed domestic violence?: No Has patient been effected by domestic violence as an adult?: No  Education:  Highest grade of school patient has completed: 1 year of nursing school  Currently a student?: No Learning disability?: No  Employment/Work Situation:   Employment situation: Employed Where is patient currently employed?: 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 Has patient ever been in the [employer]?: No Has patient ever served in Recruitment consultant?: No  Financial Resources:   Museum/gallery curator resources: Income from employment  Does patient have a representative payee or guardian?: No  Alcohol/Substance Abuse:   What has been your use of drugs/alcohol within the last 12  months?: None  If attempted suicide, did drugs/alcohol play a role in this?: No Alcohol/Substance Abuse Treatment Hx: Denies past history Has alcohol/substance abuse ever caused legal problems?: No  Social Support System:   Patient's Community Support System: Fair Describe Community Support System: Patient reports that her mother is the only source of support that she has. "She's my best friend" Type of faith/religion: None  How does patient's faith help to cope with current illness?: N/A  Leisure/Recreation:   Leisure and Hobbies: Patient reports that she does not have any hobbies. "Im too busy taking care of my family. I don't know how to set boundaries"  Strengths/Needs:   What things does the patient do well?: Patient states that she does everything well but specifically bike riding and cleaning.  In what areas does patient struggle / problems for patient: Patient reports she struggles with herself all the time "I cant make boundaries with people"  Discharge Plan:   Does patient have access to transportation?: Yes Will patient be returning to same living situation after discharge?: Yes Currently receiving community mental health services: No If no, would patient like referral for services when discharged?: Yes (What county?) [facility] [place]) Does patient have financial barriers related to discharge medications?: No  Summary/Recommendations:   Summary and Recommendations (to be completed by the evaluator): Patient is a 54 year old female who presents with the presenting problem of suicidal ideations. Patient reports that her mother lives with her which can sometimes cause her to have additional stress. Patient reports she has no outpatient providers at this time and would like referrals upon discharge. Patient reports she has her own transportation.   PICKETT JR, Fajr Fife C. 05/07/2015

## 2015-05-07 NOTE — Progress Notes (Signed)
D    Pt is pleasant on approach and cooperative  She endorses depression and anxiety   She has passive SI but contracts for safety on the unit   She attended group and was appropriate    Her interactions are appropriate  A   Verbal support given  Medications administered and effectiveness monitored   Q 15 min checks R   Pt safe at present

## 2015-05-07 NOTE — H&P (Addendum)
Psychiatric Admission Assessment Adult  Patient Identification: Lori Owens MRN:  [id number] Date of Evaluation:  05/07/2015 Chief Complaint: Patient states " I am depressed, I was having intense suicidal thoughts.'   Principal Diagnosis: MDD (major depressive disorder), recurrent severe, without psychosis Diagnosis:   Patient Active Problem List   Diagnosis Date Noted  . MDD (major depressive disorder), recurrent severe, without psychosis [F33.2] 05/07/2015  . Tobacco use disorder [Z72.0] 05/07/2015  . Obesity [E66.9] 05/03/2015  . Excessive daytime sleepiness [G47.19] 03/03/2015  . Heart palpitations [R00.2] 12/24/2014  . Chest pain [R07.9] 12/23/2014  . Snoring [R06.83] 12/23/2014  . Witnessed apneic spells [R06.81] 12/23/2014  . Cardiomyopathy [I42.9] 09/19/2014  . Mild persistent asthma [J45.30] 09/03/2014  . COPD exacerbation [J44.1] 09/02/2014  . Suspicious mole [D22.9] 04/19/2013  . SOB (shortness of breath) [R06.02] 04/19/2013  . Hypokalemia [E87.6] 01/18/2013  . Dehydration [E86.0] 01/18/2013  . Tachycardia [R00.0] 01/18/2013       History of Present Illness:: Lori Owens is a 54 y.o. White female , divorced , self employed - has [employer], [employer] , has a hx of depression, who voluntarily presented to [facility] for psych eval at the request of her physician-Dr. Darrall Dears . Patient seen today . Pt appeared to be calm, cooperative . Pt endorsed multiple depressive sx- sleep issues , appetite reduction , hopelessness, feeling tired as well as irritability , suicidal thoughts . Pt also reports anxiety sx- reports she has been snapping at every one at home , since no one would listen to her. Pt reports that she was on Paxil in the past . She was diagnosed with depression 15 yrs ago. Pt however started having worsening sx, 2 weeks ago , her PMD took her off the Paxil abruptly and started her on Lexapro. Pt reports worsening SI after starting Lexapro. Pt was driving to work and  wanted to drive off the road or let a car hit her. Pt also went to pawn her ring recently and saw this gun at the pawn shop and wanted to but it and shoot self. Pt reports going through these kinds of moments when she plans out her suicide. Pt however reports that she does nto want to do it and that is why she decided to get help. Pt has two kids and two grand children and  An elderly mother whom she takes care of , and her sense of responsibility to the family is the only thing that is keeping her safe.  Pt also with other stressors - she was diagnosed with COPD , cardiomyopathy in the past - but when she went to another cardiologist recently - was told she does not have cardiomyopathy. Notes in EHR - per pulmonolgy also indicates that she does not have COPD. 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 Pt also has an alcoholic boyfriend who frustrates her.  Pt also reported AH while in ED, did not express it to Probation officer.  Pt denies any past hospitalizations / suicide attempts .  Pt uses tobacco- smokes a lot of cigarettes . Pt denies abusing any other illicit drugs.  Elements:  Location:  depression. Quality:  see above. Severity:  severe. Timing:  past 2 weeks . Duration:  acute - past 2 weeks. Context:  hx of MDD, medical issues. Associated Signs/Symptoms: Depression  Symptoms:  insomnia, difficulty concentrating, hopelessness, recurrent thoughts of death, anxiety, disturbed sleep, decreased appetite, (Hypo) Manic Symptoms:  Impulsivity, Irritable Mood, Anxiety Symptoms:  anxiety sx Psychotic Symptoms:  Hallucinations: pt did report AH in the ED - did not express it to writer PTSD Symptoms: NA Total Time spent with patient: 1 hour  Past Medical History:  Past Medical History  Diagnosis Date  . Depression  01/18/2013  . Tobacco abuse 01/18/2013  . Headache(784.0)   . COPD (chronic obstructive pulmonary disease)   . Heart murmur   . Melanoma   . Seasonal allergies   . Hypertension     Past Surgical History  Procedure Laterality Date  . Throat surgery    . Abdominal hysterectomy    . Nose surgery      polyp removal from nasal cavity   . Tonsillectomy     Family History:  Family History  Problem Relation Age of Onset  . Diabetes Maternal Grandfather   . Heart disease Maternal Grandfather   . Cancer Maternal Grandfather   . Hypertension Maternal Grandfather    Social History:  History  Alcohol Use No     History  Drug Use No    History   Social History  . Marital Status: Single    Spouse Name: N/A  . Number of Children: N/A  . Years of Education: N/A   Social History Main Topics  . Smoking status: Current Every Day Smoker -- 0.50 packs/day  . Smokeless tobacco: Never Used  . Alcohol Use: No  . Drug Use: No  . Sexual Activity: No   Other Topics Concern  . None   Social History Narrative   ** Merged History Encounter **       Additional Social History:             Patient was born in [place]. Patient was raised by mother . Her father shot self while she was [age] years old. Her mother being homosexual did not marry again. Pt was married twice, divorced x2, currently has an alcoholic boyfriend. 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 Went in to [employer] . 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             Musculoskeletal: Strength & Muscle Tone: within normal limits Gait & Station: normal Patient leans: N/A  Psychiatric Specialty Exam: Physical Exam  Constitutional: She is oriented to person, place, and time. She appears well-developed and well-nourished.  HENT:  Head: Normocephalic and atraumatic.  Eyes: Conjunctivae and EOM are normal.  Neck:  Normal range of motion. Neck supple. No thyromegaly present.  Cardiovascular: Normal rate and regular rhythm.   Respiratory: Effort normal and breath sounds normal.  GI: Soft. She exhibits no distension.  Musculoskeletal: Normal range of motion.  Neurological: She is alert and oriented to person, place, and time.  Skin: Skin is warm.  Psychiatric: Her speech is normal. Her mood appears anxious. She is aggressive. Cognition and memory are normal. She expresses impulsivity. She exhibits a depressed mood. She expresses suicidal ideation.    Review of Systems  Psychiatric/Behavioral: Positive for depression and suicidal ideas. The patient is nervous/anxious and has insomnia.   All other systems reviewed and are negative.   Blood pressure 131/95, pulse 79, temperature 97.9 F (36.6 C), temperature source Oral, height $RemoveBefo'5\' 6"'bkOrTmgiRGM$  (1.676 m), weight 95.709 kg (211 lb), SpO2 97 %.  Body mass index is 34.07 kg/(m^2).  General Appearance: Fairly Groomed  Engineer, water::  Fair  Speech:  Clear and Coherent  Volume:  Normal  Mood:  Anxious and Depressed  Affect:  Labile  Thought Process:  Coherent  Orientation:  Full (Time, Place, and Person)  Thought Content:  Rumination and expressed AH while in ED  Suicidal Thoughts:  Yes.  with intent/plan- recurrent death wishes, recurrent SI , denies any at this time  Homicidal Thoughts:  No  Memory:  Immediate;   Fair Recent;   Fair Remote;   Fair  Judgement:  Impaired  Insight:  Fair  Psychomotor Activity:  Normal  Concentration:  Fair  Recall:  [employer] of Knowledge:Fair  Language: Fair  Akathisia:  No  Handed:  Right  AIMS (if indicated):     Assets:  Communication Skills Desire for Improvement  ADL's:  Intact  Cognition: WNL  Sleep:      Risk to Self:  yes Risk to Others:  did express HI in ED - denies it here Prior Inpatient Therapy:  Denies Prior Outpatient Therapy:  PMD   Alcohol Screening: Patient refused Alcohol Screening Tool:  Yes  Allergies:  No Known Allergies Lab Results:  Results for orders placed or performed during the hospital encounter of 05/06/15 (from the past 48 hour(s))  Acetaminophen level     Status: Abnormal   Collection Time: 05/06/15  6:48 PM  Result Value Ref Range   Acetaminophen (Tylenol), Serum <10 (L) 10 - 30 ug/mL    Comment:        THERAPEUTIC CONCENTRATIONS VARY SIGNIFICANTLY. A RANGE OF 10-30 ug/mL MAY BE AN EFFECTIVE CONCENTRATION FOR MANY PATIENTS. HOWEVER, SOME ARE BEST TREATED AT CONCENTRATIONS OUTSIDE THIS RANGE. ACETAMINOPHEN CONCENTRATIONS >150 ug/mL AT 4 HOURS AFTER INGESTION AND >50 ug/mL AT 12 HOURS AFTER INGESTION ARE OFTEN ASSOCIATED WITH TOXIC REACTIONS.   Ethanol (ETOH)     Status: None   Collection Time: 05/06/15  6:48 PM  Result Value Ref Range   Alcohol, Ethyl (B) <5 <5 mg/dL    Comment:        LOWEST DETECTABLE LIMIT FOR SERUM ALCOHOL IS 5 mg/dL FOR MEDICAL PURPOSES ONLY   Salicylate level     Status: None   Collection Time: 05/06/15  6:48 PM  Result Value Ref Range   Salicylate Lvl <9.3 2.8 - 30.0 mg/dL  CBC     Status: None   Collection Time: 05/06/15  6:50 PM  Result Value Ref Range   WBC 7.5 4.0 - 10.5 K/uL   RBC 4.45 3.87 - 5.11 MIL/uL   Hemoglobin 13.4 12.0 - 15.0 g/dL   HCT 40.7 36.0 - 46.0 %   MCV 91.5 78.0 - 100.0 fL   MCH 30.1 26.0 - 34.0 pg   MCHC 32.9 30.0 - 36.0 g/dL   RDW 13.1 11.5 - 15.5 %   Platelets 272 150 - 400 K/uL  Comprehensive metabolic panel     Status: None   Collection Time: 05/06/15  6:50 PM  Result Value Ref Range   Sodium 140 135 - 145 mmol/L   Potassium 3.8 3.5 - 5.1 mmol/L   Chloride 106 101 - 111 mmol/L   CO2 25 22 - 32 mmol/L   Glucose, Bld 94 65 - 99 mg/dL   BUN 11 6 - 20 mg/dL   Creatinine, Ser 0.67 0.44 - 1.00 mg/dL   Calcium 9.7 8.9 - 10.3 mg/dL   Total Protein 7.2 6.5 - 8.1  g/dL   Albumin 4.2 3.5 - 5.0 g/dL   AST 23 15 - 41 U/L   ALT 20 14 - 54 U/L   Alkaline Phosphatase 88 38 - 126 U/L   Total  Bilirubin 0.5 0.3 - 1.2 mg/dL   GFR calc non Af Amer >60 >60 mL/min   GFR calc Af Amer >60 >60 mL/min    Comment: (NOTE) The eGFR has been calculated using the CKD EPI equation. This calculation has not been validated in all clinical situations. eGFR's persistently <60 mL/min signify possible Chronic Kidney Disease.    Anion gap 9 5 - 15  Urine rapid drug screen (hosp performed)not at [facility]     Status: Abnormal   Collection Time: 05/06/15  6:51 PM  Result Value Ref Range   Opiates NONE DETECTED NONE DETECTED   Cocaine NONE DETECTED NONE DETECTED   Benzodiazepines POSITIVE (A) NONE DETECTED   Amphetamines NONE DETECTED NONE DETECTED   Tetrahydrocannabinol NONE DETECTED NONE DETECTED   Barbiturates NONE DETECTED NONE DETECTED    Comment:        DRUG SCREEN FOR MEDICAL PURPOSES ONLY.  IF CONFIRMATION IS NEEDED FOR ANY PURPOSE, NOTIFY LAB WITHIN 5 DAYS.        LOWEST DETECTABLE LIMITS FOR URINE DRUG SCREEN Drug Class       Cutoff (ng/mL) Amphetamine      1000 Barbiturate      200 Benzodiazepine   381 Tricyclics       829 Opiates          300 Cocaine          300 THC              50    Current Medications: Current Facility-Administered Medications  Medication Dose Route Frequency Provider Last Rate Last Dose  . acetaminophen (TYLENOL) tablet 650 mg  650 mg Oral Q6H PRN Ursula Alert, MD      . ALPRAZolam Duanne Moron) tablet 0.25 mg  0.25 mg Oral QHS PRN Ursula Alert, MD      . alum & mag hydroxide-simeth (MAALOX/MYLANTA) 200-200-20 MG/5ML suspension 30 mL  30 mL Oral Q4H PRN Ursula Alert, MD      . hydrOXYzine (ATARAX/VISTARIL) tablet 25 mg  25 mg Oral Q6H PRN Haydn Hutsell, MD      . magnesium hydroxide (MILK OF MAGNESIA) suspension 30 mL  30 mL Oral Daily PRN Carrah Eppolito, MD      . nicotine (NICODERM CQ - dosed in mg/24 hours) patch 14 mg  14 mg Transdermal Daily Jocsan Mcginley, MD      . sertraline (ZOLOFT) tablet 25 mg  25 mg Oral Q breakfast Edra Riccardi, MD      .  traZODone (DESYREL) tablet 50 mg  50 mg Oral QHS Rad Gramling, MD       PTA Medications: Prescriptions prior to admission  Medication Sig Dispense Refill Last Dose  . albuterol (PROVENTIL HFA;VENTOLIN HFA) 108 (90 BASE) MCG/ACT inhaler Inhale 2 puffs into the lungs every 6 (six) hours as needed for wheezing or shortness of breath. 6.7 g 1 Unknown at Unknown time  . albuterol (PROVENTIL) (5 MG/ML) 0.5% nebulizer solution Take 0.5 mLs (2.5 mg total) by nebulization every 6 (six) hours as needed for wheezing or shortness of breath (COPD). 20 mL 3 Unknown at Unknown time  . ALPRAZolam (XANAX) 0.5 MG tablet Take 0.5-1 mg by mouth at bedtime.    Unknown at Unknown time  . budesonide-formoterol (SYMBICORT) 160-4.5 MCG/ACT inhaler  Take 2 puffs first thing in am and then another 2 puffs about 12 hours later. 1 Inhaler 11 Unknown at Unknown time  . escitalopram (LEXAPRO) 20 MG tablet Take 20 mg by mouth daily.   Unknown at Unknown time  . metoprolol tartrate (LOPRESSOR) 25 MG tablet TAKE ONE TABLET BY MOUTH TWICE DAILY 60 tablet 1 Unknown at Unknown time  . SINGULAIR 10 MG tablet Take 10 mg by mouth at bedtime.    Unknown at Unknown time  . varenicline (CHANTIX PAK) 0.5 MG X 11 & 1 MG X 42 tablet Take one 0.5 mg tablet by mouth once daily for 3 days, then increase to one 0.5 mg tablet twice daily for 4 days, then increase to one 1 mg tablet twice daily. 53 tablet 0 Unknown at Unknown time    Previous Psychotropic Medications: Yes - lexapro,paxil  Substance Abuse History in the last 12 months:  Yes.  tobacco abuse severe    Consequences of Substance Abuse: Negative  Results for orders placed or performed during the hospital encounter of 05/06/15 (from the past 72 hour(s))  Acetaminophen level     Status: Abnormal   Collection Time: 05/06/15  6:48 PM  Result Value Ref Range   Acetaminophen (Tylenol), Serum <10 (L) 10 - 30 ug/mL    Comment:        THERAPEUTIC CONCENTRATIONS VARY SIGNIFICANTLY. A  RANGE OF 10-30 ug/mL MAY BE AN EFFECTIVE CONCENTRATION FOR MANY PATIENTS. HOWEVER, SOME ARE BEST TREATED AT CONCENTRATIONS OUTSIDE THIS RANGE. ACETAMINOPHEN CONCENTRATIONS >150 ug/mL AT 4 HOURS AFTER INGESTION AND >50 ug/mL AT 12 HOURS AFTER INGESTION ARE OFTEN ASSOCIATED WITH TOXIC REACTIONS.   Ethanol (ETOH)     Status: None   Collection Time: 05/06/15  6:48 PM  Result Value Ref Range   Alcohol, Ethyl (B) <5 <5 mg/dL    Comment:        LOWEST DETECTABLE LIMIT FOR SERUM ALCOHOL IS 5 mg/dL FOR MEDICAL PURPOSES ONLY   Salicylate level     Status: None   Collection Time: 05/06/15  6:48 PM  Result Value Ref Range   Salicylate Lvl <1.7 2.8 - 30.0 mg/dL  CBC     Status: None   Collection Time: 05/06/15  6:50 PM  Result Value Ref Range   WBC 7.5 4.0 - 10.5 K/uL   RBC 4.45 3.87 - 5.11 MIL/uL   Hemoglobin 13.4 12.0 - 15.0 g/dL   HCT 40.7 36.0 - 46.0 %   MCV 91.5 78.0 - 100.0 fL   MCH 30.1 26.0 - 34.0 pg   MCHC 32.9 30.0 - 36.0 g/dL   RDW 13.1 11.5 - 15.5 %   Platelets 272 150 - 400 K/uL  Comprehensive metabolic panel     Status: None   Collection Time: 05/06/15  6:50 PM  Result Value Ref Range   Sodium 140 135 - 145 mmol/L   Potassium 3.8 3.5 - 5.1 mmol/L   Chloride 106 101 - 111 mmol/L   CO2 25 22 - 32 mmol/L   Glucose, Bld 94 65 - 99 mg/dL   BUN 11 6 - 20 mg/dL   Creatinine, Ser 0.67 0.44 - 1.00 mg/dL   Calcium 9.7 8.9 - 10.3 mg/dL   Total Protein 7.2 6.5 - 8.1 g/dL   Albumin 4.2 3.5 - 5.0 g/dL   AST 23 15 - 41 U/L   ALT 20 14 - 54 U/L   Alkaline Phosphatase 88 38 - 126 U/L   Total Bilirubin 0.5  0.3 - 1.2 mg/dL   GFR calc non Af Amer >60 >60 mL/min   GFR calc Af Amer >60 >60 mL/min    Comment: (NOTE) The eGFR has been calculated using the CKD EPI equation. This calculation has not been validated in all clinical situations. eGFR's persistently <60 mL/min signify possible Chronic Kidney Disease.    Anion gap 9 5 - 15  Urine rapid drug screen (hosp performed)not  at [facility]     Status: Abnormal   Collection Time: 05/06/15  6:51 PM  Result Value Ref Range   Opiates NONE DETECTED NONE DETECTED   Cocaine NONE DETECTED NONE DETECTED   Benzodiazepines POSITIVE (A) NONE DETECTED   Amphetamines NONE DETECTED NONE DETECTED   Tetrahydrocannabinol NONE DETECTED NONE DETECTED   Barbiturates NONE DETECTED NONE DETECTED    Comment:        DRUG SCREEN FOR MEDICAL PURPOSES ONLY.  IF CONFIRMATION IS NEEDED FOR ANY PURPOSE, NOTIFY LAB WITHIN 5 DAYS.        LOWEST DETECTABLE LIMITS FOR URINE DRUG SCREEN Drug Class       Cutoff (ng/mL) Amphetamine      1000 Barbiturate      200 Benzodiazepine   540 Tricyclics       981 Opiates          300 Cocaine          300 THC              50     Observation Level/Precautions:  15 minute checks  Laboratory:  tsh, Reviewed CBC, CMP,UDS,BAL in ED  Psychotherapy:  Individual and group therapy   Medications:  As below  Consultations:  Education officer, museum  Discharge Concerns:  Stability and safety       Psychological Evaluations: No   Treatment Plan Summary: Daily contact with patient to assess and evaluate symptoms and progress in treatment and Medication management   Patient will benefit from inpatient treatment and stabilization.  Estimated length of stay is 5-7 days.  Reviewed past medical records,treatment plan.  Will start a trial of Zoloft 25 mg po daily for affective sx. Pt provided with medication education. Will add Trazodone 50 mg po qhs for sleep. Will taper off Xanax po qhs, reduce dose to 0.25 mg po. Will add Vistaril 25 mg po q6 h po prn for anxiety sx. Will restart home medications where needed. Patient with recurrent SI , currently denies any plan - will observe on the unit. Will continue to monitor vitals ,medication compliance and treatment side effects while patient is here.  Will monitor for medical issues as well as call consult as needed.  Reviewed labs ,will order as above. CSW will start  working on disposition.  Patient to participate in therapeutic milieu .       Medical Decision Making:  Review of Psycho-Social Stressors (1), Review or order clinical lab tests (1), Review of Last Therapy Session (1), Review of Medication Regimen & Side Effects (2) and Review of New Medication or Change in Dosage (2)  I certify that inpatient services furnished can reasonably be expected to improve the patient's condition.   Demarea Lorey MD 6/29/201612:36 PM

## 2015-05-07 NOTE — Progress Notes (Signed)
Adult Psychoeducational Group Note  Date:  05/07/2015 Time:  10:20 PM  Group Topic/Focus:  Wrap-Up Group:   The focus of this group is to help patients review their daily goal of treatment and discuss progress on daily workbooks.  Participation Level:  Active  Participation Quality:  Appropriate  Affect:  Appropriate  Cognitive:  Appropriate  Insight: Appropriate  Engagement in Group:  Engaged  Modes of Intervention:  Activity  Additional Comments:  Patient stated today being her first day and that she got a lot accomplished. She stated she talked with doctor and social worker to get everything in the right direction. Patient stated that she was not quite ready to attend groups earlier. Patient also stated that she had visitors and everyone was very supportive.   Salley Scarlet [facility] 05/07/2015, 10:20 PM

## 2015-05-07 NOTE — Progress Notes (Signed)
This encounter was created in error - please disregard.

## 2015-05-07 NOTE — Progress Notes (Signed)
Patient ID: Lori Owens, female   DOB: 1961-04-22, 54 y.o.   MRN: [id number] Pt states she is here because " I need medication management and my life is just really stressful right now. I am stressed by my boyfriend and mother who live with me and then I just had to quit my high paying job because I was given new diagnosis I can not remember and now trying to get disability.". Pt currently denies SI/HI/AVH although through report pt was noted having SI/HI and HI towards mother ans boyfriend. Pt rates depression at a 6, anxiety at a 9, and helplessness/hopelessness at a 3. Pt is pleasant and cooperative.

## 2015-05-07 NOTE — [facility] Group Notes (Signed)
[facility] Mental [facility] Association Group Therapy  05/07/2015 , 1:37 PM    Type of Therapy:  Mental Health Association Presentation  Participation Level: Invited.  Chose to not attend  Summary of Progress/Problems:  Lori Owens from [place] came to present his recovery story and play the guitar.    Lori Owens 05/07/2015 , 1:37 PM

## 2015-05-08 MED ORDER — TRAZODONE HCL 50 MG PO TABS
50.0000 mg | ORAL_TABLET | Freq: Every evening | ORAL | Status: DC | PRN
Start: 2015-05-08 — End: 2015-05-12
  Administered 2015-05-10: 50 mg via ORAL
  Filled 2015-05-08: qty 1

## 2015-05-08 MED ORDER — MONTELUKAST SODIUM 10 MG PO TABS
10.0000 mg | ORAL_TABLET | Freq: Every day | ORAL | Status: DC
  Administered 2015-05-08 – 2015-05-11 (×4): 10 mg via ORAL
  Filled 2015-05-08 (×6): qty 1

## 2015-05-08 MED ORDER — ALPRAZOLAM 0.25 MG PO TABS: 0.2500 mg | ORAL_TABLET | Freq: Every evening | ORAL | Status: DC | PRN

## 2015-05-08 NOTE — [facility] Suicide Risk Assessment (Signed)
[facility] INPATIENT:  Family/Significant Other Suicide Prevention Education  Suicide Prevention Education:  Education Completed; Faith Rogue, mother, [phone]  has been identified by the patient as the family member/significant other with whom the patient will be residing, and identified as the person(s) who will aid the patient in the event of a mental health crisis (suicidal ideations/suicide attempt).  With written consent from the patient, the family member/significant other has been provided the following suicide prevention education, prior to the and/or following the discharge of the patient.  The suicide prevention education provided includes the following:  Suicide risk factors  Suicide prevention and interventions  National Suicide Hotline telephone number  [facility] assessment telephone number  [facility] Emergency Assistance [place] and/or Residential Mobile Crisis Unit telephone number  Request made of family/significant other to:  Remove weapons (e.g., guns, rifles, knives), all items previously/currently identified as safety concern.    Remove drugs/medications (over-the-counter, prescriptions, illicit drugs), all items previously/currently identified as a safety concern.  The family member/significant other verbalizes understanding of the suicide prevention education information provided.  The family member/significant other agrees to remove the items of safety concern listed above.  Roque Lias B 05/08/2015, 5:30 PM

## 2015-05-08 NOTE — Progress Notes (Signed)
[facility] MD Progress Note  05/08/2015 11:29 AM Lori Owens  MRN:  [id number] Subjective: Patient states " I feel less depressed, I did not sleep well last night , but I am ok.'  Objective; Lori Owens is a 54 y.o. 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 Darrall Dears .  Patient seen and chart reviewed. Patient continues to make progress , reports being less depressed , less anxious today. She continues to have sleep issues - states that could be because she  Is making the transition from Xanax. Discussed with patient that I have made it PRN for her at a lower dose , if she needs it. Pt however wants to stop taking it all together and try to sleep on Trazodone. Pt with improvement of her recurrent SI . Tolerating Zoloft well. Denies ADRs .      Principal Problem: MDD (major depressive disorder), recurrent severe, without psychosis Diagnosis:   Patient Active Problem List   Diagnosis Date Noted  . MDD (major depressive disorder), recurrent severe, without psychosis [F33.2] 05/07/2015  . Tobacco use disorder [Z72.0] 05/07/2015  . Obesity [E66.9] 05/03/2015  . Excessive daytime sleepiness [G47.19] 03/03/2015  . Heart palpitations [R00.2] 12/24/2014  . Chest pain [R07.9] 12/23/2014  . Snoring [R06.83] 12/23/2014  . Witnessed apneic spells [R06.81] 12/23/2014  . Cardiomyopathy [I42.9] 09/19/2014  . Mild persistent asthma [J45.30] 09/03/2014  . COPD exacerbation [J44.1] 09/02/2014  . Suspicious mole [D22.9] 04/19/2013  . SOB (shortness of breath) [R06.02] 04/19/2013  . Hypokalemia [E87.6] 01/18/2013  . Dehydration [E86.0] 01/18/2013  . Tachycardia [R00.0] 01/18/2013   Total Time spent with patient: 30 minutes   Past Medical History:  Past Medical History  Diagnosis Date  . Depression 01/18/2013  . Tobacco abuse 01/18/2013  . Headache(784.0)   .  COPD (chronic obstructive pulmonary disease)   . Heart murmur   . Melanoma   . Seasonal allergies   . Hypertension     Past Surgical History  Procedure Laterality Date  . Throat surgery    . Abdominal hysterectomy    . Nose surgery      polyp removal from nasal cavity   . Tonsillectomy     Family History:  Family History  Problem Relation Age of Onset  . Diabetes Maternal Grandfather   . Heart disease Maternal Grandfather   . Cancer Maternal Grandfather   . Hypertension Maternal Grandfather    Social History:  History  Alcohol Use No     History  Drug Use No    History   Social History  . Marital Status: Single    Spouse Name: N/A  . Number of Children: N/A  . Years of Education: N/A   Social History Main Topics  . Smoking status: Current Every Day Smoker -- 0.50 packs/day  . Smokeless tobacco: Never Used  . Alcohol Use: No  . Drug Use: No  . Sexual Activity: No   Other Topics Concern  . None   Social History Narrative   ** Merged History Encounter **       Additional History:    Sleep: Poor  Appetite:  Fair     Musculoskeletal: Strength & Muscle Tone: within normal limits Gait & Station: normal Patient leans: N/A   Psychiatric Specialty Exam: Physical Exam  Review of Systems  Psychiatric/Behavioral: Positive for depression. The patient is nervous/anxious and  has insomnia.   All other systems reviewed and are negative.   Blood pressure 118/82, pulse 91, temperature 98.4 F (36.9 C), temperature source Oral, resp. rate 20, height 5\' 6"  (1.676 m), weight 95.709 kg (211 lb), SpO2 97 %.Body mass index is 34.07 kg/(m^2).  General Appearance: Fairly Groomed  Engineer, water::  Fair  Speech:  Normal Rate  Volume:  Normal  Mood:  Anxious and Depressed  Affect:  Congruent  Thought Process:  Linear  Orientation:  Full (Time, Place, and Person)  Thought Content:  Rumination  Suicidal Thoughts:  pt had recurrent intense urge to kill self on admission  - she reports improvement  Homicidal Thoughts:  No  Memory:  Immediate;   Fair Recent;   Fair Remote;   Fair  Judgement:  Fair  Insight:  Fair  Psychomotor Activity:  Normal  Concentration:  Fair  Recall:  [employer] of Knowledge:Fair  Language: Fair  Akathisia:  No  Handed:  Right  AIMS (if indicated):     Assets:  Communication Skills Desire for Improvement  ADL's:  Intact  Cognition: WNL  Sleep:  Number of Hours: 6.75     Current Medications: Current Facility-Administered Medications  Medication Dose Route Frequency Provider Last Rate Last Dose  . acetaminophen (TYLENOL) tablet 650 mg  650 mg Oral Q6H PRN Ursula Alert, MD   650 mg at 05/07/15 1548  . ALPRAZolam (XANAX) tablet 0.25 mg  0.25 mg Oral QHS PRN Ursula Alert, MD      . alum & mag hydroxide-simeth (MAALOX/MYLANTA) 200-200-20 MG/5ML suspension 30 mL  30 mL Oral Q4H PRN Ursula Alert, MD      . hydrOXYzine (ATARAX/VISTARIL) tablet 25 mg  25 mg Oral Q6H PRN Ursula Alert, MD   25 mg at 05/07/15 2121  . magnesium hydroxide (MILK OF MAGNESIA) suspension 30 mL  30 mL Oral Daily PRN Iam Lipson, MD      . montelukast (SINGULAIR) tablet 10 mg  10 mg Oral QHS Dennisha Mouser, MD      . nicotine (NICODERM CQ - dosed in mg/24 hours) patch 14 mg  14 mg Transdermal Daily Ursula Alert, MD   14 mg at 05/08/15 0820  . sertraline (ZOLOFT) tablet 25 mg  25 mg Oral Q breakfast Ursula Alert, MD   25 mg at 05/08/15 0820  . traZODone (DESYREL) tablet 50 mg  50 mg Oral QHS Ursula Alert, MD   50 mg at 05/07/15 2121  . traZODone (DESYREL) tablet 50 mg  50 mg Oral QHS PRN Ursula Alert, MD        Lab Results:  Results for orders placed or performed during the hospital encounter of 05/07/15 (from the past 48 hour(s))  TSH     Status: None   Collection Time: 05/07/15  7:33 PM  Result Value Ref Range   TSH 2.759 0.350 - 4.500 uIU/mL    Comment: Performed at [facility]    Physical Findings: AIMS:  Facial and Oral Movements Muscles of Facial Expression: None, normal Lips and Perioral Area: None, normal Jaw: None, normal Tongue: None, normal,Extremity Movements Upper (arms, wrists, hands, fingers): None, normal Lower (legs, knees, ankles, toes): None, normal, Trunk Movements Neck, shoulders, hips: None, normal, Overall Severity Severity of abnormal movements (highest score from questions above): None, normal Incapacitation due to abnormal movements: None, normal Patient's awareness of abnormal movements (rate only patient's report): No Awareness, Dental Status Current problems with teeth and/or dentures?: No Does patient usually wear  dentures?: No  CIWA:    COWS:     Assessment: Patient is a 10 y old CF who presented with worsening depression, SI with plan ( multiple) . Pt started on medications, continues to have sleep issues. Will continue medications.    Treatment Plan Summary: Daily contact with patient to assess and evaluate symptoms and progress in treatment and Medication management Will continue Zoloft 25 mg po daily for affective sx. Pt provided with medication education. Will continue Trazodone 50 mg po qhs for sleep. Add Trazodone 50 mh po qhs prn for sleep , if she needs it tonight. Will taper off Xanax po qhs, reduce dose to 0.25 mg po. Will add Vistaril 25 mg po q6 h po prn for anxiety sx. Will restart home medications where needed. Patient with recurrent SI , currently denies any plan - will observe on the unit. Will continue to monitor vitals ,medication compliance and treatment side effects while patient is here.  Will monitor for medical issues as well as call consult as needed.  Reviewed labs - TSH - wnl. CSW will start working on disposition.  Patient to participate in therapeutic milieu .     Medical Decision Making:  Review of Psycho-Social Stressors (1), Review or order clinical lab tests (1), Review of Last Therapy Session (1), Review of Medication  Regimen & Side Effects (2) and Review of New Medication or Change in Dosage (2)     Soren Pigman MD 05/08/2015, 11:29 AM

## 2015-05-08 NOTE — Progress Notes (Signed)
D  Telia has had a good day. She has been seen out in the milieu...interacting pleasantly and appropriately in the dayroom. She is seen laughing and joking ( nervously) with her peers. She takes all meds as scheduled and she ( per her request) is started on singulair tonight, per MD order.   A She completed her daily assessment and on it she worte she denied SI and she rated her depression, hopelessness an anxiety " 3/3/8", respectively.   R Safety in place.

## 2015-05-08 NOTE — Progress Notes (Signed)
Patient ID: Lori Owens, female   DOB: November 30, 1960, 54 y.o.   MRN: [id number] D: Client visiting with family this evening, noted mom needs hearing aid and need to get money from locker to assist. Client reports medication change working well, "getting everything straight" Client laughing and interacting appropriately with staff and peers. A: Writer introduced self to client, took client to locker to retrieve money, with security. Staff will monitor q73min for safety. R: Client is safe on the unit, retrieved $20.00 from belongings, amount verified and document with client, Probation officer, and security.

## 2015-05-08 NOTE — [facility] Group Notes (Addendum)
[place] Group Notes:  (Counselor/Nursing/MHT/Case Management/Adjunct)  05/08/2015 1:15PM  Type of Therapy:  Group Therapy  Participation Level:  Active  Participation Quality:  Appropriate  Affect:  Flat  Cognitive:  Oriented  Insight:  Improving  Engagement in Group:  Limited  Engagement in Therapy:  Limited  Modes of Intervention:  Discussion, Exploration and Socialization  Summary of Progress/Problems: The topic for group was balance in life.  Pt participated in the discussion about when their life was in balance and out of balance and how this feels.  Pt discussed ways to get back in balance and short term goals they can work on to get where they want to be. Stayed the entire time and was engaged throughout.  Talked at length about the support that she has gotten from peers here. "I know know I am not the only one struggling with depression, and I used to think it was just me.  It make me much more hopeful."  Talked about feeling balanced because she has no medical issues flaring up, and she is not smoking.  Talked bout her struggles with trying to quit in the past, and how she is determined to try it again.   Roque Lias B 05/08/2015 4:59 PM

## 2015-05-09 MED ORDER — TRAZODONE HCL 100 MG PO TABS
100.0000 mg | ORAL_TABLET | Freq: Every day | ORAL | Status: DC
  Administered 2015-05-09 – 2015-05-11 (×3): 100 mg via ORAL
  Filled 2015-05-09 (×6): qty 1
  Filled 2015-05-09: qty 3

## 2015-05-09 NOTE — Progress Notes (Signed)
[facility] MD Progress Note  05/09/2015 12:39 PM Lori Owens  MRN:  [id number] Subjective: Patient states " I need help with my sleep."   Objective; Lori Owens is a 54 y.o. 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 Darrall Dears .  Patient seen and chart reviewed. Patient today has sleep issues , pt also seems really distressed about her sleep pattern. Pt has been trying to not continue on the xanax , which was prescribed to her by her PMD for sleep. Pt was started on Trazodone here . This transition could be making her more distressed. Pt reports depression as well as anxiety as improving. She is tolerating her Zoloft well . Pt denies any ADRs of medications.  Pt has been attending groups . Pt on presentation also had recurrent death wish as well as multiple suicide plans - pt reports reductions of her thoughts .  Pt encouraged to attend groups and take medications .      Principal Problem: MDD (major depressive disorder), recurrent severe, without psychosis Diagnosis:   Patient Active Problem List   Diagnosis Date Noted  . MDD (major depressive disorder), recurrent severe, without psychosis [F33.2] 05/07/2015  . Tobacco use disorder [Z72.0] 05/07/2015  . Obesity [E66.9] 05/03/2015  . Excessive daytime sleepiness [G47.19] 03/03/2015  . Heart palpitations [R00.2] 12/24/2014  . Chest pain [R07.9] 12/23/2014  . Snoring [R06.83] 12/23/2014  . Witnessed apneic spells [R06.81] 12/23/2014  . Cardiomyopathy [I42.9] 09/19/2014  . Mild persistent asthma [J45.30] 09/03/2014  . COPD exacerbation [J44.1] 09/02/2014  . Suspicious mole [D22.9] 04/19/2013  . SOB (shortness of breath) [R06.02] 04/19/2013  . Hypokalemia [E87.6] 01/18/2013  . Dehydration [E86.0] 01/18/2013  . Tachycardia [R00.0] 01/18/2013   Total Time spent with patient: 30 minutes   Past Medical  History:  Past Medical History  Diagnosis Date  . Depression 01/18/2013  . Tobacco abuse 01/18/2013  . Headache(784.0)   . COPD (chronic obstructive pulmonary disease)   . Heart murmur   . Melanoma   . Seasonal allergies   . Hypertension     Past Surgical History  Procedure Laterality Date  . Throat surgery    . Abdominal hysterectomy    . Nose surgery      polyp removal from nasal cavity   . Tonsillectomy     Family History:  Family History  Problem Relation Age of Onset  . Diabetes Maternal Grandfather   . Heart disease Maternal Grandfather   . Cancer Maternal Grandfather   . Hypertension Maternal Grandfather    Social History:  History  Alcohol Use No     History  Drug Use No    History   Social History  . Marital Status: Single    Spouse Name: N/A  . Number of Children: N/A  . Years of Education: N/A   Social History Main Topics  . Smoking status: Current Every Day Smoker -- 0.50 packs/day  . Smokeless tobacco: Never Used  . Alcohol Use: No  . Drug Use: No  . Sexual Activity: No   Other Topics Concern  . None   Social History Narrative   ** Merged History Encounter **       Additional History:    Sleep: Poor  Appetite:  Fair     Musculoskeletal: Strength & Muscle Tone: within normal limits Gait & Station: normal Patient leans: N/A  Psychiatric Specialty Exam: Physical Exam  Review of Systems  Psychiatric/Behavioral: Positive for depression. The patient is nervous/anxious and has insomnia.   All other systems reviewed and are negative.   Blood pressure 116/82, pulse 83, temperature 97.9 F (36.6 C), temperature source Oral, resp. rate 16, height 5\' 6"  (1.676 m), weight 95.709 kg (211 lb), SpO2 97 %.Body mass index is 34.07 kg/(m^2).  General Appearance: Fairly Groomed  Engineer, water::  Fair  Speech:  Normal Rate  Volume:  Normal  Mood:  Anxious and Depressed  Affect:  Congruent  Thought Process:  Linear  Orientation:  Full (Time,  Place, and Person)  Thought Content:  Rumination  Suicidal Thoughts:  pt had recurrent intense urge to kill self on admission - she reports improvement  Homicidal Thoughts:  No  Memory:  Immediate;   Fair Recent;   Fair Remote;   Fair  Judgement:  Fair  Insight:  Fair  Psychomotor Activity:  Normal  Concentration:  Fair  Recall:  [employer] of Knowledge:Fair  Language: Fair  Akathisia:  No  Handed:  Right  AIMS (if indicated):     Assets:  Communication Skills Desire for Improvement  ADL's:  Intact  Cognition: WNL  Sleep:  Number of Hours: 6.75     Current Medications: Current Facility-Administered Medications  Medication Dose Route Frequency Provider Last Rate Last Dose  . acetaminophen (TYLENOL) tablet 650 mg  650 mg Oral Q6H PRN Ursula Alert, MD   650 mg at 05/07/15 1548  . ALPRAZolam (XANAX) tablet 0.25 mg  0.25 mg Oral QHS PRN Ursula Alert, MD      . alum & mag hydroxide-simeth (MAALOX/MYLANTA) 200-200-20 MG/5ML suspension 30 mL  30 mL Oral Q4H PRN Ursula Alert, MD      . hydrOXYzine (ATARAX/VISTARIL) tablet 25 mg  25 mg Oral Q6H PRN Ursula Alert, MD   25 mg at 05/07/15 2121  . magnesium hydroxide (MILK OF MAGNESIA) suspension 30 mL  30 mL Oral Daily PRN Keya Wynes, MD      . montelukast (SINGULAIR) tablet 10 mg  10 mg Oral QHS Ursula Alert, MD   10 mg at 05/08/15 2134  . nicotine (NICODERM CQ - dosed in mg/24 hours) patch 14 mg  14 mg Transdermal Daily Ursula Alert, MD   14 mg at 05/09/15 0747  . sertraline (ZOLOFT) tablet 25 mg  25 mg Oral Q breakfast Ursula Alert, MD   25 mg at 05/09/15 0746  . traZODone (DESYREL) tablet 100 mg  100 mg Oral QHS Ursula Alert, MD      . traZODone (DESYREL) tablet 50 mg  50 mg Oral QHS PRN Ursula Alert, MD        Lab Results:  Results for orders placed or performed during the hospital encounter of 05/07/15 (from the past 48 hour(s))  TSH     Status: None   Collection Time: 05/07/15  7:33 PM  Result Value Ref  Range   TSH 2.759 0.350 - 4.500 uIU/mL    Comment: Performed at [facility]    Physical Findings: AIMS: Facial and Oral Movements Muscles of Facial Expression: None, normal Lips and Perioral Area: None, normal Jaw: None, normal Tongue: None, normal,Extremity Movements Upper (arms, wrists, hands, fingers): None, normal Lower (legs, knees, ankles, toes): None, normal, Trunk Movements Neck, shoulders, hips: None, normal, Overall Severity Severity of abnormal movements (highest score from questions above): None, normal Incapacitation due to abnormal movements: None, normal Patient's awareness of abnormal movements (  rate only patient's report): No Awareness, Dental Status Current problems with teeth and/or dentures?: No Does patient usually wear dentures?: No  CIWA:    COWS:     Assessment: Patient is a 65 y old CF who presented with worsening depression, SI with plan ( multiple) . Pt started on medications, continues to have sleep issues. Will continue medications.    Treatment Plan Summary: Daily contact with patient to assess and evaluate symptoms and progress in treatment and Medication management Will continue Zoloft 25 mg po daily for affective sx. Pt provided with medication education. Will increase Trazodone to 100 mg po qhs , repeat 50 mg po qhs prn if she has sleep trouble . Will taper off Xanax po qhs, reduce dose to 0.25 mg po.Pt has not been using any xanax since admission. Will add Vistaril 25 mg po q6 h po prn for anxiety sx. Will restart home medications where needed. Patient with recurrent SI , currently denies any plan - will observe on the unit. Will continue to monitor vitals ,medication compliance and treatment side effects while patient is here.  Will monitor for medical issues as well as call consult as needed.  Reviewed labs - TSH - wnl. CSW will start working on disposition.  Patient to participate in therapeutic milieu .      Medical Decision Making:  Review of Psycho-Social Stressors (1), Review or order clinical lab tests (1), Review of Last Therapy Session (1), Review of Medication Regimen & Side Effects (2) and Review of New Medication or Change in Dosage (2)     Dorethy Tomey MD 05/09/2015, 12:39 PM

## 2015-05-09 NOTE — Plan of Care (Signed)
Problem: Alteration in mood Goal: LTG-Pt's behavior demonstrates decreased signs of depression (Patient's behavior demonstrates decreased signs of depression to the point the patient is safe to return home and continue treatment in an outpatient setting)  Outcome: Progressing Client demonstrates decreased signs of depression AEB affects that brighten on approach, laughing and interacting with peers. Client also participated in [place] by singing with peers.

## 2015-05-09 NOTE — Progress Notes (Signed)
Writer has observed patient up and active on the unit, Writer spoke with her 1:1 and she reports having had a good day. She attended group this evening and participated. She is hopeful to discharge on Monday. She is compliant with scheduled medications and her concern is that she will sleep tonight because she reports not sleeping well the previous night. Support and encouragement given, safety maintained on unit with 15 min checks.

## 2015-05-09 NOTE — Progress Notes (Signed)
D Karie has ahd a much better day today. She is animated. Relaxed. Says she knows the " routines" now and feels more " at home". She takes her medications as scheduled and she denies SI. She completes her daily assessment and on it she wrote she denied SI and she rated her depression, hopelessness and anxiety " 0/0/0", respectively.    A She is scheduled to be in the hospital through the weekend with possible DC on Monday.    R POC in place.

## 2015-05-09 NOTE — [facility] Group Notes (Signed)
[facility] LCSW Aftercare Discharge Planning Group Note   05/09/2015 10:54 AM  Participation Quality:  Engaged Mood/Affect:  Appropriate  Depression Rating:  2 Anxiety Rating:  2  Thoughts of Suicide:  No Will you contract for safety?   NA  Current AVH:  No  Plan for Discharge/Comments:  Other than poor sleep, no complaints.  "I'm ready to go anytime."  Went to [employer] last night and enjoyed the mood-joined a chorus.  Mood good.    Transportation Means: family  Supports: family  [place], Ernestine Mcmurray

## 2015-05-09 NOTE — Progress Notes (Signed)
[facility] Group Notes:  (Nursing/MHT/Case Management/Adjunct)  Date:  05/09/2015  Time:  10:55 PM  Type of Therapy:  Psychoeducational Skills  Participation Level:  Active  Participation Quality:  Appropriate  Affect:  Appropriate  Cognitive:  Appropriate  Insight:  Good  Engagement in Group:  Engaged  Modes of Intervention:  Education  Summary of Progress/Problems: Patient shared with the group that she had a good day since she had more success with talking to her peers and because she was able to watch television. In terms of the theme for the day, her relapse prevention will include changing her routine so as to abstain from smoking.   Archie Balboa S 05/09/2015, 10:55 PM

## 2015-05-09 NOTE — BHH Group Notes (Signed)
[facility] LCSW Group Therapy 05/09/2015 1:15 PM  Type of Therapy: Group Therapy  Participation Level: Active  Participation Quality: Attentive, Sharing and Supportive  Affect: Depressed and Flat  Cognitive: Alert and Oriented  Insight: Developing/Improving and Engaged  Engagement in Therapy: Developing/Improving and Engaged  Modes of Intervention: Clarification, Confrontation, Discussion, Education, Exploration, Limit-setting, Orientation, Problem-solving, Rapport Building, Art therapist, Socialization and Support  Summary of Progress/Problems: The topic for today was feelings about relapse. Pt discussed what recovery is to them and identified triggers that they are on the path to relapse. Pt processed their feeling towards relapse and was able to relate to peers. Pt discussed coping skills that can be used for relapse prevention. Patient identified w peer's discussion of taking on too much responsibility for family members, identified "doing my son's hair" as a task she would like son to take care of himself at times.  Identified her sister as support in change process.  Edwyna Shell, LCSW Clinical Social Worker

## 2015-05-10 MED ORDER — NICOTINE 21 MG/24HR TD PT24
MEDICATED_PATCH | TRANSDERMAL | Status: AC
  Filled 2015-05-10: qty 1

## 2015-05-10 NOTE — Progress Notes (Signed)
Patient ID: Lori Owens, female   DOB: 1961/04/26, 54 y.o.   MRN: [id number] [facility] MD Progress Note  05/10/2015 3:52 PM Lori Owens  MRN:  [id number] Subjective: Patient states "I am doing better. I have learned to take care of myself as well as everyone else. I am doing better on Zoloft. The Lexapro caused me emotional problems. I feel refreshed since coming here. Sometimes you do not know that you are getting so wore down. Especially when you are the one to hold a family together."   Objective; Lori Owens is a 54 y.o. White female , divorced , self employed - has [employer], [employer] , has a hx of depression, who voluntarily presented to [facility] for psych eval at the request of her physician-Dr. Darrall Dears. Patient seen and chart reviewed. Patient reporting improved sleep with Trazodone.  Pt has been trying to not continue on the xanax , which was prescribed to her by her PMD for sleep. Pt reports depression as well as anxiety as improving.She is tolerating her Zoloft well . Pt denies any ADRs of medications. Pt has been attending groups . Pt on presentation also had recurrent death wish as well as multiple suicide plans - pt reports reductions of her thoughts. Today the patient is denying any suicidal thoughts today and appears to be future oriented. She is actively attending groups and remains complaint with medications. Is denying any adverse effects. Patient reports the suicidal thoughts stopped once her antidepressant was changed.   Principal Problem: MDD (major depressive disorder), recurrent severe, without psychosis Diagnosis:   Patient Active Problem List   Diagnosis Date Noted  . MDD (major depressive disorder), recurrent severe, without psychosis [F33.2] 05/07/2015  . Tobacco use disorder [Z72.0] 05/07/2015  . Obesity [E66.9] 05/03/2015  . Excessive daytime sleepiness [G47.19] 03/03/2015  . Heart palpitations [R00.2] 12/24/2014  . Chest pain [R07.9] 12/23/2014  . Snoring [R06.83]  12/23/2014  . Witnessed apneic spells [R06.81] 12/23/2014  . Cardiomyopathy [I42.9] 09/19/2014  . Mild persistent asthma [J45.30] 09/03/2014  . COPD exacerbation [J44.1] 09/02/2014  . Suspicious mole [D22.9] 04/19/2013  . SOB (shortness of breath) [R06.02] 04/19/2013  . Hypokalemia [E87.6] 01/18/2013  . Dehydration [E86.0] 01/18/2013  . Tachycardia [R00.0] 01/18/2013   Total Time spent with patient: 20 minutes  Past Medical History:  Past Medical History  Diagnosis Date  . Depression 01/18/2013  . Tobacco abuse 01/18/2013  . Headache(784.0)   . COPD (chronic obstructive pulmonary disease)   . Heart murmur   . Melanoma   . Seasonal allergies   . Hypertension     Past Surgical History  Procedure Laterality Date  . Throat surgery    . Abdominal hysterectomy    . Nose surgery      polyp removal from nasal cavity   . Tonsillectomy     Family History:  Family History  Problem Relation Age of Onset  . Diabetes Maternal Grandfather   . Heart disease Maternal Grandfather   . Cancer Maternal Grandfather   . Hypertension Maternal Grandfather    Social History:  History  Alcohol Use No     History  Drug Use No    History   Social History  . Marital Status: Single    Spouse Name: N/A  . Number of Children: N/A  . Years of Education: N/A   Social History Main Topics  . Smoking status: Current Every Day Smoker -- 0.50 packs/day  . Smokeless tobacco: Never Used  . Alcohol Use: No  .  Drug Use: No  . Sexual Activity: No   Other Topics Concern  . None   Social History Narrative   ** Merged History Encounter **       Additional History:    Sleep: Good  Appetite:  Fair  Musculoskeletal: Strength & Muscle Tone: within normal limits Gait & Station: normal Patient leans: N/A  Psychiatric Specialty Exam: Physical Exam  Review of Systems  Constitutional: Negative.   HENT: Negative.   Eyes: Negative.   Respiratory: Negative.   Cardiovascular: Negative.    Gastrointestinal: Negative.   Genitourinary: Negative.   Musculoskeletal: Negative.   Skin: Negative.   Neurological: Negative.   Endo/Heme/Allergies: Negative.   Psychiatric/Behavioral: Positive for depression. Negative for suicidal ideas, hallucinations, memory loss and substance abuse. The patient is nervous/anxious and has insomnia.   All other systems reviewed and are negative.   Blood pressure 102/84, pulse 98, temperature 97.9 F (36.6 C), temperature source Oral, resp. rate 16, height 5\' 6"  (1.676 m), weight 95.709 kg (211 lb), SpO2 97 %.Body mass index is 34.07 kg/(m^2).  General Appearance: Fairly Groomed  Engineer, water::  Fair  Speech:  Normal Rate  Volume:  Normal  Mood:  Anxious and Depressed  Affect:  Congruent  Thought Process:  Linear  Orientation:  Full (Time, Place, and Person)  Thought Content:  Rumination  Suicidal Thoughts:  No  Homicidal Thoughts:  No  Memory:  Immediate;   Fair Recent;   Fair Remote;   Fair  Judgement:  Fair  Insight:  Fair  Psychomotor Activity:  Normal  Concentration:  Fair  Recall:  [employer] of Knowledge:Fair  Language: Fair  Akathisia:  No  Handed:  Right  AIMS (if indicated):     Assets:  Communication Skills Desire for Improvement  ADL's:  Intact  Cognition: WNL  Sleep:  Number of Hours: 6.25     Current Medications: Current Facility-Administered Medications  Medication Dose Route Frequency Provider Last Rate Last Dose  . acetaminophen (TYLENOL) tablet 650 mg  650 mg Oral Q6H PRN Ursula Alert, MD   650 mg at 05/07/15 1548  . ALPRAZolam (XANAX) tablet 0.25 mg  0.25 mg Oral QHS PRN Ursula Alert, MD      . alum & mag hydroxide-simeth (MAALOX/MYLANTA) 200-200-20 MG/5ML suspension 30 mL  30 mL Oral Q4H PRN Ursula Alert, MD      . hydrOXYzine (ATARAX/VISTARIL) tablet 25 mg  25 mg Oral Q6H PRN Ursula Alert, MD   25 mg at 05/09/15 2112  . magnesium hydroxide (MILK OF MAGNESIA) suspension 30 mL  30 mL Oral Daily PRN  Saramma Eappen, MD      . montelukast (SINGULAIR) tablet 10 mg  10 mg Oral QHS Saramma Eappen, MD   10 mg at 05/09/15 2112  . nicotine (NICODERM CQ - dosed in mg/24 hours) patch 14 mg  14 mg Transdermal Daily Saramma Eappen, MD   14 mg at 05/10/15 0800  . sertraline (ZOLOFT) tablet 25 mg  25 mg Oral Q breakfast Ursula Alert, MD   25 mg at 05/10/15 0832  . traZODone (DESYREL) tablet 100 mg  100 mg Oral QHS Ursula Alert, MD   100 mg at 05/09/15 2112  . traZODone (DESYREL) tablet 50 mg  50 mg Oral QHS PRN Ursula Alert, MD        Lab Results:  No results found for this or any previous visit (from the past 48 hour(s)).  Physical Findings: AIMS: Facial and Oral Movements Muscles of Facial  Expression: None, normal Lips and Perioral Area: None, normal Jaw: None, normal Tongue: None, normal,Extremity Movements Upper (arms, wrists, hands, fingers): None, normal Lower (legs, knees, ankles, toes): None, normal, Trunk Movements Neck, shoulders, hips: None, normal, Overall Severity Severity of abnormal movements (highest score from questions above): None, normal Incapacitation due to abnormal movements: None, normal Patient's awareness of abnormal movements (rate only patient's report): No Awareness, Dental Status Current problems with teeth and/or dentures?: No Does patient usually wear dentures?: No  CIWA:    COWS:     Assessment: Patient is a 16 y old CF who presented with worsening depression, SI with plan ( multiple) . Pt started on medications, sleep has improved with Trazodone. Will continue medications.  Treatment Plan Summary: Daily contact with patient to assess and evaluate symptoms and progress in treatment and Medication management Will continue Zoloft 25 mg po daily for affective sx. Pt provided with medication education. Will continue Trazodone to 100 mg po qhs , repeat 50 mg po qhs prn if she has sleep trouble . Will taper off Xanax po qhs, have reduced dose to 0.25 mg po  prn. Pt has not been using any xanax since admission. Will continue Vistaril 25 mg po q6 h po prn for anxiety sx. Will restart home medications where needed. Patient with recurrent SI , currently denies any plan - will observe on the unit. Will continue to monitor vitals ,medication compliance and treatment side effects while patient is here.  Will monitor for medical issues as well as call consult as needed.  Reviewed labs - TSH - wnl. CSW will start working on disposition.  Patient to participate in therapeutic milieu .   Medical Decision Making:  Review of Psycho-Social Stressors (1), Review or order clinical lab tests (1), Review of Last Therapy Session (1), Review of Medication Regimen & Side Effects (2) and Review of New Medication or Change in Dosage (2)  DAVIS, LAURA, NP-C 05/10/2015, 3:52 PM I agree with assessment and plan Geralyn Flash A. Sabra Heck, M.D.

## 2015-05-10 NOTE — Progress Notes (Signed)
Psychoeducational Group Note  Date:  06/17/2012 Time: 1015  Group Topic/Focus:  Identifying Needs:   The focus of this group is to help patients identify their personal needs that have been historically problematic and identify healthy behaviors to address their needs.  Participation Level:Active    Participation Quality Active  Affect: flat  Cognitive:  Intact  Insight:  good  Engagement in Group: engaged  Additional Comments:

## 2015-05-10 NOTE — Progress Notes (Signed)
Psychoeducational Group Note  Date:  06/17/2012 Time: 1015  Group Topic/Focus:  Identifying Needs:   The focus of this group is to help patients identify their personal  Goals as well as identify skills needed to accomplish these goals.    Participation Level:Active    Participation Quality: Active  Affect: Flat  Cognitive:  Intact  Insight:INtact  Engagement in Group:  Additional Comments:

## 2015-05-10 NOTE — Progress Notes (Signed)
Writer has observed patient up in the dayroom interacting appropriately with peers. She had a visit from her family earlier this evening. She has attended group, compliant with medications and denies si/hi/a/v hallucinations. She is awaiting her discharge on Monday. Support and encouragement given, safety maintained on unit with 15 min checks.

## 2015-05-10 NOTE — Plan of Care (Signed)
Problem: Diagnosis: Increased Risk For Suicide Attempt Goal: STG-Patient Will Attend All Groups On The Unit Outcome: Progressing Patient attended evening wrap up group.

## 2015-05-10 NOTE — Plan of Care (Signed)
Problem: Diagnosis: Increased Risk For Suicide Attempt Goal: LTG-Patient Will Report Improved Mood and Deny Suicidal LTG (by discharge) Patient will report improved mood and deny suicidal ideation.  Outcome: Progressing Patient currently denies suicidal ideations.

## 2015-05-10 NOTE — Progress Notes (Addendum)
Lori Owens is feeling better and stronger today. She is seen [facility]...she interacts appropriately, she speaks with staff as well as other patients and she is animated, she articulates her needs to staff well and she is engaged in the group conversation. She completes her daily assessment and on it she rates her depression, anxiety and hopelessness " 0/0/0", respectively and she denies feeling SI today. She is engaged in her [place] group and is working on Soil scientist.DC planned for Mon.   A Safety in place and poc contd.

## 2015-05-10 NOTE — [facility] Group Notes (Signed)
[place] LCSW Group Therapy Note  05/10/2015 1:15 PM  Type of Therapy and Topic:  Group Therapy: Avoiding Self-Sabotaging and Enabling Behaviors  Participation Level:  Active   Description of Group:   Learn how to identify obstacles, self-sabotaging and enabling behaviors, what are they, why do we do them and what needs do these behaviors meet? Discuss unhealthy relationships and how to have positive healthy boundaries with those that sabotage and enable. Explore aspects of self-sabotage and enabling in yourself and how to limit these self-destructive behaviors in everyday life.  Therapeutic Goals: 1. Patient will identify one obstacle that relates to self-sabotage and enabling behaviors 2. Patient will identify one personal self-sabotaging or enabling behavior they did prior to admission 3. Patient able to establish a plan to change the above identified behavior they did prior to admission:  4. Patient will demonstrate ability to communicate their needs through discussion and/or role plays.   Summary of Patient Progress: The main focus of today's process group was to explain to the adolescent what "self-sabotage" means and use Motivational Interviewing to discuss what benefits, negative or positive, were involved in a self-identified self-sabotaging behavior. We then talked about reasons the patient may want to change the behavior and their current desire to change. Patient was called out to meet with medical staff but engaged easily upon return. She  identified several self sabotaging behaviors she engages in and is motivated to reduce her smoking and isolation. She reports she plans to use her new bike to get out of the house and coloring to relieve stress.   Therapeutic Modalities:   Cognitive Behavioral Therapy Person-Centered Therapy Motivational Interviewing   Sheilah Pigeon, LCSW

## 2015-05-10 NOTE — Plan of Care (Signed)
Problem: Diagnosis: Increased Risk For Suicide Attempt Goal: STG-Patient Will Comply With Medication Regime Outcome: Progressing Patient is compliant with scheduled medications.     

## 2015-05-10 NOTE — Progress Notes (Signed)
[place] Group Notes:  (Nursing/MHT/Case Management/Adjunct)  Date:  05/10/2015  Time:  10:33 PM  Type of Therapy:  Psychoeducational Skills  Participation Level:  Active  Participation Quality:  Appropriate  Affect:  Appropriate  Cognitive:  Appropriate  Insight:  Appropriate  Engagement in Group:  Lacking  Modes of Intervention:  Education  Summary of Progress/Problems: Patient states that she had a good visit with her mother and boyfriend this evening. Her coping skill (theme for the day) will be to "delegate" the work in her life.   Archie Balboa S 05/10/2015, 10:33 PM

## 2015-05-11 MED ORDER — NICOTINE 21 MG/24HR TD PT24
MEDICATED_PATCH | TRANSDERMAL | Status: AC
  Filled 2015-05-11: qty 1

## 2015-05-11 MED ORDER — NICOTINE 21 MG/24HR TD PT24
21.0000 mg | MEDICATED_PATCH | Freq: Every day | TRANSDERMAL | Status: DC
  Administered 2015-05-10: 09:00:00 via TRANSDERMAL
  Administered 2015-05-11 – 2015-05-12 (×2): 21 mg via TRANSDERMAL
  Filled 2015-05-11 (×2): qty 1
  Filled 2015-05-11: qty 5
  Filled 2015-05-11: qty 1

## 2015-05-11 NOTE — [facility] Group Notes (Signed)
[place] Group Notes:  (Nursing/MHT/Case Management/Adjunct)  Date:  05/11/2015  Time:  12:22 PM  Type of Therapy:  Psychoeducational Skills  Participation Level:  Active  Participation Quality:  Appropriate  Affect:  Appropriate  Cognitive:  Alert  Insight:  Good  Engagement in Group:  Engaged  Modes of Intervention:  Education  Summary of Progress/Problems:  Lori Owens 05/11/2015, 12:22 PM

## 2015-05-11 NOTE — Progress Notes (Signed)
[facility] MD Progress Note  05/11/2015 4:59 PM Lori Owens  MRN:  [id number] Subjective: Patient states "I am doing better.  I am doing better on Zoloft. The Lexapro caused me emotional problems. I feel refreshed since coming here. Sometimes you do not know that you are getting so wore down. Especially when you are the one to hold a family together."   Objective:  Patient was seen today.  She is upbeat and very talkative.  She is not endorsing AVH/SI/HI.  No reports of adverse drug reaction.  She has a hx of depression, who voluntarily presented to [facility] for psych eval at the request of her physician-Dr. Darrall Dears. Patient seen and chart reviewed. Patient reporting improved sleep with Trazodone.  Pt has been trying to not continue on the xanax , which was prescribed to her by her PMD for sleep. Pt reports depression as well as anxiety as improving.She is tolerating her Zoloft well . Pt denies any ADRs of medications. Pt has been attending groups . Pt on presentation also had recurrent death wish as well as multiple suicide plans - pt reports reductions of her thoughts. Today the patient is denying any suicidal thoughts today and appears to be future oriented. She is actively attending groups and remains complaint with medications. Is denying any adverse effects. Patient reports the suicidal thoughts stopped once her antidepressant was changed.   Principal Problem: MDD (major depressive disorder), recurrent severe, without psychosis Diagnosis:   Patient Active Problem List   Diagnosis Date Noted  . MDD (major depressive disorder), recurrent severe, without psychosis [F33.2] 05/07/2015  . Tobacco use disorder [Z72.0] 05/07/2015  . Obesity [E66.9] 05/03/2015  . Excessive daytime sleepiness [G47.19] 03/03/2015  . Heart palpitations [R00.2] 12/24/2014  . Chest pain [R07.9] 12/23/2014  . Snoring [R06.83] 12/23/2014  . Witnessed apneic spells [R06.81] 12/23/2014  . Cardiomyopathy [I42.9] 09/19/2014  . Mild  persistent asthma [J45.30] 09/03/2014  . COPD exacerbation [J44.1] 09/02/2014  . Suspicious mole [D22.9] 04/19/2013  . SOB (shortness of breath) [R06.02] 04/19/2013  . Hypokalemia [E87.6] 01/18/2013  . Dehydration [E86.0] 01/18/2013  . Tachycardia [R00.0] 01/18/2013   Total Time spent with patient: 20 minutes  Past Medical History:  Past Medical History  Diagnosis Date  . Depression 01/18/2013  . Tobacco abuse 01/18/2013  . Headache(784.0)   . COPD (chronic obstructive pulmonary disease)   . Heart murmur   . Melanoma   . Seasonal allergies   . Hypertension     Past Surgical History  Procedure Laterality Date  . Throat surgery    . Abdominal hysterectomy    . Nose surgery      polyp removal from nasal cavity   . Tonsillectomy     Family History:  Family History  Problem Relation Age of Onset  . Diabetes Maternal Grandfather   . Heart disease Maternal Grandfather   . Cancer Maternal Grandfather   . Hypertension Maternal Grandfather    Social History:  History  Alcohol Use No     History  Drug Use No    History   Social History  . Marital Status: Single    Spouse Name: N/A  . Number of Children: N/A  . Years of Education: N/A   Social History Main Topics  . Smoking status: Current Every Day Smoker -- 0.50 packs/day  . Smokeless tobacco: Never Used  . Alcohol Use: No  . Drug Use: No  . Sexual Activity: No   Other Topics Concern  . None   Social  History Narrative   ** Merged History Encounter **       Additional History:    Sleep: Good  Appetite:  Fair  Musculoskeletal: Strength & Muscle Tone: within normal limits Gait & Station: normal Patient leans: N/A  Psychiatric Specialty Exam: Physical Exam  Review of Systems  Constitutional: Negative.   HENT: Negative.   Eyes: Negative.   Respiratory: Negative.   Cardiovascular: Negative.   Gastrointestinal: Negative.   Genitourinary: Negative.   Musculoskeletal: Negative.   Skin: Negative.    Neurological: Negative.   Endo/Heme/Allergies: Negative.   Psychiatric/Behavioral: Positive for depression. Negative for suicidal ideas, hallucinations, memory loss and substance abuse. The patient is nervous/anxious and has insomnia.   All other systems reviewed and are negative.   Blood pressure 110/72, pulse 105, temperature 98 F (36.7 C), temperature source Oral, resp. rate 16, height 5\' 6"  (1.676 m), weight 95.709 kg (211 lb), SpO2 97 %.Body mass index is 34.07 kg/(m^2).  General Appearance: Fairly Groomed  Engineer, water::  Fair  Speech:  Normal Rate  Volume:  Normal  Mood:  Anxious and Depressed  Affect:  Congruent  Thought Process:  Linear  Orientation:  Full (Time, Place, and Person)  Thought Content:  Rumination  Suicidal Thoughts:  No  Homicidal Thoughts:  No  Memory:  Immediate;   Fair Recent;   Fair Remote;   Fair  Judgement:  Fair  Insight:  Fair  Psychomotor Activity:  Normal  Concentration:  Fair  Recall:  [employer] of Knowledge:Fair  Language: Fair  Akathisia:  No  Handed:  Right  AIMS (if indicated):     Assets:  Communication Skills Desire for Improvement  ADL's:  Intact  Cognition: WNL  Sleep:  Number of Hours: 6.25     Current Medications: Current Facility-Administered Medications  Medication Dose Route Frequency Provider Last Rate Last Dose  . acetaminophen (TYLENOL) tablet 650 mg  650 mg Oral Q6H PRN Ursula Alert, MD   650 mg at 05/07/15 1548  . ALPRAZolam (XANAX) tablet 0.25 mg  0.25 mg Oral QHS PRN Ursula Alert, MD      . alum & mag hydroxide-simeth (MAALOX/MYLANTA) 200-200-20 MG/5ML suspension 30 mL  30 mL Oral Q4H PRN Ursula Alert, MD      . hydrOXYzine (ATARAX/VISTARIL) tablet 25 mg  25 mg Oral Q6H PRN Ursula Alert, MD   25 mg at 05/10/15 2113  . magnesium hydroxide (MILK OF MAGNESIA) suspension 30 mL  30 mL Oral Daily PRN Saramma Eappen, MD      . montelukast (SINGULAIR) tablet 10 mg  10 mg Oral QHS Saramma Eappen, MD   10 mg at  05/10/15 2113  . nicotine (NICODERM CQ - dosed in mg/24 hours) 21 mg/24hr patch           . nicotine (NICODERM CQ - dosed in mg/24 hours) patch 21 mg  21 mg Transdermal Daily Ursula Alert, MD   21 mg at 05/11/15 4259  . sertraline (ZOLOFT) tablet 25 mg  25 mg Oral Q breakfast Ursula Alert, MD   25 mg at 05/11/15 0749  . traZODone (DESYREL) tablet 100 mg  100 mg Oral QHS Ursula Alert, MD   100 mg at 05/10/15 2113  . traZODone (DESYREL) tablet 50 mg  50 mg Oral QHS PRN Ursula Alert, MD   50 mg at 05/10/15 2336    Lab Results:  No results found for this or any previous visit (from the past 48 hour(s)).  Physical Findings: AIMS: Facial  and Oral Movements Muscles of Facial Expression: None, normal Lips and Perioral Area: None, normal Jaw: None, normal Tongue: None, normal,Extremity Movements Upper (arms, wrists, hands, fingers): None, normal Lower (legs, knees, ankles, toes): None, normal, Trunk Movements Neck, shoulders, hips: None, normal, Overall Severity Severity of abnormal movements (highest score from questions above): None, normal Incapacitation due to abnormal movements: None, normal Patient's awareness of abnormal movements (rate only patient's report): No Awareness, Dental Status Current problems with teeth and/or dentures?: No Does patient usually wear dentures?: No  CIWA:    COWS:     Assessment: Patient is a 73 y old CF who presented with worsening depression, SI with plan ( multiple) . Pt started on medications, sleep has improved with Trazodone. Will continue medications.  Treatment Plan Summary: Daily contact with patient to assess and evaluate symptoms and progress in treatment and Medication management Will continue Zoloft 25 mg po daily for affective sx. Pt provided with medication education. Will continue Trazodone to 100 mg po qhs , repeat 50 mg po qhs prn if she has sleep trouble . Will taper off Xanax po qhs, have reduced dose to 0.25 mg po prn. Pt has not  been using any xanax since admission. Will continue Vistaril 25 mg po q6 h po prn for anxiety sx. Will restart home medications where needed. Patient with recurrent SI , currently denies any plan - will observe on the unit. Will continue to monitor vitals ,medication compliance and treatment side effects while patient is here.  Will monitor for medical issues as well as call consult as needed.  Reviewed labs - TSH - wnl. CSW will start working on disposition.  Patient to participate in therapeutic milieu .   Medical Decision Making:  Review of Psycho-Social Stressors (1), Review or order clinical lab tests (1), Review of Last Therapy Session (1), Review of Medication Regimen & Side Effects (2) and Review of New Medication or Change in Dosage (2)  Freda Munro May Agustin, NP-C 05/11/2015, 4:59 PM I agree with assessment and plan Geralyn Flash A. Sabra Heck, M.D.

## 2015-05-11 NOTE — [facility] Group Notes (Signed)
[place] Group Notes:  (Nursing/MHT/Case Management/Adjunct)  Date:  05/11/2015  Time:  10:50 AM  Type of Therapy:  Nurse Education  Participation Level:  Active  Participation Quality:  Appropriate  Affect:  Appropriate  Cognitive:  Alert  Insight:  Appropriate  Engagement in Group:  Engaged  Modes of Intervention:  Education    Summary of Progress/Problems:  Lori Owens 05/11/2015, 10:50 AM

## 2015-05-11 NOTE — Progress Notes (Signed)
[place] Group Notes:  (Nursing/MHT/Case Management/Adjunct)  Date:  05/11/2015  Time:  9:08 PM  Type of Therapy:  Psychoeducational Skills  Participation Level:  Active  Participation Quality:  Attentive  Affect:  Depressed  Cognitive:  Appropriate  Insight:  Good  Engagement in Group:  Improving  Modes of Intervention:  Education  Summary of Progress/Problems: The patient shared with the group that she had a good day despite the fact that she didn't have any visitors. The patient verbalized that she might be discharged tomorrow. As a theme for the day, her support system will be comprised of her friends, mother, and children.   Chealsea Paske S 05/11/2015, 9:08 PM

## 2015-05-12 MED ORDER — SERTRALINE HCL 25 MG PO TABS: 25.0000 mg | ORAL_TABLET | Freq: Every day | ORAL | Status: DC

## 2015-05-12 MED ORDER — NICOTINE 21 MG/24HR TD PT24: 21.0000 mg | MEDICATED_PATCH | Freq: Every day | TRANSDERMAL | Status: DC

## 2015-05-12 MED ORDER — BUDESONIDE-FORMOTEROL FUMARATE 160-4.5 MCG/ACT IN AERO: INHALATION_SPRAY | RESPIRATORY_TRACT | Status: DC

## 2015-05-12 MED ORDER — TRAZODONE HCL 100 MG PO TABS: 100.0000 mg | ORAL_TABLET | Freq: Every day | ORAL | Status: DC

## 2015-05-12 MED ORDER — SINGULAIR 10 MG PO TABS: 10.0000 mg | ORAL_TABLET | Freq: Every day | ORAL | Status: DC

## 2015-05-12 NOTE — Discharge Summary (Signed)
Physician Discharge Summary Note  Patient:  Lori Owens is an 54 y.o., female MRN:  [id number] DOB:  Dec 04, 1960 Patient phone:  [phone] (home)  Patient address:   [address] [place] [address],  Total Time spent with patient: 30 minutes  Date of Admission:  05/07/2015 Date of Discharge: 05/12/15  Reason for Admission:  Depression with SI   Principal Problem: MDD (major depressive disorder), recurrent severe, without psychosis Discharge Diagnoses: Patient Active Problem List   Diagnosis Date Noted  . MDD (major depressive disorder), recurrent severe, without psychosis [F33.2] 05/07/2015  . Tobacco use disorder [Z72.0] 05/07/2015  . Obesity [E66.9] 05/03/2015  . Excessive daytime sleepiness [G47.19] 03/03/2015  . Heart palpitations [R00.2] 12/24/2014  . Chest pain [R07.9] 12/23/2014  . Snoring [R06.83] 12/23/2014  . Witnessed apneic spells [R06.81] 12/23/2014  . Cardiomyopathy [I42.9] 09/19/2014  . Mild persistent asthma [J45.30] 09/03/2014  . COPD exacerbation [J44.1] 09/02/2014  . Suspicious mole [D22.9] 04/19/2013  . SOB (shortness of breath) [R06.02] 04/19/2013  . Hypokalemia [E87.6] 01/18/2013  . Dehydration [E86.0] 01/18/2013  . Tachycardia [R00.0] 01/18/2013    Musculoskeletal: Strength & Muscle Tone: within normal limits Gait & Station: normal Patient leans: N/A  Psychiatric Specialty Exam: Physical Exam  Psychiatric: She has a normal mood and affect. Her speech is normal and behavior is normal. Judgment and thought content normal. Cognition and memory are normal.    Review of Systems  Constitutional: Negative.   HENT: Negative.   Eyes: Negative.   Respiratory: Negative.   Cardiovascular: Negative.   Gastrointestinal: Negative.   Genitourinary: Negative.   Musculoskeletal: Negative.   Skin: Negative.   Neurological: Negative.   Endo/Heme/Allergies: Negative.   Psychiatric/Behavioral: Positive for depression (Stabilized ). Negative for  suicidal ideas, hallucinations, memory loss and substance abuse. The patient is not nervous/anxious and does not have insomnia.     Blood pressure 118/86, pulse 115, temperature 97.9 F (36.6 C), temperature source Oral, resp. rate 18, height 5\' 6"  (1.676 m), weight 95.709 kg (211 lb), SpO2 97 %.Body mass index is 34.07 kg/(m^2).  See Physician SRA     Have you used any form of tobacco in the last 30 days? (Cigarettes, Smokeless Tobacco, Cigars, and/or Pipes): Yes  Has this patient used any form of tobacco in the last 30 days? (Cigarettes, Smokeless Tobacco, Cigars, and/or Pipes) Yes, Prescription not provided because: will provide Nicotine patches  Past Medical History:  Past Medical History  Diagnosis Date  . Depression 01/18/2013  . Tobacco abuse 01/18/2013  . Headache(784.0)   . COPD (chronic obstructive pulmonary disease)   . Heart murmur   . Melanoma   . Seasonal allergies   . Hypertension     Past Surgical History  Procedure Laterality Date  . Throat surgery    . Abdominal hysterectomy    . Nose surgery      polyp removal from nasal cavity   . Tonsillectomy     Family History:  Family History  Problem Relation Age of Onset  . Diabetes Maternal Grandfather   . Heart disease Maternal Grandfather   . Cancer Maternal Grandfather   . Hypertension Maternal Grandfather    Social History:  History  Alcohol Use No     History  Drug Use No    History   Social History  . Marital Status: Single    Spouse Name: N/A  . Number of Children: N/A  . Years of Education: N/A   Social History Main Topics  .  Smoking status: Current Every Day Smoker -- 0.50 packs/day  . Smokeless tobacco: Never Used  . Alcohol Use: No  . Drug Use: No  . Sexual Activity: No   Other Topics Concern  . None   Social History Narrative   ** Merged History Encounter **        Risk to Self: Is patient at risk for suicide?: No What has been your use of drugs/alcohol within the last 12  months?: None  Risk to Others:   Prior Inpatient Therapy:   Prior Outpatient Therapy:    Level of Care:  OP  Hospital Course:   Lori Owens is a 54 y.o. female who voluntarily presents to [facility] for psych eval at the request of her physician--DR. Dewey for SI/HI thoughts. Pt states that she has recently changed her medication from paxil to lexapro, 2 wks ago and she has increased anger towards her family for months and the anger has worsened x1week. Pt says she is SI x77mos and says--"I don't care if I'm here or not", stating that she wouldn't care if another car hit her or if she ran off the road. Pt says she care if she dies. Pt says she checked on purchase of a gun. Pt says her emotional state was triggered by worsening health problems(says she was dx with heart and breathing problems and told by her drs. That she didn't have any health problems. 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 She also says she lives with her mother and boyfriend and says she has to take care of them and her self and she is stressed out. Pt says she has been hearing voices x71month without command, says that "they help me, they're not telling to something wrong. Pt denies SA. Pt reports HI and says she wanted to shoot her family because they don't listen to her and it upsets her. Pt hx SI attempts, no outpt/inpt mental health svcs.         Lori Owens was admitted to the adult 500 unit where she was evaluated and her symptoms were identified. Medication management was discussed and implemented. Pt was started on Zoloft 25 mg daily for depressive symptoms. She was encouraged to participate in unit programming. Medical problems were identified and treated appropriately. Home medication was restarted as needed.  She was evaluated each day by a clinical provider to ascertain the patient's response to treatment.  Improvement was noted by the patient's report of  decreasing symptoms, improved sleep and appetite, affect, medication tolerance, behavior, and participation in unit programming.  The pt was asked each day to complete a self inventory noting mood, mental status, pain, new symptoms, anxiety and concerns.         She responded well to medication and being in a therapeutic and supportive environment. The pt continued to report feeling as though her suicidal thoughts had only developed after being started on Lexapro and denied during her stay at [facility]. Positive and appropriate behavior was noted and the pt was motivated for recovery.  She worked closely with the treatment team and case manager to develop a discharge plan with appropriate goals. Coping skills, problem solving as well as relaxation therapies were also part of the unit programming. Pt talked about wanting to take care of herself instead of taking care of everyone else in her family. She was future oriented in talking about how she planned on asking for more support after  discharge. The pt felt that she was responding to Zoloft and denied any adverse reactions to the medication regimen. She was encouraged to stop smoking for numerous reasons including medical ones and was open to a Nicotine patch.          By the day of discharge she was in much improved condition than upon admission.  Symptoms were reported as significantly decreased or resolved completely. The pt denied SI/HI and voiced no AVH. She was motivated to continue taking medication with a goal of continued improvement in mental health.   Shristi Scheib was discharged home with a plan to follow up as noted below. The pt was provided with sample medications and prescriptions at time of discharge. She left [facility] in stable condition with all belongings returned to her.    Consults:  None  Significant Diagnostic Studies:  Chemistry panel, CBC, TSH, UDS,   Discharge Vitals:   Blood pressure 118/86, pulse 115, temperature 97.9 F (36.6 C),  temperature source Oral, resp. rate 18, height 5\' 6"  (1.676 m), weight 95.709 kg (211 lb), SpO2 97 %. Body mass index is 34.07 kg/(m^2). Lab Results:   No results found for this or any previous visit (from the past 72 hour(s)).  Physical Findings: AIMS: Facial and Oral Movements Muscles of Facial Expression: None, normal Lips and Perioral Area: None, normal Jaw: None, normal Tongue: None, normal,Extremity Movements Upper (arms, wrists, hands, fingers): None, normal Lower (legs, knees, ankles, toes): None, normal, Trunk Movements Neck, shoulders, hips: None, normal, Overall Severity Severity of abnormal movements (highest score from questions above): None, normal Incapacitation due to abnormal movements: None, normal Patient's awareness of abnormal movements (rate only patient's report): No Awareness, Dental Status Current problems with teeth and/or dentures?: No Does patient usually wear dentures?: No  CIWA:    COWS:      See Psychiatric Specialty Exam and Suicide Risk Assessment completed by Attending Physician prior to discharge.  Discharge destination:  Home  Is patient on multiple antipsychotic therapies at discharge:  No   Has Patient had three or more failed trials of antipsychotic monotherapy by history:  No    Recommended Plan for Multiple Antipsychotic Therapies: NA      Discharge Instructions    Discharge instructions    Complete by:  As directed   Please see your Primary Care Provider as scheduled  for further management of medical problems and for refills of those medications.            Medication List    STOP taking these medications        ALPRAZolam 0.5 MG tablet  Commonly known as:  XANAX     escitalopram 20 MG tablet  Commonly known as:  LEXAPRO     metoprolol tartrate 25 MG tablet  Commonly known as:  LOPRESSOR     varenicline 0.5 MG X 11 & 1 MG X 42 tablet  Commonly known as:  CHANTIX PAK      TAKE these medications      Indication    albuterol 108 (90 BASE) MCG/ACT inhaler  Commonly known as:  PROVENTIL HFA;VENTOLIN HFA  Inhale 2 puffs into the lungs every 6 (six) hours as needed for wheezing or shortness of breath.      budesonide-formoterol 160-4.5 MCG/ACT inhaler  Commonly known as:  SYMBICORT  Take 2 puffs first thing in am and then another 2 puffs about 12 hours later.   Indication:  Chronic Obstructive Lung Disease  nicotine 21 mg/24hr patch  Commonly known as:  NICODERM CQ - dosed in mg/24 hours  Place 1 patch (21 mg total) onto the skin daily.   Indication:  Nicotine Addiction     sertraline 25 MG tablet  Commonly known as:  ZOLOFT  Take 1 tablet (25 mg total) by mouth daily with breakfast.   Indication:  Major Depressive Disorder     SINGULAIR 10 MG tablet  Generic drug:  montelukast  Take 1 tablet (10 mg total) by mouth at bedtime.   Indication:  Hayfever     traZODone 100 MG tablet  Commonly known as:  DESYREL  Take 1 tablet (100 mg total) by mouth at bedtime.   Indication:  Trouble Sleeping       Follow-up Information    Follow up with [facility].   Why:  Call Arbie Cookey on Tuesday to set up your initial appointment.  Other options include [facility] [place] , Dr Letta Moynahan at [phone] and [facility] [place] information:   [place] [phone]      Follow-up recommendations:    Activity: as tolerated Diet: heart heathy Follow up at [facility] as above  Comments:   Take all your medications as prescribed by your mental healthcare provider.  Report any adverse effects and or reactions from your medicines to your outpatient provider promptly.  Patient is instructed and cautioned to not engage in alcohol and or illegal drug use while on prescription medicines.  In the event of worsening symptoms, patient is instructed to call the crisis hotline, 911 and or go to the nearest ED for  appropriate evaluation and treatment of symptoms.  Follow-up with your primary care provider for your other medical issues, concerns and or health care needs.   Total Discharge Time: Greater than 30 minutes  Signed: Elmarie Shiley, NP-C 05/13/2015, 8:59 AM  I personally assessed the patient and formulated the plan Geralyn Flash A. Sabra Heck, M.D.

## 2015-05-12 NOTE — Progress Notes (Signed)
Patient ID: Lori Owens, female   DOB: Apr 02, 1961, 54 y.o.   MRN: [id number] Patient discharged per MD orders. Patient given education regarding follow-up appointments and medications. Patient denies any questions or concerns about these instructions. Patient was escorted to locker and given belongings before discharge to hospital lobby. Patient currently denies SI/HI and auditory and visual hallucinations on discharge.

## 2015-05-12 NOTE — Progress Notes (Signed)
  [facility] Adult Case Management Discharge Plan :  Will you be returning to the same living situation after discharge:  Yes,  home At discharge, do you have transportation home?: Yes,  family Do you have the ability to pay for your medications: Yes,  insurance  Release of information consent forms completed and in the chart;  Patient's signature needed at discharge.  Patient to Follow up at: Follow-up Information    Follow up with [facility].   Why:  Call Arbie Cookey on Tuesday to set up your initial appointment.  Other options include [facility] [place] , Dr Letta Moynahan at [place] [phone] and [facility] at [phone]   Contact information:   [place] [phone]      Patient denies SI/HI: Yes,  yes    Safety Planning and Suicide Prevention discussed: Yes,  yes  Have you used any form of tobacco in the last 30 days? (Cigarettes, Smokeless Tobacco, Cigars, and/or Pipes): Yes  Has patient been referred to the Quitline?: Yes, faxed on 05/12/15  Trish Mage 05/12/2015, 11:49 AM

## 2015-05-12 NOTE — [facility] Suicide Risk Assessment (Signed)
[facility] Discharge Suicide Risk Assessment   Demographic Factors:  Caucasian  Total Time spent with patient: 30 minutes  Musculoskeletal: Strength & Muscle Tone: within normal limits Gait & Station: normal Patient leans: normal  Psychiatric Specialty Exam: Physical Exam  Review of Systems  Constitutional: Negative.   HENT: Negative.   Eyes: Negative.   Respiratory: Negative.   Cardiovascular: Negative.   Gastrointestinal: Negative.   Genitourinary: Negative.   Musculoskeletal: Negative.   Skin: Negative.   Neurological: Negative.   Endo/Heme/Allergies: Negative.   Psychiatric/Behavioral: Positive for depression.    Blood pressure 118/86, pulse 115, temperature 97.9 F (36.6 C), temperature source Oral, resp. rate 18, height 5\' 6"  (1.676 m), weight 95.709 kg (211 lb), SpO2 97 %.Body mass index is 34.07 kg/(m^2).  General Appearance: Fairly Groomed  Engineer, water::  Fair  Speech:  Clear and [id number]  Volume:  Normal  Mood:  Euthymic  Affect:  Appropriate  Thought Process:  Coherent and Goal Directed  Orientation:  Full (Time, Place, and Person)  Thought Content:  plans as she moves on  Suicidal Thoughts:  No  Homicidal Thoughts:  No  Memory:  Immediate;   Fair Recent;   Fair Remote;   Fair  Judgement:  Fair  Insight:  Present  Psychomotor Activity:  Normal  Concentration:  Fair  Recall:  [employer] [place]  Language: Fair  Akathisia:  No  Handed:  Right  AIMS (if indicated):     Assets:  Desire for Improvement Housing Social Support  Sleep:  Number of Hours: 6.25  Cognition: WNL  ADL's:  Intact   Have you used any form of tobacco in the last 30 days? (Cigarettes, Smokeless Tobacco, Cigars, and/or Pipes): Yes  Has this patient used any form of tobacco in the last 30 days? (Cigarettes, Smokeless Tobacco, Cigars, and/or Pipes) Yes, Prescription not provided because: will provide Nicotine patches  Mental Status Per Nursing Assessment::   On Admission:   NA  Current Mental Status by Physician: in full contact with reality. there are no active SI plans or intent. he states the medications are working and reports no side effects  Loss Factors: Decline in physical health  Historical Factors: NA  Risk Reduction Factors:   Sense of responsibility to family and Living with another person, especially a relative  Continued Clinical Symptoms:  Depression:   Severe  Cognitive Features That Contribute To Risk:  Closed-mindedness, Polarized thinking and Thought constriction (tunnel vision)    Suicide Risk:  Minimal: No identifiable suicidal ideation.  Patients presenting with no risk factors but with morbid ruminations; may be classified as minimal risk based on the severity of the depressive symptoms  Principal Problem: MDD (major depressive disorder), recurrent severe, without psychosis Discharge Diagnoses:  Patient Active Problem List   Diagnosis Date Noted  . MDD (major depressive disorder), recurrent severe, without psychosis [F33.2] 05/07/2015  . Tobacco use disorder [Z72.0] 05/07/2015  . Obesity [E66.9] 05/03/2015  . Excessive daytime sleepiness [G47.19] 03/03/2015  . Heart palpitations [R00.2] 12/24/2014  . Chest pain [R07.9] 12/23/2014  . Snoring [R06.83] 12/23/2014  . Witnessed apneic spells [R06.81] 12/23/2014  . Cardiomyopathy [I42.9] 09/19/2014  . Mild persistent asthma [J45.30] 09/03/2014  . COPD exacerbation [J44.1] 09/02/2014  . Suspicious mole [D22.9] 04/19/2013  . SOB (shortness of breath) [R06.02] 04/19/2013  . Hypokalemia [E87.6] 01/18/2013  . Dehydration [E86.0] 01/18/2013  . Tachycardia [R00.0] 01/18/2013    Follow-up Information    Follow up with [facility].   Why:  Call Arbie Cookey on Tuesday to set up your initial appointment.  Other options include [facility] [place] , Dr Letta Moynahan at [place] [phone] and [facility] [place] information:   [place] [phone]      Plan Of Care/Follow-up recommendations:  Activity:  as tolerated Diet:  heart heathy Follow up at [facility] as above Is patient on multiple antipsychotic therapies at discharge:  No   Has Patient had three or more failed trials of antipsychotic monotherapy by history:  No  Recommended Plan for Multiple Antipsychotic Therapies: NA    Kelsee Preslar A 05/12/2015, 1:38 PM

## 2015-05-12 NOTE — Tx Team (Signed)
Interdisciplinary Treatment Plan Update (Adult)  Date:  05/12/2015   Time Reviewed:  11:38 AM   Progress in Treatment: Attending groups: Yes. Participating in groups:  Yes. Taking medication as prescribed:  Yes. Tolerating medication:  Yes. Family/Significant othe contact made:  Yes Patient understands diagnosis:  Yes  As evidenced by seeking help with depression Discussing patient identified problems/goals with staff:  Yes, see initial care plan. Medical problems stabilized or resolved:  Yes. Denies suicidal/homicidal ideation: Yes. Issues/concerns per patient self-inventory:  No. Other:  New problem(s) identified:  Discharge Plan or Barriers: return home, follow up outpt  Reason for Continuation of Hospitalization:   Comments:  Estimated length of stay:  D/C today  New goal(s):  Review of initial/current patient goals per problem list:     Attendees: Patient:  05/12/2015 11:38 AM   Family:   05/12/2015 11:38 AM   Physician:  Ursula Alert, MD 05/12/2015 11:38 AM   Nursing:   Marcella Dubs, RN 05/12/2015 11:38 AM   CSW:    Roque Lias, LCSW   05/12/2015 11:38 AM   Other:  05/12/2015 11:38 AM   Other:   05/12/2015 11:38 AM   Other:  Lars Pinks, Nurse CM 05/12/2015 11:38 AM   Other:  Lucinda Dell, [facility] 05/12/2015 11:38 AM   Other:  Norberto Sorenson, [place]  05/12/2015 11:38 AM   Other:  05/12/2015 11:38 AM   Other:  05/12/2015 11:38 AM   Other:  05/12/2015 11:38 AM   Other:  05/12/2015 11:38 AM   Other:  05/12/2015 11:38 AM   Other:   05/12/2015 11:38 AM    Scribe for Treatment Team:   Trish Mage, 05/12/2015 11:38 AM

## 2015-05-12 NOTE — Progress Notes (Signed)
D: Patient denies SI/HI and auditory and visual hallucinations.Patient rates helpessness, anxitey and depression as 0/0/0/ on her daily inventory. Goal today to go home.  Appropriate and cheerful on the unit, excited about going home.  A: Patient given emotional support from RN. Patient given medications per MD orders. Patient encouraged to attend groups and unit activities. Patient encouraged to come to staff with any questions or concerns.  R: Patient remains cooperative and appropriate. Will continue to monitor patient for safety.

## 2015-05-12 NOTE — Progress Notes (Signed)
Writer spoke with patient 1:1 and she is looking forward to discharge on tomorrow. She has no complaints and is compliant with her medications, attended group this evening. She denies si/hi/a/v hallucinations. Support and encouragement given, safety maintained on unit with 15 min checks.

## 2015-05-26 ENCOUNTER — Other Ambulatory Visit: Payer: Self-pay | Admitting: Family Medicine

## 2015-05-26 DIAGNOSIS — R52 Pain, unspecified: Secondary | ICD-10-CM

## 2015-05-26 DIAGNOSIS — R609 Edema, unspecified: Secondary | ICD-10-CM

## 2015-05-27 ENCOUNTER — Ambulatory Visit
Admission: RE | Admit: 2015-05-27 | Discharge: 2015-05-27 | Disposition: A | Discharge status: Home / Self Care | Payer: 59 | Source: Ambulatory Visit | Attending: Family Medicine | Admitting: Family Medicine

## 2015-05-27 DIAGNOSIS — R52 Pain, unspecified: Secondary | ICD-10-CM

## 2015-05-27 DIAGNOSIS — R609 Edema, unspecified: Secondary | ICD-10-CM

## 2015-05-30 ENCOUNTER — Other Ambulatory Visit: Payer: Self-pay | Admitting: Family Medicine

## 2015-05-30 DIAGNOSIS — Z1231 Encounter for screening mammogram for malignant neoplasm of breast: Secondary | ICD-10-CM

## 2015-06-05 ENCOUNTER — Ambulatory Visit
Admission: RE | Admit: 2015-06-05 | Discharge: 2015-06-05 | Disposition: A | Discharge status: Home / Self Care | Payer: 59 | Source: Ambulatory Visit | Attending: Family Medicine | Admitting: Family Medicine

## 2015-06-05 DIAGNOSIS — Z1231 Encounter for screening mammogram for malignant neoplasm of breast: Secondary | ICD-10-CM

## 2015-06-06 ENCOUNTER — Other Ambulatory Visit: Payer: Self-pay | Admitting: Family Medicine

## 2015-06-06 DIAGNOSIS — R928 Other abnormal and inconclusive findings on diagnostic imaging of breast: Secondary | ICD-10-CM

## 2015-06-12 ENCOUNTER — Ambulatory Visit
Admission: RE | Admit: 2015-06-12 | Discharge: 2015-06-12 | Disposition: A | Discharge status: Home / Self Care | Payer: 59 | Source: Ambulatory Visit | Attending: Family Medicine | Admitting: Family Medicine

## 2015-06-12 DIAGNOSIS — R928 Other abnormal and inconclusive findings on diagnostic imaging of breast: Secondary | ICD-10-CM

## 2015-08-12 ENCOUNTER — Emergency Department (HOSPITAL_COMMUNITY)
Admission: EM | Admit: 2015-08-12 | Discharge: 2015-08-13 | Disposition: A | Discharge status: Home / Self Care | Payer: 59 | Attending: Emergency Medicine | Admitting: Emergency Medicine

## 2015-08-12 ENCOUNTER — Encounter (HOSPITAL_COMMUNITY): Payer: Self-pay | Admitting: Emergency Medicine

## 2015-08-12 ENCOUNTER — Emergency Department (HOSPITAL_COMMUNITY): Payer: 59

## 2015-08-12 DIAGNOSIS — W1839XA Other fall on same level, initial encounter: Secondary | ICD-10-CM | POA: Diagnosis not present

## 2015-08-12 DIAGNOSIS — S299XXA Unspecified injury of thorax, initial encounter: Secondary | ICD-10-CM | POA: Insufficient documentation

## 2015-08-12 DIAGNOSIS — J449 Chronic obstructive pulmonary disease, unspecified: Secondary | ICD-10-CM | POA: Diagnosis not present

## 2015-08-12 DIAGNOSIS — R1110 Vomiting, unspecified: Secondary | ICD-10-CM | POA: Insufficient documentation

## 2015-08-12 DIAGNOSIS — R011 Cardiac murmur, unspecified: Secondary | ICD-10-CM | POA: Insufficient documentation

## 2015-08-12 DIAGNOSIS — I10 Essential (primary) hypertension: Secondary | ICD-10-CM | POA: Diagnosis not present

## 2015-08-12 DIAGNOSIS — Z79899 Other long term (current) drug therapy: Secondary | ICD-10-CM | POA: Diagnosis not present

## 2015-08-12 DIAGNOSIS — Z85820 Personal history of malignant melanoma of skin: Secondary | ICD-10-CM | POA: Insufficient documentation

## 2015-08-12 DIAGNOSIS — W19XXXA Unspecified fall, initial encounter: Secondary | ICD-10-CM

## 2015-08-12 DIAGNOSIS — Y9389 Activity, other specified: Secondary | ICD-10-CM | POA: Insufficient documentation

## 2015-08-12 DIAGNOSIS — Y9289 Other specified places as the place of occurrence of the external cause: Secondary | ICD-10-CM | POA: Diagnosis not present

## 2015-08-12 DIAGNOSIS — F329 Major depressive disorder, single episode, unspecified: Secondary | ICD-10-CM | POA: Diagnosis not present

## 2015-08-12 DIAGNOSIS — Z720 Tobacco use: Secondary | ICD-10-CM | POA: Insufficient documentation

## 2015-08-12 DIAGNOSIS — R0789 Other chest pain: Secondary | ICD-10-CM

## 2015-08-12 DIAGNOSIS — Y998 Other external cause status: Secondary | ICD-10-CM | POA: Insufficient documentation

## 2015-08-12 DIAGNOSIS — Z7951 Long term (current) use of inhaled steroids: Secondary | ICD-10-CM | POA: Insufficient documentation

## 2015-08-12 MED ORDER — MELOXICAM 7.5 MG PO TABS: 15.0000 mg | ORAL_TABLET | Freq: Every day | ORAL | Status: DC

## 2015-08-12 MED ORDER — OXYCODONE-ACETAMINOPHEN 5-325 MG PO TABS: 1.0000 | ORAL_TABLET | Freq: Four times a day (QID) | ORAL | Status: DC | PRN

## 2015-08-12 NOTE — Discharge Instructions (Signed)
Take Mobic as prescribed daily and Percocet as needed for sever pain. Use an incentive spirometer once per hour while awake to ensure deep breathing. Follow up with your primary care doctor as needed for persistent symptoms.  Chest Wall Pain Chest wall pain is pain in or around the bones and muscles of your chest. Sometimes, an injury causes this pain. Sometimes, the cause may not be known. This pain may take several weeks or longer to get better. HOME CARE INSTRUCTIONS  Pay attention to any changes in your symptoms. Take these actions to help with your pain:   Rest as told by your health care provider.   Avoid activities that cause pain. These include any activities that use your chest muscles or your abdominal and side muscles to lift heavy items.   If directed, apply ice to the painful area:  Put ice in a plastic bag.  Place a towel between your skin and the bag.  Leave the ice on for 20 minutes, 2-3 times per day.  Take over-the-counter and prescription medicines only as told by your health care provider.  Do not use tobacco products, including cigarettes, chewing tobacco, and e-cigarettes. If you need help quitting, ask your health care provider.  Keep all follow-up visits as told by your health care provider. This is important. SEEK MEDICAL CARE IF:  You have a fever.  Your chest pain becomes worse.  You have new symptoms. SEEK IMMEDIATE MEDICAL CARE IF:  You have nausea or vomiting.  You feel sweaty or light-headed.  You have a cough with phlegm (sputum) or you cough up blood.  You develop shortness of breath.   This information is not intended to replace advice given to you by your health care provider. Make sure you discuss any questions you have with your health care provider.   Document Released: 10/25/2005 Document Revised: 07/16/2015 Document Reviewed: 01/20/2015 Elsevier Interactive Patient Education [employer].

## 2015-08-12 NOTE — ED Notes (Signed)
Patient states she fell about 1 week ago onto concrete onto her right side. Patient states she was seen by PCP and was told there were no broken ribs. Patient c/o right sided pain, sternum to back. Patient states the pain is worse when she coughs. Patient states the pain has continued to progress. Patient states she was taking Hydrocodone (from her mother) for pain, last dose at 38.

## 2015-08-12 NOTE — ED Provider Notes (Signed)
CSN: [id number]     Arrival date & time 08/12/15  2110 History  By signing my name below, I, Tula Nakayama, attest that this documentation has been prepared under the direction and in the presence of Antonietta Breach, PA-C  Electronically Signed: Tula Nakayama, ED Scribe. 08/12/2015. 12:20 AM.   Chief Complaint  Patient presents with  . Fall    1 week ago, Right sided rib pain   The history is provided by the patient. No language interpreter was used.    HPI Comments: Lori Owens is a 54 y.o. female who presents to the Emergency Department complaining of constant, gradually worsening, moderate right anterior rib pain that radiates around to her right back and started after she fell 1 week ago. She states nausea as an associated symptom. Her pain becomes worse with movement, cough and deep breaths. Pt tried 1 Vicodin with no relief. Pt reports that she fell onto concrete 1 week ago and landed on her right side. She was seen by her PCP after the fall who did not suspect broken bones and did not do any imaging. Pt denies hemoptysis, hematuria, abdominal pain, vomiting  Past Medical History  Diagnosis Date  . Depression 01/18/2013  . Tobacco abuse 01/18/2013  . Headache(784.0)   . COPD (chronic obstructive pulmonary disease) ([place])   . Heart murmur   . Melanoma ([place])   . Seasonal allergies   . Hypertension    Past Surgical History  Procedure Laterality Date  . Throat surgery    . Abdominal hysterectomy    . Nose surgery      polyp removal from nasal cavity   . Tonsillectomy     Family History  Problem Relation Age of Onset  . Diabetes Maternal Grandfather   . Heart disease Maternal Grandfather   . Cancer Maternal Grandfather   . Hypertension Maternal Grandfather    Social History  Substance Use Topics  . Smoking status: Current Every Day Smoker -- 0.75 packs/day    Types: Cigarettes  . Smokeless tobacco: Never Used  . Alcohol Use: No   OB History    No data available       Review of Systems  Cardiovascular: Positive for chest pain (R chest wall).  All other systems reviewed and are negative.   Allergies  Review of patient's allergies indicates no known allergies.  Home Medications   Prior to Admission medications   Medication Sig Start Date End Date Taking? Authorizing Provider  albuterol (PROVENTIL HFA;VENTOLIN HFA) 108 (90 BASE) MCG/ACT inhaler Inhale 2 puffs into the lungs every 6 (six) hours as needed for wheezing or shortness of breath. 09/05/14   Orson Eva, MD  budesonide-formoterol [facility]) 160-4.5 MCG/ACT inhaler Take 2 puffs first thing in am and then another 2 puffs about 12 hours later. 05/12/15   Niel Hummer, NP  meloxicam (MOBIC) 7.5 MG tablet Take 2 tablets (15 mg total) by mouth daily. 08/12/15   Antonietta Breach, PA-C  nicotine (NICODERM CQ - DOSED IN MG/24 HOURS) 21 mg/24hr patch Place 1 patch (21 mg total) onto the skin daily. 05/12/15   Niel Hummer, NP  oxyCODONE-acetaminophen (PERCOCET/ROXICET) 5-325 MG tablet Take 1-2 tablets by mouth every 6 (six) hours as needed for severe pain. 08/12/15   Antonietta Breach, PA-C  sertraline (ZOLOFT) 25 MG tablet Take 1 tablet (25 mg total) by mouth daily with breakfast. 05/12/15   Niel Hummer, NP  SINGULAIR 10 MG tablet Take 1 tablet (10 mg total) by mouth  at bedtime. 05/12/15   Niel Hummer, NP  traZODone (DESYREL) 100 MG tablet Take 1 tablet (100 mg total) by mouth at bedtime. 05/12/15   Niel Hummer, NP   BP 121/77 mmHg  Pulse 77  Temp(Src) 98.1 F (36.7 C) (Oral)  Resp 20  Ht 5\' 6"  (1.676 m)  Wt 190 lb (86.183 kg)  BMI 30.68 kg/m2  SpO2 97% Physical Exam  Constitutional: She is oriented to person, place, and time. She appears well-developed and well-nourished. No distress.  Nontoxic/nonseptic appearing  HENT:  Head: Normocephalic and atraumatic.  Eyes: Conjunctivae and EOM are normal. No scleral icterus.  Neck: Normal range of motion.  Cardiovascular: Normal rate, regular rhythm and intact  distal pulses.   Pulmonary/Chest: Effort normal and breath sounds normal. No respiratory distress. She has no wheezes. She has no rales.  Clear breath sounds in all quadrants. Chest expansion symmetric. Tender to palpation to the right lateral chest wall. No crepitus or deformity.  Musculoskeletal: Normal range of motion.  Neurological: She is alert and oriented to person, place, and time. She exhibits normal muscle tone. Coordination normal.  Skin: Skin is warm and dry. No rash noted. She is not diaphoretic. No erythema. No pallor.  Psychiatric: She has a normal mood and affect. Her behavior is normal.  Nursing note and vitals reviewed.   ED Course  Procedures    Labs Review Labs Reviewed - No data to display  Imaging Review Dg Ribs Unilateral W/chest Right  08/12/2015   CLINICAL DATA:  Fall 1 week ago.  Right rib pain.  EXAM: RIGHT RIBS AND CHEST - 3+ VIEW  COMPARISON:  11/06/2014  FINDINGS: Normal heart size. Clear lungs. No pneumothorax. No evidence of rib fracture.  IMPRESSION: No active cardiopulmonary disease.   Electronically Signed   By: Marybelle Killings M.D.   On: 08/12/2015 23:24   I have personally reviewed and evaluated these images and lab results as part of my medical decision-making.   EKG Interpretation None      MDM   Final diagnoses:  Right-sided chest wall pain  Fall, initial encounter    54 year old female presents to the emergency department for evaluation of persistent right-sided chest wall pain after a fall which occurred one week ago. Patient has no hypoxia. Lungs clear in all quadrants. Chest expansion symmetric. X-ray negative for rib fracture or pneumothorax. Will discharge with incentive spirometer and Percocet for pain control. Primary care follow-up advised in 1 week. Return precautions discussed and provided. Patient agreeable to plan with no unaddressed concerns. Patient discharged in good condition.  I, My [place], personally performed the  services described in this documentation. All medical record entries made by the scribe were at my direction and in my presence.  I have reviewed the chart and discharge instructions and agree that the record reflects my personal performance and is accurate and complete. Aprille Sawhney.  08/13/2015. 12:21 AM.      Antonietta Breach, PA-C 08/13/15 0022  Lacretia Leigh, MD 08/14/15 [phone]

## 2015-08-17 ENCOUNTER — Emergency Department (HOSPITAL_COMMUNITY): Payer: 59

## 2015-08-17 ENCOUNTER — Emergency Department (HOSPITAL_COMMUNITY)
Admission: EM | Admit: 2015-08-17 | Discharge: 2015-08-17 | Disposition: A | Discharge status: Home / Self Care | Payer: 59 | Attending: Emergency Medicine | Admitting: Emergency Medicine

## 2015-08-17 ENCOUNTER — Encounter (HOSPITAL_COMMUNITY): Payer: Self-pay | Admitting: Emergency Medicine

## 2015-08-17 DIAGNOSIS — Z7951 Long term (current) use of inhaled steroids: Secondary | ICD-10-CM | POA: Diagnosis not present

## 2015-08-17 DIAGNOSIS — Z720 Tobacco use: Secondary | ICD-10-CM | POA: Insufficient documentation

## 2015-08-17 DIAGNOSIS — I10 Essential (primary) hypertension: Secondary | ICD-10-CM | POA: Diagnosis not present

## 2015-08-17 DIAGNOSIS — H61899 Other specified disorders of external ear, unspecified ear: Secondary | ICD-10-CM | POA: Insufficient documentation

## 2015-08-17 DIAGNOSIS — R011 Cardiac murmur, unspecified: Secondary | ICD-10-CM | POA: Insufficient documentation

## 2015-08-17 DIAGNOSIS — R634 Abnormal weight loss: Secondary | ICD-10-CM | POA: Insufficient documentation

## 2015-08-17 DIAGNOSIS — Z85820 Personal history of malignant melanoma of skin: Secondary | ICD-10-CM | POA: Diagnosis not present

## 2015-08-17 DIAGNOSIS — S2231XD Fracture of one rib, right side, subsequent encounter for fracture with routine healing: Secondary | ICD-10-CM | POA: Insufficient documentation

## 2015-08-17 DIAGNOSIS — F329 Major depressive disorder, single episode, unspecified: Secondary | ICD-10-CM | POA: Insufficient documentation

## 2015-08-17 DIAGNOSIS — Z79899 Other long term (current) drug therapy: Secondary | ICD-10-CM | POA: Diagnosis not present

## 2015-08-17 DIAGNOSIS — J449 Chronic obstructive pulmonary disease, unspecified: Secondary | ICD-10-CM | POA: Diagnosis not present

## 2015-08-17 DIAGNOSIS — Z791 Long term (current) use of non-steroidal anti-inflammatories (NSAID): Secondary | ICD-10-CM | POA: Diagnosis not present

## 2015-08-17 DIAGNOSIS — W1839XD Other fall on same level, subsequent encounter: Secondary | ICD-10-CM | POA: Insufficient documentation

## 2015-08-17 DIAGNOSIS — R0781 Pleurodynia: Secondary | ICD-10-CM | POA: Diagnosis present

## 2015-08-17 LAB — I-STAT CHEM 8, ED
BUN: 18 mg/dL (ref 6–20)
CHLORIDE: 101 mmol/L (ref 101–111)
CREATININE: 0.8 mg/dL (ref 0.44–1.00)
Calcium, Ion: 1.19 mmol/L (ref 1.12–1.23)
Glucose, Bld: 95 mg/dL (ref 65–99)
HCT: 41 % (ref 36.0–46.0)
Hemoglobin: 13.9 g/dL (ref 12.0–15.0)
Potassium: 4.3 mmol/L (ref 3.5–5.1)
Sodium: 138 mmol/L (ref 135–145)
TCO2: 25 mmol/L (ref 0–100)

## 2015-08-17 MED ORDER — IOHEXOL 300 MG/ML  SOLN
75.0000 mL | Freq: Once | INTRAMUSCULAR | Status: AC | PRN
  Administered 2015-08-17: 75 mL via INTRAVENOUS

## 2015-08-17 MED ORDER — SODIUM CHLORIDE 0.9 % IV SOLN
INTRAVENOUS | Status: DC
  Administered 2015-08-17: 09:00:00 via INTRAVENOUS

## 2015-08-17 MED ORDER — HYDROCODONE-ACETAMINOPHEN 5-325 MG PO TABS: 1.0000 | ORAL_TABLET | ORAL | Status: DC | PRN

## 2015-08-17 MED ORDER — KETOROLAC TROMETHAMINE 30 MG/ML IJ SOLN
30.0000 mg | Freq: Once | INTRAMUSCULAR | Status: AC
  Administered 2015-08-17: 30 mg via INTRAVENOUS
  Filled 2015-08-17: qty 1

## 2015-08-17 NOTE — ED Notes (Signed)
Pt to CT scan and returned to room without distress note.

## 2015-08-17 NOTE — Discharge Instructions (Signed)

## 2015-08-17 NOTE — ED Notes (Signed)
Pt states that she was seen here for a fall and was given pain meds and muscle relaxer.  Pt states that she is still having pain on rib cage on right side on anterior and posterior.  Pt also states that she has lost about 10 pounds in about 2 weeks.  Pt states that her ear sound "echoing".  Pt states that her cough is productive with brown phlegm.

## 2015-08-17 NOTE — ED Notes (Addendum)
Occ productive cough of brownish sputum with auscultated exp wheezing to bil lower lobes, rt ribcage pain s/p to falling approx 10 days ago with recent x-ray which was negative at that time. No bruising noted but tender to touch.

## 2015-08-17 NOTE — ED Notes (Signed)
Awake. Verbally responsive. A/O x4. Resp even and unlabored. No audible adventitious breath sounds noted. ABC's intact.  

## 2015-08-17 NOTE — ED Provider Notes (Signed)
CSN: [id number]     Arrival date & time 08/17/15  2409 History   First MD Initiated Contact with Patient 08/17/15 [phone]     Chief Complaint  Patient presents with  . rib cage pain   . Weight Loss  . Ear Fullness     (Consider location/radiation/quality/duration/timing/severity/associated sxs/prior Treatment) HPI   Lori Owens is a 54 y.o. female who presents for evaluation of right-sided pain, which is persistent since a fall 2 weeks ago. She was evaluated by her PCP, then here, and still has no relief of her discomfort. He is eating more than usual, but states that she is losing weight. She denies nausea or vomiting. She has a cough productive of "brown sputum". No persistent shortness of breath. No weakness, dizziness or problems walking. There are no other known modifying factors.   Past Medical History  Diagnosis Date  . Depression 01/18/2013  . Tobacco abuse 01/18/2013  . Headache(784.0)   . COPD (chronic obstructive pulmonary disease) ([place])   . Heart murmur   . Melanoma ([place])   . Seasonal allergies   . Hypertension    Past Surgical History  Procedure Laterality Date  . Throat surgery    . Abdominal hysterectomy    . Nose surgery      polyp removal from nasal cavity   . Tonsillectomy     Family History  Problem Relation Age of Onset  . Diabetes Maternal Grandfather   . Heart disease Maternal Grandfather   . Cancer Maternal Grandfather   . Hypertension Maternal Grandfather    Social History  Substance Use Topics  . Smoking status: Current Every Day Smoker -- 0.75 packs/day    Types: Cigarettes  . Smokeless tobacco: Never Used  . Alcohol Use: No   OB History    No data available     Review of Systems  All other systems reviewed and are negative.     Allergies  Review of patient's allergies indicates no known allergies.  Home Medications   Prior to Admission medications   Medication Sig Start Date End Date Taking? Authorizing Provider  albuterol  (PROVENTIL HFA;VENTOLIN HFA) 108 (90 BASE) MCG/ACT inhaler Inhale 2 puffs into the lungs every 6 (six) hours as needed for wheezing or shortness of breath. 09/05/14  Yes Orson Eva, MD  Fluticasone-Salmeterol (ADVAIR) 250-50 MCG/DOSE AEPB Inhale 1 puff into the lungs 2 (two) times daily.   Yes Historical Provider, MD  meloxicam (MOBIC) 7.5 MG tablet Take 2 tablets (15 mg total) by mouth daily. 08/12/15  Yes Antonietta Breach, PA-C  oxyCODONE-acetaminophen (PERCOCET/ROXICET) 5-325 MG tablet Take 1-2 tablets by mouth every 6 (six) hours as needed for severe pain. 08/12/15  Yes Kelly Humes, PA-C  SINGULAIR 10 MG tablet Take 1 tablet (10 mg total) by mouth at bedtime. 05/12/15  Yes Niel Hummer, NP  traZODone (DESYREL) 150 MG tablet Take 150 mg by mouth at bedtime.   Yes Historical Provider, MD  budesonide-formoterol (SYMBICORT) 160-4.5 MCG/ACT inhaler Take 2 puffs first thing in am and then another 2 puffs about 12 hours later. Patient not taking: Reported on 08/17/2015 05/12/15   Niel Hummer, NP  HYDROcodone-acetaminophen (NORCO) 5-325 MG tablet Take 1 tablet by mouth every 4 (four) hours as needed. 08/17/15   Daleen Bo, MD  nicotine (NICODERM CQ - DOSED IN MG/24 HOURS) 21 mg/24hr patch Place 1 patch (21 mg total) onto the skin daily. Patient not taking: Reported on 08/17/2015 05/12/15   Niel Hummer, NP  sertraline (ZOLOFT) 25 MG tablet Take 1 tablet (25 mg total) by mouth daily with breakfast. Patient not taking: Reported on 08/17/2015 05/12/15   Niel Hummer, NP  traZODone (DESYREL) 100 MG tablet Take 1 tablet (100 mg total) by mouth at bedtime. Patient not taking: Reported on 08/17/2015 05/12/15   Niel Hummer, NP   BP 158/96 mmHg  Pulse 63  Resp 18  SpO2 98% Physical Exam  Constitutional: She is oriented to person, place, and time. She appears well-developed and well-nourished. No distress.  HENT:  Head: Normocephalic and atraumatic.  Right Ear: External ear normal.  Left Ear: External ear normal.   Eyes: Conjunctivae and EOM are normal. Pupils are equal, round, and reactive to light.  Neck: Normal range of motion and phonation normal. Neck supple.  Cardiovascular: Normal rate, regular rhythm and normal heart sounds.   Pulmonary/Chest: Effort normal and breath sounds normal. No respiratory distress. She exhibits tenderness (right lower posterior chest wall, without crepitation or deformity, moderate). She exhibits no bony tenderness.  Abdominal: Soft. There is no tenderness.  Musculoskeletal: Normal range of motion.  Neurological: She is alert and oriented to person, place, and time. No cranial nerve deficit or sensory deficit. She exhibits normal muscle tone. Coordination normal.  Skin: Skin is warm, dry and intact.  Psychiatric: She has a normal mood and affect. Her behavior is normal. Judgment and thought content normal.  Nursing note and vitals reviewed.   ED Course  Procedures (including critical care time)  Medications  0.9 %  sodium chloride infusion ( Intravenous New Bag/Given 08/17/15 0926)  ketorolac (TORADOL) 30 MG/ML injection 30 mg (30 mg Intravenous Given 08/17/15 0929)  iohexol (OMNIPAQUE) 300 MG/ML solution 75 mL (75 mLs Intravenous Contrast Given 08/17/15 0956)    Patient Vitals for the past 24 hrs:  BP Pulse Resp SpO2  08/17/15 0840 158/96 mmHg 63 18 98 %    10:43 AM Reevaluation with update and discussion. After initial assessment and treatment, an updated evaluation reveals she is more comfortable now. Findings discussed with the patient, all questions were answered. [place] Review Labs Reviewed  I-STAT CHEM 8, ED    Imaging Review Ct Chest W Contrast  08/17/2015   CLINICAL DATA:  Right rib pain after fall 10 days ago. Occasional productive cough.  EXAM: CT CHEST WITH CONTRAST  TECHNIQUE: Multidetector CT imaging of the chest was performed during intravenous contrast administration.  CONTRAST:  28mL OMNIPAQUE IOHEXOL 300 MG/ML  SOLN   COMPARISON:  Radiographs of August 12, 2015. CT scan of chest of October 04, 2014.  FINDINGS: No pneumothorax is noted. Minimal right pleural effusion is noted with adjacent subsegmental atelectasis. Left lung is clear. There is no evidence of thoracic aortic dissection or aneurysm. Mildly enlarged mediastinal lymph nodes are noted, but none are greater than 1 cm in size, and therefore most likely reactive or inflammatory in etiology. Visualized portion of upper abdomen is unremarkable. Mildly displaced fracture seen involving the posterior portion of the right ninth rib.  IMPRESSION: Mildly displaced right ninth rib fracture. Minimal right pleural effusion is noted with adjacent subsegmental atelectasis. No other abnormality seen in the chest.   Electronically Signed   By: Marijo Conception, M.D.   On: 08/17/2015 10:23   I have personally reviewed and evaluated these images and lab results as part of my medical decision-making.   EKG Interpretation None      MDM   Final diagnoses:  Fracture of rib of right side, with routine healing, subsequent encounter    Refraction without associated complications. Patient stable for discharge to outpatient management for this problem. Doubt pneumonia, or metabolic instability.   Nursing Notes Reviewed/ Care Coordinated Applicable Imaging Reviewed Interpretation of Laboratory Data incorporated into ED treatment  The patient appears reasonably screened and/or stabilized for discharge and I doubt any other medical condition or other [facility] requiring further screening, evaluation, or treatment in the ED at this time prior to discharge.  Plan: Home Medications- Norco; Home Treatments- rest; return here if the recommended treatment, does not improve the symptoms; Recommended follow up- PCP prn     Daleen Bo, MD 08/17/15 1045

## 2015-10-10 ENCOUNTER — Telehealth: Payer: Self-pay | Admitting: Clinical

## 2015-10-10 NOTE — Telephone Encounter (Signed)
Follow-up with Lori Owens, says she is "doing all right, feeling better".

## 2016-01-23 ENCOUNTER — Encounter: Payer: Self-pay | Admitting: Neurology

## 2016-01-23 ENCOUNTER — Other Ambulatory Visit: Payer: Self-pay

## 2016-01-23 ENCOUNTER — Ambulatory Visit (INDEPENDENT_AMBULATORY_CARE_PROVIDER_SITE_OTHER): Payer: Self-pay | Admitting: Neurology

## 2016-01-23 VITALS — BP 120/86 | HR 77 | Ht 66.0 in | Wt 189.0 lb

## 2016-01-23 DIAGNOSIS — F17200 Nicotine dependence, unspecified, uncomplicated: Secondary | ICD-10-CM

## 2016-01-23 DIAGNOSIS — M797 Fibromyalgia: Secondary | ICD-10-CM

## 2016-01-23 DIAGNOSIS — R202 Paresthesia of skin: Secondary | ICD-10-CM

## 2016-01-23 NOTE — Progress Notes (Signed)
[facility] Clinic Note - Initial Visit   Date: 01/23/2016  Lori Owens MRN: [id number] DOB: 08-04-1961   Dear Dr. Ernie Hew:  Thank you for your kind referral of Lori Owens for consultation of generalized paresthesias. Although her history is well known to you, please allow [place] to reiterate it for the purpose of our medical record. The patient was accompanied to the clinic by self.   History of Present Illness: Lori Owens is a 55 y.o. right-handed Caucasian female with depression, tobacco abuse, COPD, and hypertension presenting for evaluation of generalized paresthesias.    Starting around November 2016, she began experiencing sensation tingling and generalized pain involving the whole body (face, arm, legs, back, chest, abdomen). If she rests and lay back on something, she develops whole body pain and cannot get comfortable.  She has relief with heat.  Nothing alleviates symptoms.  She tried Aleve which helps some.  She took Lyrica from her mother which helped a lot.  She endorses a lot of muscle tenderness and fatigue. Her pain involves her muscles, joints, and "everywhere".  She has a very low threshold for pain and is very uncomfortable.   She has chronic back and knee pain.  She fell once walking a dog 11-months ago.  No associated weakness.   Out-side paper records, electronic medical record, and images have been reviewed where available and summarized as:  Lab Results  Component Value Date   TSH 2.759 05/07/2015   Lab Results  Component Value Date   HGBA1C 5.6 06/06/2014   CT head 01/30/2015:  1. Negative noncontrast head CT.  2. Polypoid sinus disease involving the bilateral maxillary sinuses, right greater than left. No air-fluid levels.   Past Medical History  Diagnosis Date  . Depression 01/18/2013  . Tobacco abuse 01/18/2013  . Headache(784.0)   . COPD (chronic obstructive pulmonary disease) ([place])   . Heart murmur   . Melanoma ([place])   .  Seasonal allergies   . Hypertension     Past Surgical History  Procedure Laterality Date  . Throat surgery    . Abdominal hysterectomy    . Nose surgery      polyp removal from nasal cavity   . Tonsillectomy       Medications:  Outpatient Encounter Prescriptions as of 01/23/2016  Medication Sig Note  . albuterol (PROVENTIL HFA;VENTOLIN HFA) 108 (90 BASE) MCG/ACT inhaler Inhale 2 puffs into the lungs every 6 (six) hours as needed for wheezing or shortness of breath. 08/17/2015: -  . ALPRAZolam (XANAX) 0.5 MG tablet  01/23/2016: Received from: [facility]  . aspirin 81 MG tablet  01/23/2016: Received from: [facility]  . budesonide-formoterol (SYMBICORT) 160-4.5 MCG/ACT inhaler Take 2 puffs first thing in am and then another 2 puffs about 12 hours later.   . Cholecalciferol 5000 units capsule  01/23/2016: Received from: [facility]  . clobetasol cream (TEMOVATE) [id number] % Apply 1 application topically 2 (two) times daily.   Marland Kitchen docusate sodium (COLACE) 100 MG capsule  01/23/2016: Received from: [facility]  . Fluticasone-Salmeterol (ADVAIR) 250-50 MCG/DOSE AEPB Inhale 1 puff into the lungs 2 (two) times daily.   Marland Kitchen HYDROcodone-acetaminophen (NORCO) 5-325 MG tablet Take 1 tablet by mouth every 4 (four) hours as needed.   . loratadine (CLARITIN) 10 MG tablet  01/23/2016: Received from: [facility]  . nicotine (NICODERM CQ - DOSED IN MG/24 HOURS) 21 mg/24hr patch Place 1 patch (21 mg total) onto the skin daily.   Marland Kitchen  PARoxetine (PAXIL) 40 MG tablet  01/23/2016: Received from: [facility]  . sertraline (ZOLOFT) 25 MG tablet Take 1 tablet (25 mg total) by mouth daily with breakfast.   . SINGULAIR 10 MG tablet Take 1 tablet (10 mg total) by mouth at bedtime.   . traZODone (DESYREL) 150 MG tablet Take 150 mg by mouth at bedtime.   . triamcinolone cream (KENALOG) 0.1 %  01/23/2016: Received from:  [facility]  . [DISCONTINUED] meloxicam (MOBIC) 7.5 MG tablet Take 2 tablets (15 mg total) by mouth daily.   . [DISCONTINUED] metoprolol (LOPRESSOR) 50 MG tablet  01/23/2016: Received from: [facility]  . [DISCONTINUED] oxyCODONE-acetaminophen (PERCOCET/ROXICET) 5-325 MG tablet Take 1-2 tablets by mouth every 6 (six) hours as needed for severe pain.   . [DISCONTINUED] traZODone (DESYREL) 150 MG tablet  01/23/2016: Received from: [facility]  . [DISCONTINUED] traZODone (DESYREL) 100 MG tablet Take 1 tablet (100 mg total) by mouth at bedtime. (Patient not taking: Reported on 08/17/2015)    No facility-administered encounter medications on file as of 01/23/2016.     Allergies:  Allergies  Allergen Reactions  . Escitalopram Oxalate     Family History: Family History  Problem Relation Age of Onset  . Diabetes Maternal Grandfather   . Heart disease Maternal Grandfather   . Cancer Maternal Grandfather   . Hypertension Maternal Grandfather     Social History: Social History  Substance Use Topics  . Smoking status: Current Every Day Smoker -- 0.75 packs/day    Types: Cigarettes  . Smokeless tobacco: Never Used  . Alcohol Use: No   Social History   Social History Narrative   ** Merged History Encounter **   Lives with mother in a one story home.  Has 2 children.  Works as a Electrical engineer.  Education: some college.        Review of Systems:  CONSTITUTIONAL: No fevers, chills, night sweats, or weight loss.   EYES: No visual changes or eye pain ENT: No hearing changes.  No history of nose bleeds.   RESPIRATORY: No cough, wheezing and shortness of breath.   CARDIOVASCULAR: Negative for chest pain, and palpitations.   GI: Negative for abdominal discomfort, blood in stools or black stools.  No recent change in bowel habits.   GU:  No history of incontinence.    MUSCLOSKELETAL: No history of joint pain or swelling.  No myalgias.     SKIN: Negative for lesions, rash, and itching.   HEMATOLOGY/ONCOLOGY: Negative for prolonged bleeding, bruising easily, and swollen nodes.  No history of cancer.   ENDOCRINE: Negative for cold or heat intolerance, polydipsia or goiter.   PSYCH:  +depression or anxiety symptoms.   NEURO: As Above.   Vital Signs:  BP 120/86 mmHg  Pulse 77  Ht 5\' 6"  (1.676 m)  Wt 189 lb (85.73 kg)  BMI 30.52 kg/m2  SpO2 97%   General Medical Exam:   General:  Well appearing, comfortable.   Eyes/ENT: see cranial nerve examination.   Neck: No masses appreciated.  Full range of motion without tenderness.  No carotid bruits. Respiratory:  Clear to auscultation, good air entry bilaterally.   Cardiac:  Regular rate and rhythm, no murmur.   Extremities:  No deformities, edema, or skin discoloration.  Skin:  No rashes or lesions.  Neurological Exam: MENTAL STATUS including orientation to time, place, person, recent and remote memory, attention span and concentration, language, and fund of knowledge is normal.  Speech is  not dysarthric.  CRANIAL NERVES: II:  No visual field defects.  Unremarkable fundi.   III-IV-VI: Pupils equal round and reactive to light.  Normal conjugate, extra-ocular eye movements in all directions of gaze.  No nystagmus.  No ptosis.   V:  Normal facial sensation.     VII:  Normal facial symmetry and movements.  No pathologic facial reflexes.  VIII:  Normal hearing and vestibular function.   IX-X:  Normal palatal movement.   XI:  Normal shoulder shrug and head rotation.   XII:  Normal tongue strength and range of motion, no deviation or fasciculation.  MOTOR: Motor strength testing is somewhat hampered by patient's generalized pain and poor effort.  With repeated testing and encouragement, all motor groups are 5/5.   No atrophy, fasciculations or abnormal movements.  No pronator drift.  Tone is normal.  She had tenderpoints in 18/18 fibromyalgia tenderpoints bilaterally.     MSRs:     Right                                                                 Left brachioradialis 2+  brachioradialis 2+  biceps 2+  biceps 2+  triceps 2+  triceps 2+  patellar 2+  patellar 2+  ankle jerk 2+  ankle jerk 2+  Hoffman no  Hoffman no  plantar response down  plantar response down   SENSORY:  Normal and symmetric perception of light touch, pinprick, vibration, and proprioception.    COORDINATION/GAIT: Normal finger-to- nose-finger.  Inconsistent finger tapping bilaterally.  Estasia abasia gait pattern.  IMPRESSION: Ms. Vanallen is a 55 year-old female referred for paresthesias, which patient reports is characterized by not only tingling but moreso whole body pain.  Her exam is notable for nonphysiologic features and does not disclose any worrisome findings. There is no evidence of neuropathy.  Pretest probability of finding abnormalities to explain her whole body pain with NCS/EMG is very low, so it was decided not to move forward with this.  She certainly has significant point tenderness all over her body, raising the likelihood of fibromyalgia.  I informed that patient that our office does not manage fibromylagia and should discuss this diagnosis with her PCP.  Vitamin B12 level will be checked to be sure a treatable condition is not missed.  She was also advised to stop smoking.  The duration of this appointment visit was 45 minutes of face-to-face time with the patient.  Greater than 50% of this time was spent in counseling, explanation of diagnosis, planning of further management, and coordination of care.   Thank you for allowing me to participate in patient's care.  If I can answer any additional questions, I would be pleased to do so.    Sincerely,    Lori Karbowski K. Posey Pronto, DO

## 2016-01-23 NOTE — Patient Instructions (Signed)
1.  Check vitamin B12 2.  Follow-up with your primary care provider for possible diagnosis of fibromylagia

## 2016-01-23 NOTE — Progress Notes (Signed)
Note routed

## 2016-02-02 ENCOUNTER — Other Ambulatory Visit: Payer: Self-pay | Admitting: Family Medicine

## 2016-02-02 ENCOUNTER — Ambulatory Visit
Admission: RE | Admit: 2016-02-02 | Discharge: 2016-02-02 | Disposition: A | Discharge status: Home / Self Care | Payer: BLUE CROSS/BLUE SHIELD | Source: Ambulatory Visit | Attending: Family Medicine | Admitting: Family Medicine

## 2016-02-02 DIAGNOSIS — M25561 Pain in right knee: Secondary | ICD-10-CM

## 2016-02-02 DIAGNOSIS — M25562 Pain in left knee: Secondary | ICD-10-CM

## 2016-03-30 IMAGING — CR DG CHEST 2V
2 series · 2 of 2 positions shown · non-contrast
Comparison: 11/05/2013

CLINICAL DATA: Cough, shortness of breath, COPD.

EXAM:
CHEST  2 VIEW

[w chest pa]
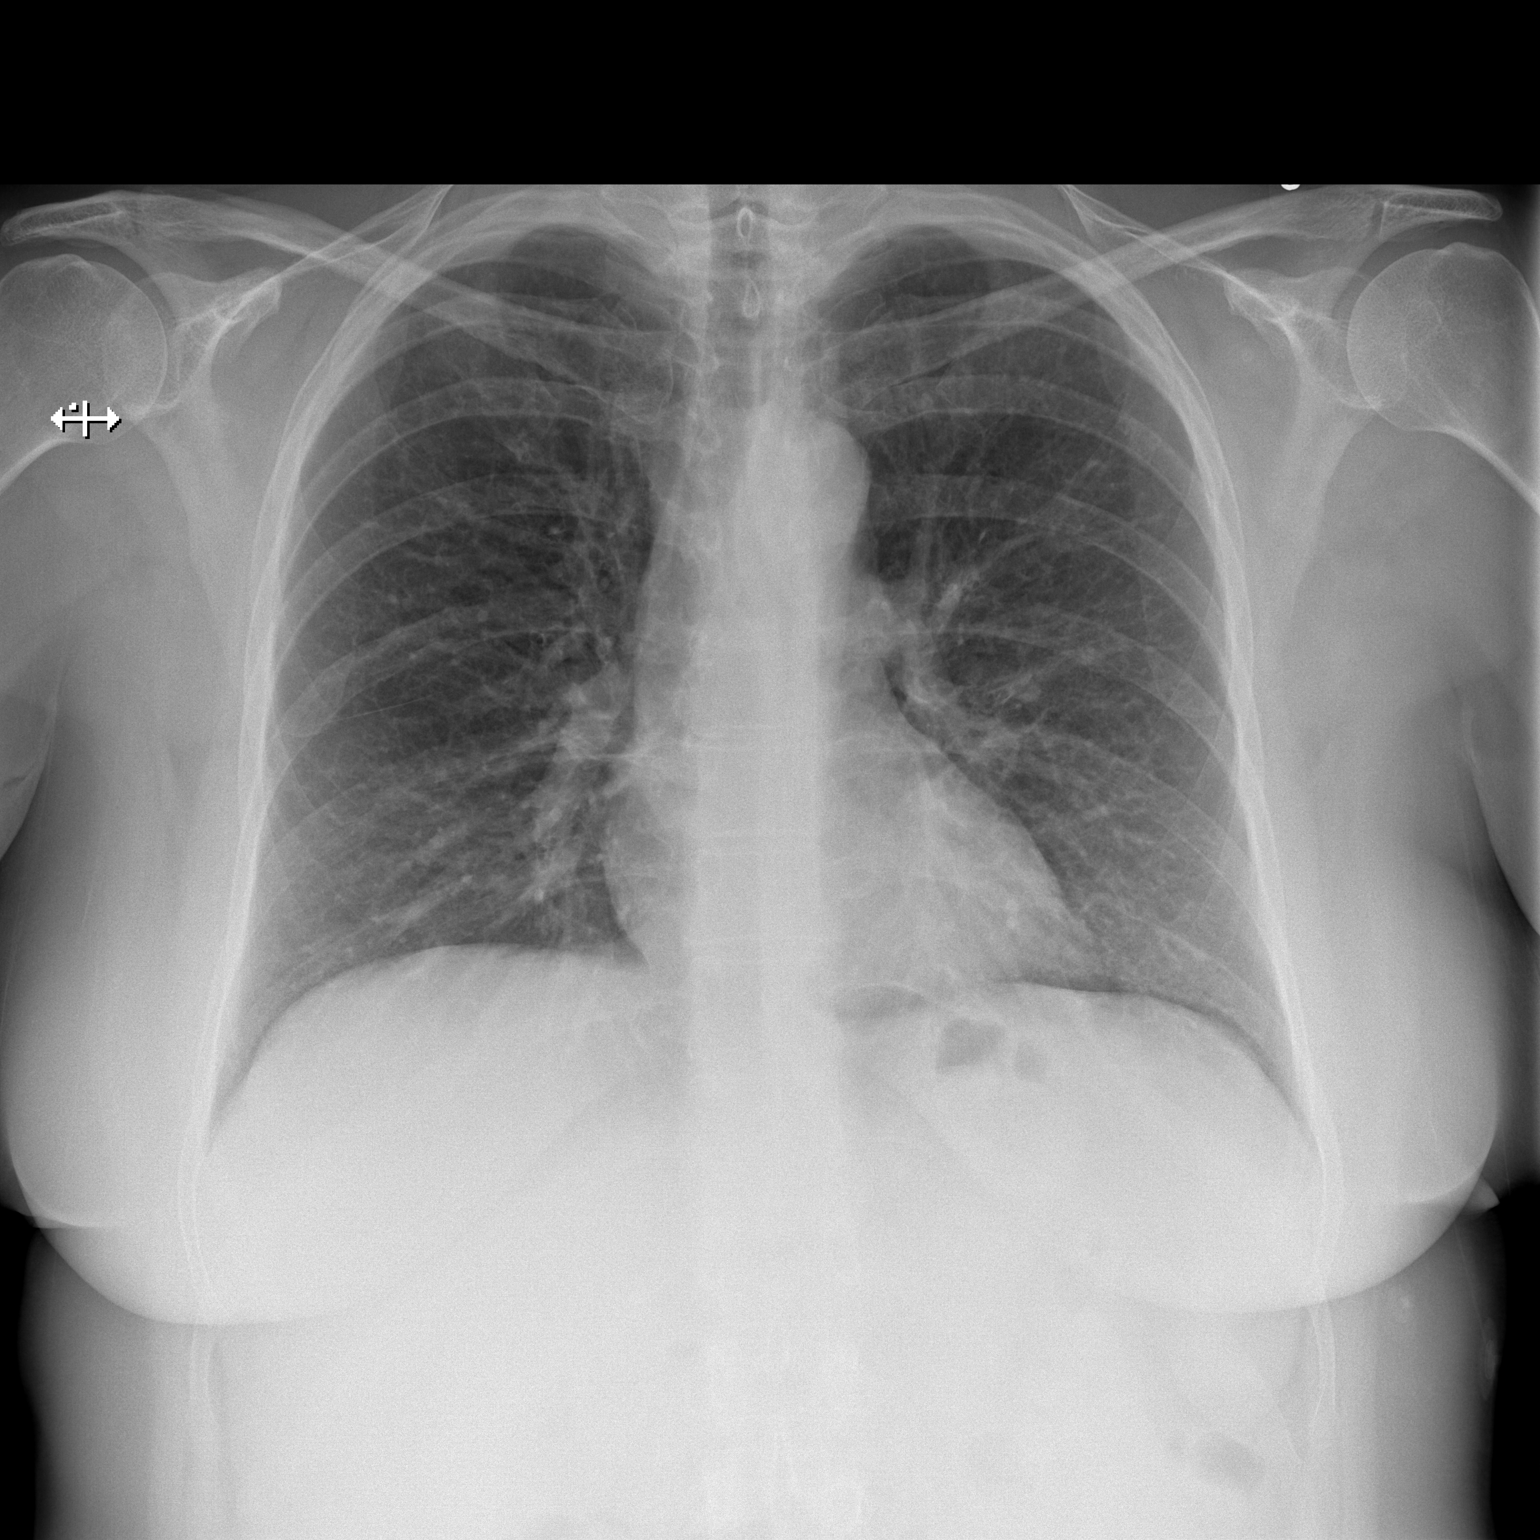

[w chest lat]
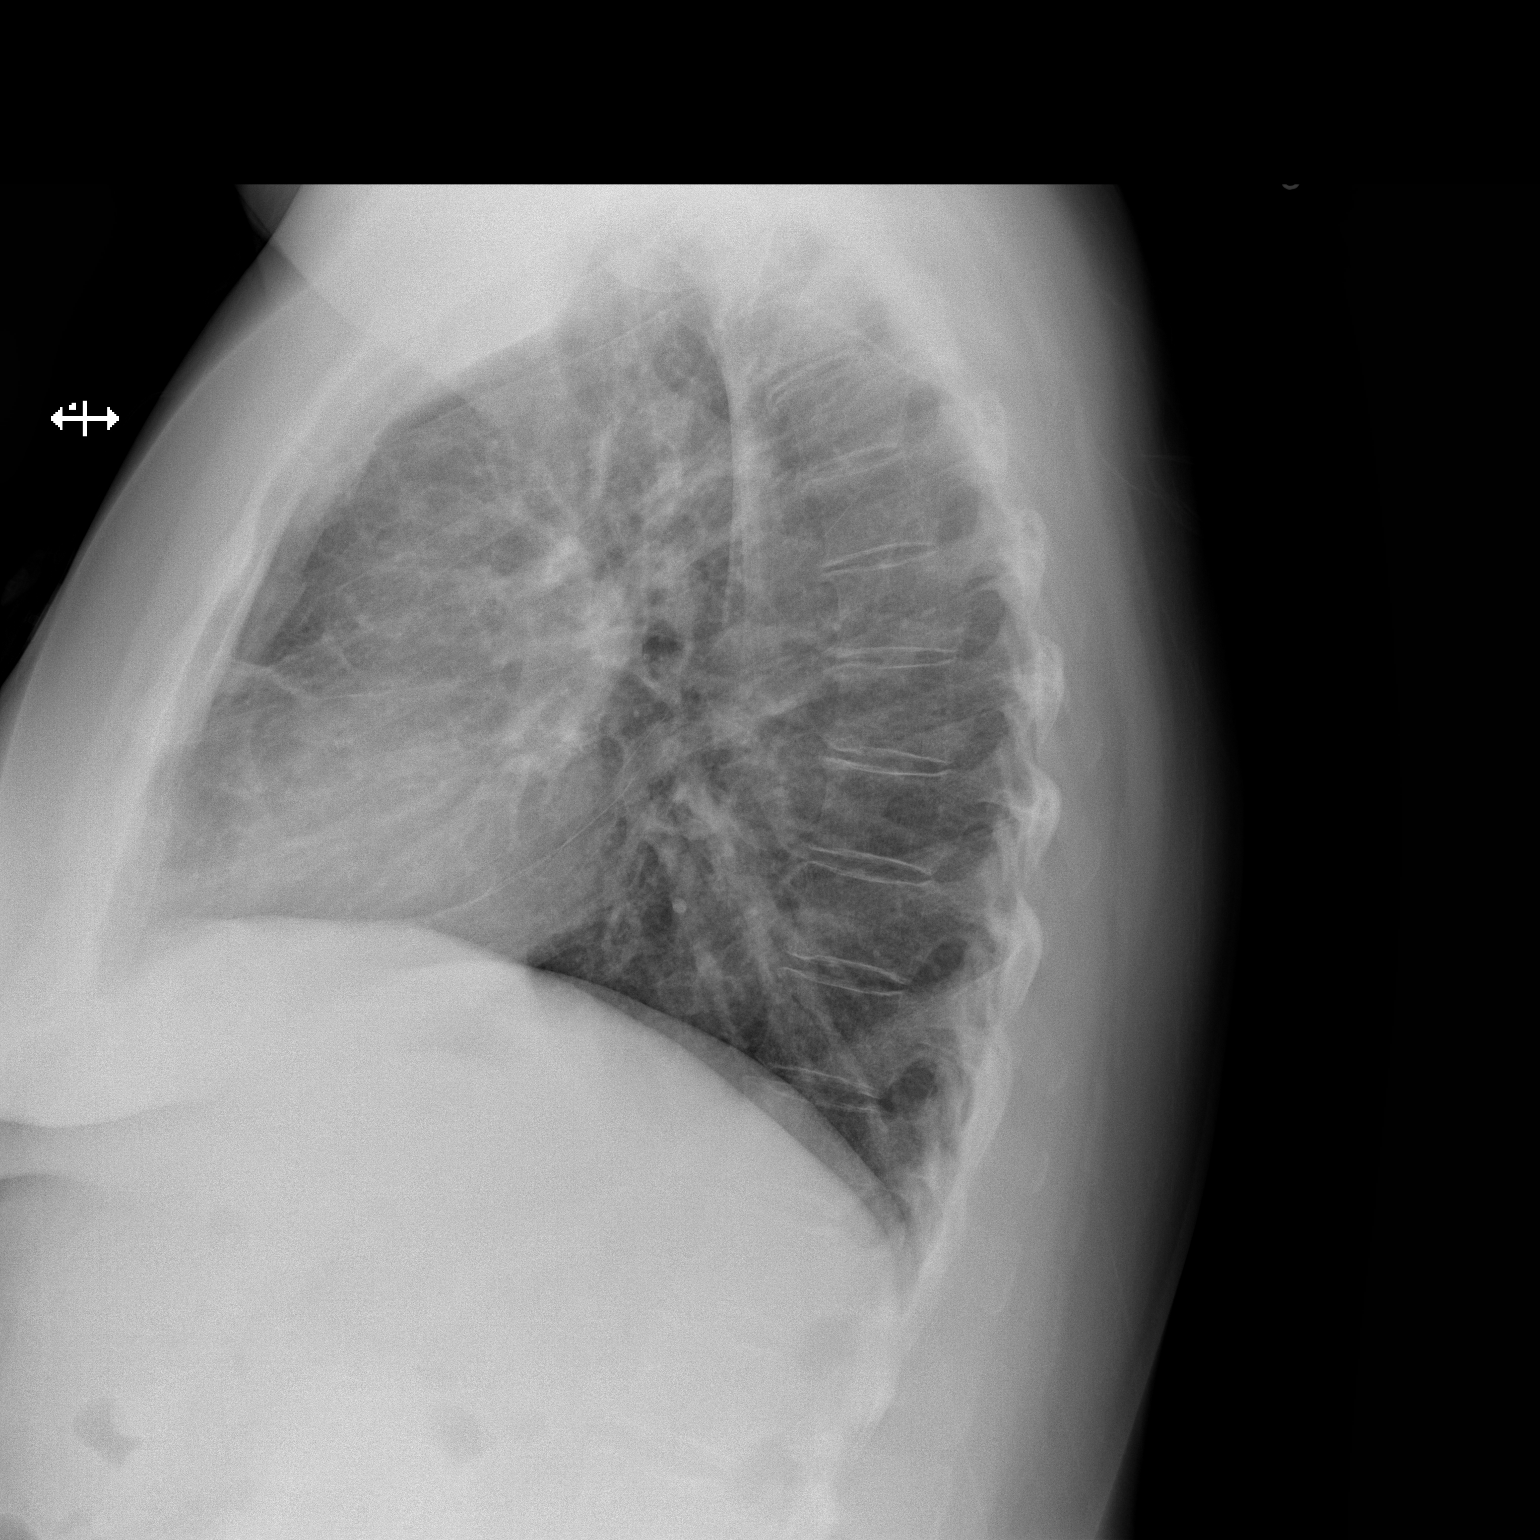

[2 of 2 positions shown; findings below may reference images not displayed]

FINDINGS: Mild interstitial coarsening/peribronchial thickening. No confluent
airspace opacity, pleural effusion, or pneumothorax.
Cardiomediastinal contours within normal range. No acute osseous
finding.
IMPRESSION: Mild peribronchial thickening, may reflect acute and/or chronic
bronchitis.

## 2016-04-24 ENCOUNTER — Emergency Department (HOSPITAL_COMMUNITY): Payer: BLUE CROSS/BLUE SHIELD

## 2016-04-24 ENCOUNTER — Observation Stay (HOSPITAL_COMMUNITY)
Admission: EM | Admit: 2016-04-24 | Discharge: 2016-04-25 | Disposition: A | Discharge status: Home / Self Care | Payer: BLUE CROSS/BLUE SHIELD | Attending: Internal Medicine | Admitting: Internal Medicine

## 2016-04-24 ENCOUNTER — Encounter (HOSPITAL_COMMUNITY): Payer: Self-pay

## 2016-04-24 ENCOUNTER — Other Ambulatory Visit: Payer: Self-pay

## 2016-04-24 DIAGNOSIS — R079 Chest pain, unspecified: Secondary | ICD-10-CM | POA: Diagnosis not present

## 2016-04-24 DIAGNOSIS — R778 Other specified abnormalities of plasma proteins: Secondary | ICD-10-CM | POA: Diagnosis not present

## 2016-04-24 DIAGNOSIS — J441 Chronic obstructive pulmonary disease with (acute) exacerbation: Secondary | ICD-10-CM | POA: Diagnosis not present

## 2016-04-24 DIAGNOSIS — J45901 Unspecified asthma with (acute) exacerbation: Secondary | ICD-10-CM | POA: Diagnosis not present

## 2016-04-24 DIAGNOSIS — F332 Major depressive disorder, recurrent severe without psychotic features: Secondary | ICD-10-CM | POA: Diagnosis present

## 2016-04-24 DIAGNOSIS — R0602 Shortness of breath: Secondary | ICD-10-CM | POA: Diagnosis present

## 2016-04-24 DIAGNOSIS — F17200 Nicotine dependence, unspecified, uncomplicated: Secondary | ICD-10-CM | POA: Diagnosis present

## 2016-04-24 DIAGNOSIS — E785 Hyperlipidemia, unspecified: Secondary | ICD-10-CM | POA: Diagnosis not present

## 2016-04-24 DIAGNOSIS — Z79899 Other long term (current) drug therapy: Secondary | ICD-10-CM | POA: Diagnosis not present

## 2016-04-24 DIAGNOSIS — F329 Major depressive disorder, single episode, unspecified: Secondary | ICD-10-CM | POA: Insufficient documentation

## 2016-04-24 DIAGNOSIS — R7989 Other specified abnormal findings of blood chemistry: Secondary | ICD-10-CM

## 2016-04-24 DIAGNOSIS — F419 Anxiety disorder, unspecified: Secondary | ICD-10-CM | POA: Diagnosis not present

## 2016-04-24 DIAGNOSIS — R0600 Dyspnea, unspecified: Secondary | ICD-10-CM | POA: Diagnosis not present

## 2016-04-24 DIAGNOSIS — Z7951 Long term (current) use of inhaled steroids: Secondary | ICD-10-CM | POA: Diagnosis not present

## 2016-04-24 DIAGNOSIS — F17210 Nicotine dependence, cigarettes, uncomplicated: Secondary | ICD-10-CM | POA: Diagnosis not present

## 2016-04-24 DIAGNOSIS — I10 Essential (primary) hypertension: Secondary | ICD-10-CM | POA: Diagnosis not present

## 2016-04-24 HISTORY — DX: Unspecified asthma, uncomplicated: J45.909

## 2016-04-24 LAB — CBC
HEMATOCRIT: 39.7 % (ref 36.0–46.0)
HEMOGLOBIN: 13.5 g/dL (ref 12.0–15.0)
MCH: 30.8 pg (ref 26.0–34.0)
MCHC: 34 g/dL (ref 30.0–36.0)
MCV: 90.4 fL (ref 78.0–100.0)
Platelets: 249 10*3/uL (ref 150–400)
RBC: 4.39 MIL/uL (ref 3.87–5.11)
RDW: 12.6 % (ref 11.5–15.5)
WBC: 6.2 10*3/uL (ref 4.0–10.5)

## 2016-04-24 LAB — I-STAT TROPONIN, ED: Troponin i, poc: 0.12 ng/mL (ref 0.00–0.08)

## 2016-04-24 LAB — BASIC METABOLIC PANEL
ANION GAP: 5 (ref 5–15)
BUN: 14 mg/dL (ref 6–20)
CHLORIDE: 110 mmol/L (ref 101–111)
CO2: 26 mmol/L (ref 22–32)
Calcium: 9.2 mg/dL (ref 8.9–10.3)
Creatinine, Ser: 0.77 mg/dL (ref 0.44–1.00)
GFR calc non Af Amer: >60 mL/min (ref >60)
Glucose, Bld: 104 mg/dL — ABNORMAL HIGH (ref 65–99)
Potassium: 4 mmol/L (ref 3.5–5.1)
Sodium: 141 mmol/L (ref 135–145)

## 2016-04-24 LAB — TROPONIN I: Troponin I: <0.03 ng/mL (ref <0.031)

## 2016-04-24 LAB — D-DIMER, QUANTITATIVE (NOT AT ARMC): D DIMER QUANT: 0.48 ug{FEU}/mL (ref 0.00–0.50)

## 2016-04-24 MED ORDER — ENOXAPARIN SODIUM 40 MG/0.4ML ~~LOC~~ SOLN
40.0000 mg | SUBCUTANEOUS | Status: DC
  Administered 2016-04-24: 40 mg via SUBCUTANEOUS
  Filled 2016-04-24 (×3): qty 0.4

## 2016-04-24 MED ORDER — ACETAMINOPHEN 325 MG PO TABS
650.0000 mg | ORAL_TABLET | ORAL | Status: DC | PRN
  Administered 2016-04-25: 650 mg via ORAL
  Filled 2016-04-24: qty 2

## 2016-04-24 MED ORDER — VITAMIN D 1000 UNITS PO TABS
5000.0000 [IU] | ORAL_TABLET | Freq: Every day | ORAL | Status: DC
  Administered 2016-04-25: 5000 [IU] via ORAL
  Filled 2016-04-24 (×2): qty 5

## 2016-04-24 MED ORDER — GUAIFENESIN ER 600 MG PO TB12: 600.0000 mg | ORAL_TABLET | Freq: Two times a day (BID) | ORAL | Status: DC | PRN

## 2016-04-24 MED ORDER — MORPHINE SULFATE (PF) 2 MG/ML IV SOLN: 2.0000 mg | INTRAVENOUS | Status: DC | PRN

## 2016-04-24 MED ORDER — METOPROLOL TARTRATE 25 MG PO TABS
12.5000 mg | ORAL_TABLET | Freq: Two times a day (BID) | ORAL | Status: DC
  Administered 2016-04-24: 12.5 mg via ORAL
  Filled 2016-04-24: qty 1

## 2016-04-24 MED ORDER — ASPIRIN 81 MG PO CHEW
81.0000 mg | CHEWABLE_TABLET | Freq: Every day | ORAL | Status: DC
  Administered 2016-04-25: 81 mg via ORAL
  Filled 2016-04-24: qty 1

## 2016-04-24 MED ORDER — IPRATROPIUM-ALBUTEROL 0.5-2.5 (3) MG/3ML IN SOLN: 3.0000 mL | RESPIRATORY_TRACT | Status: DC | PRN

## 2016-04-24 MED ORDER — PREDNISONE 20 MG PO TABS
60.0000 mg | ORAL_TABLET | Freq: Once | ORAL | Status: AC
  Administered 2016-04-24: 60 mg via ORAL
  Filled 2016-04-24: qty 3

## 2016-04-24 MED ORDER — ALBUTEROL SULFATE (2.5 MG/3ML) 0.083% IN NEBU
5.0000 mg | INHALATION_SOLUTION | Freq: Once | RESPIRATORY_TRACT | Status: AC
  Administered 2016-04-24: 5 mg via RESPIRATORY_TRACT
  Filled 2016-04-24: qty 6

## 2016-04-24 MED ORDER — IPRATROPIUM-ALBUTEROL 0.5-2.5 (3) MG/3ML IN SOLN
3.0000 mL | Freq: Once | RESPIRATORY_TRACT | Status: AC
  Administered 2016-04-24: 3 mL via RESPIRATORY_TRACT
  Filled 2016-04-24: qty 3

## 2016-04-24 MED ORDER — ASPIRIN 81 MG PO CHEW
324.0000 mg | CHEWABLE_TABLET | Freq: Once | ORAL | Status: AC
  Administered 2016-04-24: 324 mg via ORAL
  Filled 2016-04-24: qty 4

## 2016-04-24 MED ORDER — TRAZODONE HCL 50 MG PO TABS
150.0000 mg | ORAL_TABLET | Freq: Every day | ORAL | Status: DC
  Administered 2016-04-24: 150 mg via ORAL
  Filled 2016-04-24: qty 3
  Filled 2016-04-24: qty 1

## 2016-04-24 MED ORDER — ATORVASTATIN CALCIUM 40 MG PO TABS
40.0000 mg | ORAL_TABLET | Freq: Every day | ORAL | Status: DC
  Administered 2016-04-24: 40 mg via ORAL
  Filled 2016-04-24 (×3): qty 1

## 2016-04-24 MED ORDER — MONTELUKAST SODIUM 10 MG PO TABS
10.0000 mg | ORAL_TABLET | Freq: Every day | ORAL | Status: DC
  Administered 2016-04-25: 10 mg via ORAL
  Filled 2016-04-24 (×2): qty 1

## 2016-04-24 MED ORDER — ALPRAZOLAM 1 MG PO TABS
1.0000 mg | ORAL_TABLET | Freq: Every day | ORAL | Status: DC
  Administered 2016-04-24: 1 mg via ORAL
  Filled 2016-04-24: qty 1

## 2016-04-24 MED ORDER — MOMETASONE FURO-FORMOTEROL FUM 200-5 MCG/ACT IN AERO
2.0000 | INHALATION_SPRAY | Freq: Two times a day (BID) | RESPIRATORY_TRACT | Status: DC
  Administered 2016-04-25: 2 via RESPIRATORY_TRACT
  Filled 2016-04-24: qty 8.8

## 2016-04-24 MED ORDER — ONDANSETRON HCL 4 MG/2ML IJ SOLN: 4.0000 mg | Freq: Four times a day (QID) | INTRAMUSCULAR | Status: DC | PRN

## 2016-04-24 MED ORDER — ENSURE ENLIVE PO LIQD: 237.0000 mL | Freq: Two times a day (BID) | ORAL | Status: DC

## 2016-04-24 MED ORDER — PREDNISONE 50 MG PO TABS
50.0000 mg | ORAL_TABLET | Freq: Every day | ORAL | Status: DC
  Administered 2016-04-25: 50 mg via ORAL
  Filled 2016-04-24: qty 1

## 2016-04-24 MED ORDER — PREDNISONE 20 MG PO TABS: 40.0000 mg | ORAL_TABLET | Freq: Every day | ORAL | Status: DC

## 2016-04-24 NOTE — ED Provider Notes (Signed)
CSN: [id number]     Arrival date & time 04/24/16  1428 History   First MD Initiated Contact with Patient 04/24/16 1631     Chief Complaint  Patient presents with  . Shortness of Breath    HPI   Lori Owens is an 55 y.o. female with history of COPD who presents to the ED for evaluation of chest pain and shortness of breath over the past week or two, particularly worse over the past 2-3 days. She states that the pain is in her left side of her chest, does not radiate. It is intermittent. It is worse with exertion. Her shortness of breath she reports is associated with cough increased from baseline. She has an albuterol nebulizer and rescue inhaler at home which she has been using q4h over the past two days with no relief. At baseline she states she rarely requires either of these medications. She states she feels like her legs swell intermittently but denies any leg swelling or pain currently. She admits to smoking 13 cigarettes daily. She denies h/o blood clots, recent travel, or malignancy. She does not follow regularly with pulmonology. Denies fever, chills, diaphoresis, n/v/d, abdominal pain.  Past Medical History  Diagnosis Date  . Depression 01/18/2013  . Tobacco abuse 01/18/2013  . Headache(784.0)   . COPD (chronic obstructive pulmonary disease) ([place])   . Heart murmur   . Melanoma ([place])   . Seasonal allergies   . Hypertension    Past Surgical History  Procedure Laterality Date  . Throat surgery    . Abdominal hysterectomy    . Nose surgery      polyp removal from nasal cavity   . Tonsillectomy     Family History  Problem Relation Age of Onset  . Diabetes Maternal Grandfather   . Heart disease Maternal Grandfather   . Cancer Maternal Grandfather   . Hypertension Maternal Grandfather    Social History  Substance Use Topics  . Smoking status: Current Every Day Smoker -- 0.75 packs/day for 24 years    Types: Cigarettes  . Smokeless tobacco: Never Used  . Alcohol Use: No    OB History    No data available     Review of Systems  All other systems reviewed and are negative.     Allergies  Escitalopram oxalate  Home Medications   Prior to Admission medications   Medication Sig Start Date End Date Taking? Authorizing Provider  albuterol (PROVENTIL HFA;VENTOLIN HFA) 108 (90 BASE) MCG/ACT inhaler Inhale 2 puffs into the lungs every 6 (six) hours as needed for wheezing or shortness of breath. 09/05/14  Yes Orson Eva, MD  albuterol (PROVENTIL) (2.5 MG/3ML) 0.083% nebulizer solution Take 2.5 mg by nebulization every 6 (six) hours as needed for wheezing or shortness of breath.   Yes Historical Provider, MD  ALPRAZolam Duanne Moron) 0.5 MG tablet Take 1 mg by mouth at bedtime.  04/15/15  Yes Historical Provider, MD  aspirin 81 MG tablet Take 81 mg by mouth daily.    Yes Historical Provider, MD  Cholecalciferol 5000 units capsule Take 5,000 Units by mouth daily.  05/30/15  Yes Historical Provider, MD  Fluticasone-Salmeterol (ADVAIR) 250-50 MCG/DOSE AEPB Inhale 1 puff into the lungs 2 (two) times daily.   Yes Historical Provider, MD  SINGULAIR 10 MG tablet Take 1 tablet (10 mg total) by mouth at bedtime. Patient taking differently: Take 10 mg by mouth daily.  05/12/15  Yes Niel Hummer, NP  traZODone (DESYREL) 150 MG tablet Take  150 mg by mouth at bedtime.   Yes Historical Provider, MD  budesonide-formoterol (SYMBICORT) 160-4.5 MCG/ACT inhaler Take 2 puffs first thing in am and then another 2 puffs about 12 hours later. Patient not taking: Reported on 04/24/2016 05/12/15   Niel Hummer, NP  HYDROcodone-acetaminophen (NORCO) 5-325 MG tablet Take 1 tablet by mouth every 4 (four) hours as needed. Patient not taking: Reported on 04/24/2016 08/17/15   Daleen Bo, MD  nicotine (NICODERM CQ - DOSED IN MG/24 HOURS) 21 mg/24hr patch Place 1 patch (21 mg total) onto the skin daily. Patient not taking: Reported on 04/24/2016 05/12/15   Niel Hummer, NP  sertraline (ZOLOFT) 25 MG tablet  Take 1 tablet (25 mg total) by mouth daily with breakfast. Patient not taking: Reported on 04/24/2016 05/12/15   Niel Hummer, NP   BP 136/90 mmHg  Pulse 99  Temp(Src) 98 F (36.7 C) (Oral)  Resp 16  Ht 5\' 6"  (1.676 m)  Wt 80.287 kg  BMI 28.58 kg/m2  SpO2 94% Physical Exam  Constitutional: She is oriented to person, place, and time.  HENT:  Right Ear: External ear normal.  Left Ear: External ear normal.  Nose: Nose normal.  Mouth/Throat: Oropharynx is clear and moist. No oropharyngeal exudate.  Eyes: Conjunctivae and EOM are normal. Pupils are equal, round, and reactive to light.  Neck: Normal range of motion. Neck supple.  Cardiovascular: Normal rate, regular rhythm, normal heart sounds and intact distal pulses.   Pulmonary/Chest: Effort normal.  No increased WOB or tachypnea Bilateral expiratory wheezing in all lung fields Bilateral rales and rhonchi in bilateral bases  Abdominal: Soft. Bowel sounds are normal. She exhibits no distension. There is no tenderness. There is no rebound and no guarding.  Musculoskeletal: She exhibits no edema.  No LE edema No calf tenderness  Neurological: She is alert and oriented to person, place, and time. No cranial nerve deficit.  Skin: Skin is warm and dry.  Psychiatric: She has a normal mood and affect.  Nursing note and vitals reviewed.   ED Course  Procedures (including critical care time) Labs Review Labs Reviewed  BASIC METABOLIC PANEL - Abnormal; Notable for the following:    Glucose, Bld 104 (*)    All other components within normal limits  I-STAT TROPOININ, ED - Abnormal; Notable for the following:    Troponin i, poc 0.12 (*)    All other components within normal limits  CBC  D-DIMER, QUANTITATIVE (NOT AT [facility])  TROPONIN I  TROPONIN I  Randolm Idol, ED    Imaging Review Dg Chest 2 View  04/24/2016  CLINICAL DATA:  Shortness of breath 2 days with cough 1 week and mild chest tightness. Smoker. EXAM: CHEST  2 VIEW  COMPARISON:  08/12/2015 FINDINGS: Lungs adequately inflated without focal consolidation or effusion. Cardiomediastinal silhouette is within normal. Remaining bones and soft tissues are within normal. IMPRESSION: No active cardiopulmonary disease. Electronically Signed   By: Marin Olp M.D.   On: 04/24/2016 16:52   I have personally reviewed and evaluated these images and lab results as part of my medical decision-making.   EKG Interpretation None      MDM   Final diagnoses:  COPD exacerbation (HCC)  Shortness of breath  Exertional chest pain  Elevated troponin    Initial basic labs including CBC and BMP drawn in triage were negative. CXR is negative. EKG is nonacute. I added a troponin and d-dimer. Discussed d-dimer vs CTA with pt and she would  like to start with d-dimer. She last had a CTA chest a couple years ago which was negative. In the meantime will also give a duoneb and dose of prednisone. She has no hypoxia or increased WOB on room air. She maintains an SpO2 of 95-97% during our initial conversation and is breathing comfortably. Spo2 94-97% on ambulation with me. Denies SOB or chest pain on ambulation.  Repeat troponin (i-stat was drawn due to mix-up) came back 0.12. I spoke with Dr. Margaretha Seeds of cardiology who suspects likely demand ischemia secondary to COPD exacerbation. Admit to medicine with plans for serial troponins. Cards to consult as needed. I will call the hospitalist team for admission.  I spoke with Dr. Myna Hidalgo who will evaluate pt and admit to the medicine service. Appreciate assistance.  Anne Ng, PA-C 04/24/16 2022  Orlie Dakin, MD 04/25/16 [phone]

## 2016-04-24 NOTE — ED Provider Notes (Signed)
Plains of shortness of breath onset 2-3 days ago, constant, she's been using her home albuterol, without relief she also reports chest discomfort anterior, nonpleuritic, worse with walking and improved with treatment with her albuterol nebulizer. Other associated symptoms include nonproductive cough.. On exam no distress HEENT exam no patient Hx symmetry neck supple no JVD no bruit lungs scant dry crackles speaks in paragraphs abdomen nondistended nontender extremities without edema  Date: 04/24/2016  Rate: 75  Rhythm: normal sinus rhythm  QRS Axis: normal  Intervals: normal  ST/T Wave abnormalities: normal  Conduction Disutrbances: none  Narrative Interpretation: unremarkable      Orlie Dakin, MD 04/24/16 [phone]

## 2016-04-24 NOTE — ED Notes (Signed)
She c/o increasing shortness of breath x 2-3 days.  She states "I know I have COPD".  She is in no distress.

## 2016-04-24 NOTE — Discharge Instructions (Signed)
Please follow up with pulmonology and cardiology as soon as possible. Please also follow up with your primary care provider. Return to the ER for new or worsening symptoms.

## 2016-04-24 NOTE — H&P (Signed)
History and Physical    Lori Owens [id number] DOB: October 14, 1961 DOA: 04/24/2016  PCP: Rachell Cipro, MD   Patient coming from: Home   Chief Complaint: Chest pain, SOB   HPI: Lori Owens is a 55 y.o. female with medical history significant for asthma, tobacco abuse, allergic rhinitis, depression, and anxiety presenting to the ED with 1-2 weeks of exertional left-sided chest pain and SOB, worsening over past 2-3 days. Patient reports being in her usual state of health until noting the insidious development of exertional dyspnea, chest pain, and cough 1-2 weeks ago. Symptoms did not significantly impact her usual routine of walking her dog until the last 2-3 days, over which interval her activity has become severely limited by dyspnea. Pain is described as localized to the left anterior chest, severe at time, worse with exertion, not impacted by cough or deep inspiration, lasting 15-20 minutes, and without appreciable alleviating factors. She has been using her albuterol nebulizer and MDI with increasing frequency, but with no significant relief. She reports family history of heart disease in a grandparent. She also endorses history of elevated cholesterol that she has addressed with diet and exercise for the past 5-6 mos with reported intentional loss of 25 pounds. She has experienced similar symptoms previously and underwent evaluation with echocardiography and exercise tolerance test in Fall 2016. TTE demonstrated preserved EF, grade 1 diastolic dysfunction, and no significant valvular disease. ETT was interpreted as a normal study without evidence for ischemia.   ED Course: Upon arrival to the ED, patient is found to be afebrile, saturating adequately on room air, and with vital signs stable. She is dyspneic, but chest pain-free on arrival. EKG features a sinus rhythm with no acute ischemic features and initial troponin is <0.03. CXR is negative for acute cardiopulmonary disease. BMP and CBC  are unremarkable and d-dimer is negative. A second troponin measurement was obtained using POC device and returns elevated to 0.12. ED PA discussed the case with cardiology who indicated that the troponin elevation likely reflects a demand ischemia. Admission to the hospitalist service was advised for serial cardiac biomarker measurements and formal cardiology consultation as needed. Aspirin 324 mg chew, nebulized albuterol, and 60 mg PO prednisone was administered in the ED. The patient has remained chest pain-free and hemodynamically stable in the ED and will be observed on the telemetry unit for ongoing evaluation and management of dyspnea and CP concerning for possible ACS.   Review of Systems:  All other systems reviewed and apart from HPI, are negative.  Past Medical History  Diagnosis Date  . Depression 01/18/2013  . Tobacco abuse 01/18/2013  . Headache(784.0)   . Heart murmur   . Melanoma ([place])   . Seasonal allergies   . Hypertension   . Asthma     Past Surgical History  Procedure Laterality Date  . Throat surgery    . Abdominal hysterectomy    . Nose surgery      polyp removal from nasal cavity   . Tonsillectomy       reports that she has been smoking Cigarettes.  She has a 18 pack-year smoking history. She has never used smokeless tobacco. She reports that she does not drink alcohol or use illicit drugs.  Allergies  Allergen Reactions  . Escitalopram Oxalate     Suicidal.    Family History  Problem Relation Age of Onset  . Diabetes Maternal Grandfather   . Heart disease Maternal Grandfather   . Cancer Maternal Grandfather   . Hypertension  Maternal Grandfather      Prior to Admission medications   Medication Sig Start Date End Date Taking? Authorizing Provider  albuterol (PROVENTIL HFA;VENTOLIN HFA) 108 (90 BASE) MCG/ACT inhaler Inhale 2 puffs into the lungs every 6 (six) hours as needed for wheezing or shortness of breath. 09/05/14  Yes Orson Eva, MD  albuterol  (PROVENTIL) (2.5 MG/3ML) 0.083% nebulizer solution Take 2.5 mg by nebulization every 6 (six) hours as needed for wheezing or shortness of breath.   Yes Historical Provider, MD  ALPRAZolam Duanne Moron) 0.5 MG tablet Take 1 mg by mouth at bedtime.  04/15/15  Yes Historical Provider, MD  aspirin 81 MG tablet Take 81 mg by mouth daily.    Yes Historical Provider, MD  Cholecalciferol 5000 units capsule Take 5,000 Units by mouth daily.  05/30/15  Yes Historical Provider, MD  Fluticasone-Salmeterol (ADVAIR) 250-50 MCG/DOSE AEPB Inhale 1 puff into the lungs 2 (two) times daily.   Yes Historical Provider, MD  SINGULAIR 10 MG tablet Take 1 tablet (10 mg total) by mouth at bedtime. Patient taking differently: Take 10 mg by mouth daily.  05/12/15  Yes Niel Hummer, NP  traZODone (DESYREL) 150 MG tablet Take 150 mg by mouth at bedtime.   Yes Historical Provider, MD  budesonide-formoterol (SYMBICORT) 160-4.5 MCG/ACT inhaler Take 2 puffs first thing in am and then another 2 puffs about 12 hours later. Patient not taking: Reported on 04/24/2016 05/12/15   Niel Hummer, NP  guaiFENesin (MUCINEX) 600 MG 12 hr tablet Take 1 tablet (600 mg total) by mouth 2 (two) times daily as needed for to loosen phlegm. 04/24/16   Anne Ng, PA-C  HYDROcodone-acetaminophen (NORCO) 5-325 MG tablet Take 1 tablet by mouth every 4 (four) hours as needed. Patient not taking: Reported on 04/24/2016 08/17/15   Daleen Bo, MD  nicotine (NICODERM CQ - DOSED IN MG/24 HOURS) 21 mg/24hr patch Place 1 patch (21 mg total) onto the skin daily. Patient not taking: Reported on 04/24/2016 05/12/15   Niel Hummer, NP  predniSONE (DELTASONE) 20 MG tablet Take 2 tablets (40 mg total) by mouth daily. 04/24/16   Olivia Canter Sam, PA-C  sertraline (ZOLOFT) 25 MG tablet Take 1 tablet (25 mg total) by mouth daily with breakfast. Patient not taking: Reported on 04/24/2016 05/12/15   Niel Hummer, NP    Physical Exam: Filed Vitals:   04/24/16 1621 04/24/16 1702 04/24/16  1728 04/24/16 1903  BP:  119/83  135/96  Pulse:  73  67  Temp:  98 F (36.7 C)  98 F (36.7 C)  TempSrc:  Oral  Oral  Resp:  22  18  Height:      Weight:      SpO2: 94% 92% 94% 95%      Constitutional: NAD, calm, comfortable Eyes: PERTLA, lids and conjunctivae normal ENMT: Mucous membranes are moist. Posterior pharynx clear of any exudate or lesions.   Neck: normal, supple, no masses, no thyromegaly Respiratory: Expiratory wheezes, no rhonchi, no crackles. Normal respiratory effort. No pallor.  Cardiovascular: S1 & S2 heard, regular rate and rhythm, no significant murmurs / rubs / gallops. No extremity edema. 2+ pedal pulses. No carotid bruits. No significant JVD. Abdomen: No distension, no tenderness, no masses palpated. Bowel sounds normal.  Musculoskeletal: no clubbing / cyanosis. No joint deformity upper and lower extremities. Normal muscle tone.  Skin: no significant rashes, lesions, ulcers. Warm, dry, well-perfused. Neurologic: CN 2-12 grossly intact. Sensation intact, DTR normal. Strength 5/5 in  all 4 limbs.  Psychiatric: Normal judgment and insight. Alert and oriented x 3. Normal mood and affect.     Labs on Admission: I have personally reviewed following labs and imaging studies  CBC:  Recent Labs Lab 04/24/16 1605  WBC 6.2  HGB 13.5  HCT 39.7  MCV 90.4  PLT 0000000   Basic Metabolic Panel:  Recent Labs Lab 04/24/16 1605  NA 141  K 4.0  CL 110  CO2 26  GLUCOSE 104*  BUN 14  CREATININE 0.77  CALCIUM 9.2   GFR: Estimated Creatinine Clearance: 85.9 mL/min (by C-G formula based on Cr of 0.77). Liver Function Tests: No results for input(s): AST, ALT, ALKPHOS, BILITOT, PROT, ALBUMIN in the last 168 hours. No results for input(s): LIPASE, AMYLASE in the last 168 hours. No results for input(s): AMMONIA in the last 168 hours. Coagulation Profile: No results for input(s): INR, PROTIME in the last 168 hours. Cardiac Enzymes:  Recent Labs Lab 04/24/16 1605   TROPONINI <0.03   BNP (last 3 results) No results for input(s): PROBNP in the last 8760 hours. HbA1C: No results for input(s): HGBA1C in the last 72 hours. CBG: No results for input(s): GLUCAP in the last 168 hours. Lipid Profile: No results for input(s): CHOL, HDL, LDLCALC, TRIG, CHOLHDL, LDLDIRECT in the last 72 hours. Thyroid Function Tests: No results for input(s): TSH, T4TOTAL, FREET4, T3FREE, THYROIDAB in the last 72 hours. Anemia Panel: No results for input(s): VITAMINB12, FOLATE, FERRITIN, TIBC, IRON, RETICCTPCT in the last 72 hours. Urine analysis:    Component Value Date/Time   COLORURINE YELLOW 01/30/2015 2221   APPEARANCEUR CLEAR 01/30/2015 2221   LABSPEC 1.025 01/30/2015 2221   PHURINE 5.0 01/30/2015 2221   GLUCOSEU NEGATIVE 01/30/2015 2221   HGBUR NEGATIVE 01/30/2015 2221   BILIRUBINUR NEGATIVE 01/30/2015 2221   KETONESUR NEGATIVE 01/30/2015 2221   PROTEINUR NEGATIVE 01/30/2015 2221   UROBILINOGEN 0.2 01/30/2015 2221   NITRITE NEGATIVE 01/30/2015 2221   LEUKOCYTESUR NEGATIVE 01/30/2015 2221   Sepsis Labs: @LABRCNTIP (procalcitonin:4,lacticidven:4) )No results found for this or any previous visit (from the past 240 hour(s)).   Radiological Exams on Admission: Dg Chest 2 View  04/24/2016  CLINICAL DATA:  Shortness of breath 2 days with cough 1 week and mild chest tightness. Smoker. EXAM: CHEST  2 VIEW COMPARISON:  08/12/2015 FINDINGS: Lungs adequately inflated without focal consolidation or effusion. Cardiomediastinal silhouette is within normal. Remaining bones and soft tissues are within normal. IMPRESSION: No active cardiopulmonary disease. Electronically Signed   By: Marin Olp M.D.   On: 04/24/2016 16:52    EKG: Independently reviewed. Sinus rhythm, no acute ischemic features  Assessment/Plan  1. Chest pain, SOB  - Symptom description is certainly concerning for ACS - Troponin <0.03 -> 0.12, will continue serial measurements  - Monitor on telemetry  for ischemic changes - Repeat EKG in am, sooner for CP  - ASA 324 mg chew given in ED, will have Lopressor and statin given now in setting of potential ACS, can be discontinued as appropriate if rules-out  - Risk-stratify with lipid panel and A1c in the am  - Further eval and management dependent on overnight events and troponin trend    2. Asthma with acute exacerbation  - Pt reports history of COPD, but was evaluated by pulmonology March 2016 with PFT's and determined to have asthma and not COPD  - Presents with dyspnea for 1-2 wks, worsening of last 2-3 days; wheezes on exam  - Managed at home with Advair  and prn albuterol  - Continue LABA/ICS with Dulera while in hospital; continue albuterol neb q4h prn  - Prednisone 50 mg qD    3. Tobacco use - Counseled toward cessation  - RN asked to provide smoking cessation information prior to discharge    4. Depression, anxiety  - Appears stable, pt denies SI, HI, or hallucinations  - Hospitalized in July 2016 with MDD and SI, started on Zoloft and trazodone at that time; had previously been on Lexapro  - Has reportedly been stable in recent mos, no longer taking Zoloft  - Continue current management with trazodone qHS and Xanax qHS prn     DVT prophylaxis: sq Lovenox  Code Status: Full Family Communication: Discussed with patient  Disposition Plan: Observe on telemetry   Consults called: None Admission status: Observation     Vianne Bulls, MD [facility] Pager [phone]  If 7PM-7AM, please contact night-coverage [email or web address] Password [facility]  04/24/2016, 8:55 PM

## 2016-04-24 NOTE — ED Notes (Signed)
Pt ambulated without assistance and maintained a pulse ox above 95% on RA

## 2016-04-25 DIAGNOSIS — J45901 Unspecified asthma with (acute) exacerbation: Secondary | ICD-10-CM | POA: Diagnosis not present

## 2016-04-25 DIAGNOSIS — F17200 Nicotine dependence, unspecified, uncomplicated: Secondary | ICD-10-CM | POA: Diagnosis not present

## 2016-04-25 DIAGNOSIS — R079 Chest pain, unspecified: Secondary | ICD-10-CM | POA: Diagnosis not present

## 2016-04-25 DIAGNOSIS — F332 Major depressive disorder, recurrent severe without psychotic features: Secondary | ICD-10-CM | POA: Diagnosis not present

## 2016-04-25 LAB — LIPID PANEL
CHOL/HDL RATIO: 3.4 ratio
Cholesterol: 149 mg/dL (ref 0–200)
HDL: 44 mg/dL (ref >40)
LDL Cholesterol: 88 mg/dL (ref 0–99)
Triglycerides: 86 mg/dL (ref <150)
VLDL: 17 mg/dL (ref 0–40)

## 2016-04-25 LAB — TROPONIN I: Troponin I: <0.03 ng/mL (ref <0.031)

## 2016-04-25 MED ORDER — DM-GUAIFENESIN ER 30-600 MG PO TB12: 1.0000 | ORAL_TABLET | Freq: Two times a day (BID) | ORAL | Status: DC

## 2016-04-25 MED ORDER — NICOTINE 14 MG/24HR TD PT24: 14.0000 mg | MEDICATED_PATCH | Freq: Every day | TRANSDERMAL | Status: DC

## 2016-04-25 MED ORDER — DICLOFENAC SODIUM 1 % TD GEL
2.0000 g | Freq: Four times a day (QID) | TRANSDERMAL | Status: DC
  Administered 2016-04-25: 2 g via TOPICAL
  Filled 2016-04-25: qty 100

## 2016-04-25 MED ORDER — DM-GUAIFENESIN ER 30-600 MG PO TB12
1.0000 | ORAL_TABLET | Freq: Two times a day (BID) | ORAL | Status: DC
  Administered 2016-04-25: 1 via ORAL
  Filled 2016-04-25: qty 1

## 2016-04-25 MED ORDER — NICOTINE 14 MG/24HR TD PT24
14.0000 mg | MEDICATED_PATCH | Freq: Every day | TRANSDERMAL | Status: DC
  Administered 2016-04-25: 14 mg via TRANSDERMAL
  Filled 2016-04-25: qty 1

## 2016-04-25 NOTE — Treatment Plan (Signed)
Received call from ED provider re: elevated troponin.  Patient is being treated for a COPD exacerbation and symptoms improved with steroids and breathing treatment.  Patient's POC istat troponin was mildly elevated to 0.12.  Troponin from blood draw was previously negative.  Recommended repeating blood drawn troponin, as the POC troponin can sometimes be inaccurate.  If troponin is truly elevated, differential includes demand vs ACS. Please call with any further questions or concerns.

## 2016-04-25 NOTE — Progress Notes (Signed)
SATURATION QUALIFICATIONS: (This note is used to comply with regulatory documentation for home oxygen)  Patient Saturations on Room Air at Rest = 95%  Patient Saturations on Room Air while Ambulating = 92-96%  Patient Saturations on  Liters of oxygen while Ambulating = %

## 2016-04-25 NOTE — Progress Notes (Signed)
Reviewed discharge information with patient and caregiver. Answered all questions. Patient/caregiver able to teach back medications and reasons to contact MD/911. Patient verbalizes importance of PCP follow up appointment. Smoking cessation provided [place]. Brigitte Pulse, RN

## 2016-04-26 LAB — HEMOGLOBIN A1C
Hgb A1c MFr Bld: 5.5 % (ref 4.8–5.6)
Mean Plasma Glucose: 111 mg/dL

## 2016-04-28 NOTE — Discharge Summary (Signed)
Physician Discharge Summary  Lori Owens DQQ:[id number] DOB: Feb 09, 1961 DOA: 04/24/2016  PCP: Maryelizabeth Rowan, MD  Admit date: 04/24/2016 Discharge date: 04/28/2016  Time spent: 45 minutes   Discharge Condition: stable    Discharge Diagnoses:  Principal Problem:   Exertional chest pain Active Problems:   Asthma exacerbation   MDD (major depressive disorder), recurrent severe, without psychosis (HCC)   Tobacco use disorder   Anxiety   History of present illness:  55 y/o with asthma, tobacco abuse, depression and anxiety, hyperlipidemia being controlled with diet presented to the hospital for left sided chest pain and dyspnea with a cough 1-2 wks ago. She noted that dyspnea was limiting her ambulating and has not improved with breathing treatments. She was chest pain free in the ER and received 60 mg of Prednisone.   Hospital Course:  Chest pain on exertion/ dyspnea - Troponin POC marker was 0.12 but 4 sets of Troponin subsequently < 0.03  -  EKG on admission was non- acute - improved with Prednisone- she has been ambulating in the hall with no recurrence of symptoms - risk stratification done: normal lipid panel and Hba1c of 5.5  COPD exacerbation/ smoking - counseled to stop smoking - cont prednisone taper - nicotine patch   Discharge Exam: Filed Weights   04/24/16 1436 04/24/16 2225  Weight: 80.287 kg (177 lb) 82.01 kg (180 lb 12.8 oz)   Filed Vitals:   04/24/16 2225 04/25/16 0451  BP: 135/85 116/91  Pulse: 84 73  Temp: 99.3 F (37.4 C) 98.1 F (36.7 C)  Resp: 18 18    General: AAO x 3, no distress Cardiovascular: RRR, no murmurs  Respiratory: clear to auscultation bilaterally GI: soft, non-tender, non-distended, bowel sound positive  Discharge Instructions You were cared for by a hospitalist during your hospital stay. If you have any questions about your discharge medications or the care you received while you were in the hospital after you are discharged,  you can call the unit and asked to speak with the hospitalist on call if the hospitalist that took care of you is not available. Once you are discharged, your primary care physician will handle any further medical issues. Please note that NO REFILLS for any discharge medications will be authorized once you are discharged, as it is imperative that you return to your primary care physician (or establish a relationship with a primary care physician if you do not have one) for your aftercare needs so that they can reassess your need for medications and monitor your lab values.      Discharge Instructions    Diet - low sodium heart healthy    Complete by:  As directed      Increase activity slowly    Complete by:  As directed             Medication List    TAKE these medications        albuterol (2.5 MG/3ML) 0.083% nebulizer solution  Commonly known as:  PROVENTIL  Take 2.5 mg by nebulization every 6 (six) hours as needed for wheezing or shortness of breath.     albuterol 108 (90 Base) MCG/ACT inhaler  Commonly known as:  PROVENTIL HFA;VENTOLIN HFA  Inhale 2 puffs into the lungs every 6 (six) hours as needed for wheezing or shortness of breath.     ALPRAZolam 0.5 MG tablet  Commonly known as:  XANAX  Take 1 mg by mouth at bedtime.     aspirin 81 MG tablet  Take  81 mg by mouth daily.     Cholecalciferol 5000 units capsule  Take 5,000 Units by mouth daily.     dextromethorphan-guaiFENesin 30-600 MG 12hr tablet  Commonly known as:  MUCINEX DM  Take 1 tablet by mouth 2 (two) times daily.     Fluticasone-Salmeterol 250-50 MCG/DOSE Aepb  Commonly known as:  ADVAIR  Inhale 1 puff into the lungs 2 (two) times daily.     nicotine 14 mg/24hr patch  Commonly known as:  NICODERM CQ - dosed in mg/24 hours  Place 1 patch (14 mg total) onto the skin daily.     predniSONE 20 MG tablet  Commonly known as:  DELTASONE  Take 2 tablets (40 mg total) by mouth daily.     SINGULAIR 10 MG tablet   Generic drug:  montelukast  Take 1 tablet (10 mg total) by mouth at bedtime.     traZODone 150 MG tablet  Commonly known as:  DESYREL  Take 150 mg by mouth at bedtime.       Allergies  Allergen Reactions  . Escitalopram Oxalate     Suicidal.   Follow-up Information    Schedule an appointment as soon as possible for a visit with [facility].   Contact information:   [place] [phone]      Schedule an appointment as soon as possible for a visit with [facility].   Specialty:  Pulmonology   Contact information:   [place] [phone]      Follow up with [facility].   Specialty:  Emergency Medicine   Why:  If symptoms worsen   Contact information:   [place] [id number] [place] [id number] [phone]      Schedule an appointment as soon as possible for a visit with [facility], MD.   Specialty:  Family Medicine   Contact information:   [address] [place] [address] [phone]        The results of significant diagnostics from this hospitalization (including imaging, microbiology, ancillary and laboratory) are listed below for reference.    Significant Diagnostic Studies: Dg Chest 2 View  04/24/2016  CLINICAL DATA:  Shortness of breath 2 days with cough 1 week and mild chest tightness. Smoker. EXAM: CHEST  2 VIEW COMPARISON:  08/12/2015 FINDINGS: Lungs adequately inflated without focal consolidation or effusion. Cardiomediastinal silhouette is within normal. Remaining bones and soft tissues are within normal. IMPRESSION: No active cardiopulmonary disease. Electronically Signed   By: Marin Olp M.D.   On: 04/24/2016 16:52    Microbiology: No results found for this or any previous visit (from the past 240 hour(s)).    Labs: Basic Metabolic Panel:  Recent Labs Lab 04/24/16 1605  NA 141  K 4.0  CL 110  CO2 26  GLUCOSE 104*  BUN 14  CREATININE 0.77  CALCIUM 9.2   Liver Function Tests: No results for input(s): AST, ALT, ALKPHOS, BILITOT, PROT, ALBUMIN in the last 168 hours. No results for input(s): LIPASE, AMYLASE in the last 168 hours. No results for input(s): AMMONIA in the last 168 hours. CBC:  Recent Labs Lab 04/24/16 1605  WBC 6.2  HGB 13.5  HCT 39.7  MCV 90.4  PLT 249   Cardiac Enzymes:  Recent Labs Lab 04/24/16 1605 04/24/16 2029 04/24/16 2320 04/25/16 0206 04/25/16 0536  TROPONINI <0.03 <0.03 <0.03 <0.03 <  0.03   BNP: BNP (last 3 results) No results for input(s): BNP in the last 8760 hours.  ProBNP (last 3 results) No results for input(s): PROBNP in the last 8760 hours.  CBG: No results for input(s): GLUCAP in the last 168 hours.     SignedDebbe Odea, MD [facility] 04/28/2016, 12:20 PM

## 2016-06-07 ENCOUNTER — Other Ambulatory Visit: Payer: Self-pay | Admitting: Family Medicine

## 2016-06-07 DIAGNOSIS — N6310 Unspecified lump in the right breast, unspecified quadrant: Secondary | ICD-10-CM

## 2016-06-10 ENCOUNTER — Ambulatory Visit
Admission: RE | Admit: 2016-06-10 | Discharge: 2016-06-10 | Disposition: A | Discharge status: Home / Self Care | Payer: BLUE CROSS/BLUE SHIELD | Source: Ambulatory Visit | Attending: Family Medicine | Admitting: Family Medicine

## 2016-06-10 DIAGNOSIS — N6310 Unspecified lump in the right breast, unspecified quadrant: Secondary | ICD-10-CM

## 2016-07-06 ENCOUNTER — Ambulatory Visit
Admission: RE | Admit: 2016-07-06 | Discharge: 2016-07-06 | Disposition: A | Discharge status: Home / Self Care | Payer: BLUE CROSS/BLUE SHIELD | Source: Ambulatory Visit | Attending: Family Medicine | Admitting: Family Medicine

## 2016-07-06 ENCOUNTER — Other Ambulatory Visit: Payer: Self-pay | Admitting: Family Medicine

## 2016-07-06 DIAGNOSIS — R0989 Other specified symptoms and signs involving the circulatory and respiratory systems: Secondary | ICD-10-CM

## 2016-08-09 ENCOUNTER — Emergency Department (HOSPITAL_COMMUNITY): Payer: BLUE CROSS/BLUE SHIELD

## 2016-08-09 ENCOUNTER — Encounter (HOSPITAL_COMMUNITY): Payer: Self-pay | Admitting: Emergency Medicine

## 2016-08-09 ENCOUNTER — Emergency Department (HOSPITAL_COMMUNITY)
Admission: EM | Admit: 2016-08-09 | Discharge: 2016-08-09 | Disposition: A | Discharge status: Home / Self Care | Payer: BLUE CROSS/BLUE SHIELD | Attending: Emergency Medicine | Admitting: Emergency Medicine

## 2016-08-09 DIAGNOSIS — Y92410 Unspecified street and highway as the place of occurrence of the external cause: Secondary | ICD-10-CM | POA: Insufficient documentation

## 2016-08-09 DIAGNOSIS — F17210 Nicotine dependence, cigarettes, uncomplicated: Secondary | ICD-10-CM | POA: Insufficient documentation

## 2016-08-09 DIAGNOSIS — M545 Low back pain, unspecified: Secondary | ICD-10-CM

## 2016-08-09 DIAGNOSIS — I10 Essential (primary) hypertension: Secondary | ICD-10-CM | POA: Insufficient documentation

## 2016-08-09 DIAGNOSIS — M25561 Pain in right knee: Secondary | ICD-10-CM | POA: Diagnosis not present

## 2016-08-09 DIAGNOSIS — Z7982 Long term (current) use of aspirin: Secondary | ICD-10-CM | POA: Diagnosis not present

## 2016-08-09 DIAGNOSIS — J45909 Unspecified asthma, uncomplicated: Secondary | ICD-10-CM | POA: Diagnosis not present

## 2016-08-09 DIAGNOSIS — Y999 Unspecified external cause status: Secondary | ICD-10-CM | POA: Diagnosis not present

## 2016-08-09 DIAGNOSIS — Y939 Activity, unspecified: Secondary | ICD-10-CM | POA: Insufficient documentation

## 2016-08-09 DIAGNOSIS — M25512 Pain in left shoulder: Secondary | ICD-10-CM

## 2016-08-09 MED ORDER — ACETAMINOPHEN 325 MG PO TABS
650.0000 mg | ORAL_TABLET | Freq: Once | ORAL | Status: AC
  Administered 2016-08-09: 650 mg via ORAL
  Filled 2016-08-09: qty 2

## 2016-08-09 MED ORDER — CYCLOBENZAPRINE HCL 10 MG PO TABS: 10.0000 mg | ORAL_TABLET | Freq: Two times a day (BID) | ORAL | 0 refills | Status: DC | PRN

## 2016-08-09 MED ORDER — IBUPROFEN 800 MG PO TABS
800.0000 mg | ORAL_TABLET | Freq: Once | ORAL | Status: AC
  Administered 2016-08-09: 800 mg via ORAL
  Filled 2016-08-09: qty 1

## 2016-08-09 MED ORDER — IBUPROFEN 600 MG PO TABS: 600.0000 mg | ORAL_TABLET | Freq: Four times a day (QID) | ORAL | 0 refills | Status: DC | PRN
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
## 2016-12-22 IMAGING — US US EXTREM LOW VENOUS*R*
1 series · 14 of 24 positions shown · non-contrast
Comparison: None

CLINICAL DATA: Right leg pain and swelling, calf, x4 months.
Varicose veins.

EXAM:
RIGHT LOWER EXTREMITY VENOUS DOPPLER ULTRASOUND
TECHNIQUE: Gray-scale sonography with compression, as well as color and duplex
ultrasound, were performed to evaluate the deep venous system from
the level of the common femoral vein through the popliteal and
proximal calf veins.

[Series 1: us extrem low venous*right* · 14 of 35 slices shown]
[im 1/35]
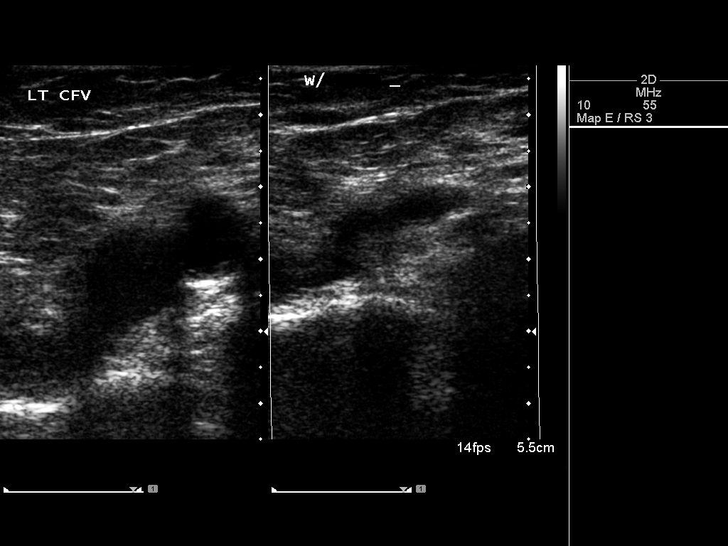
[im 3/35]
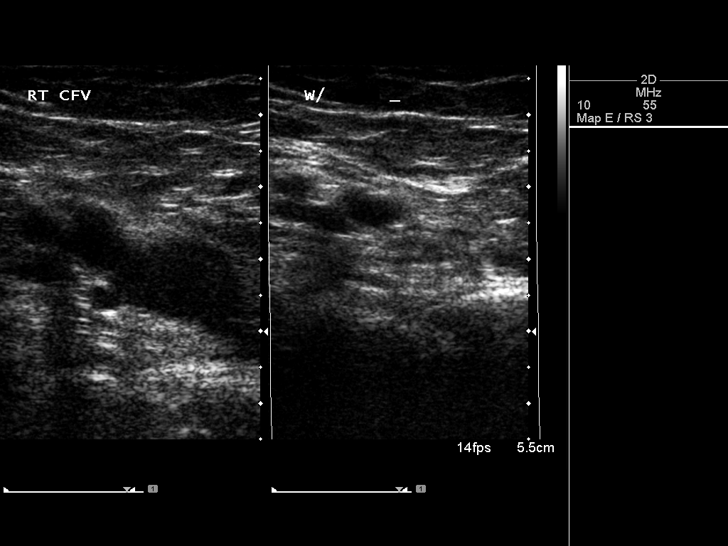
[im 6/35]
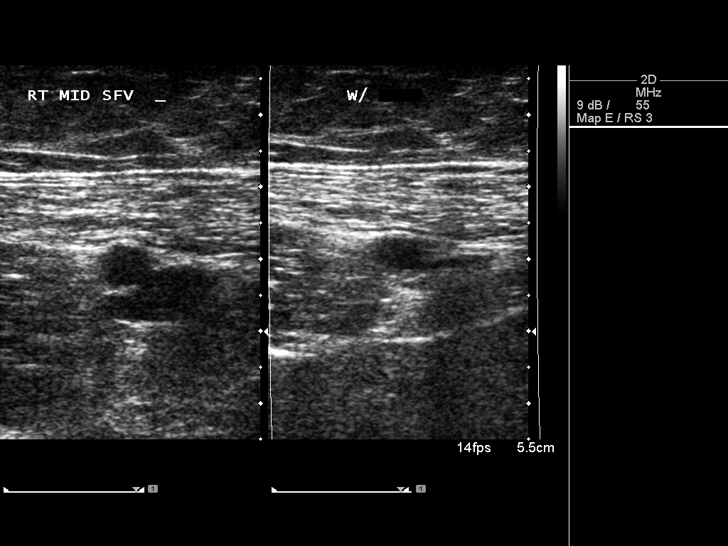
[im 9/35]
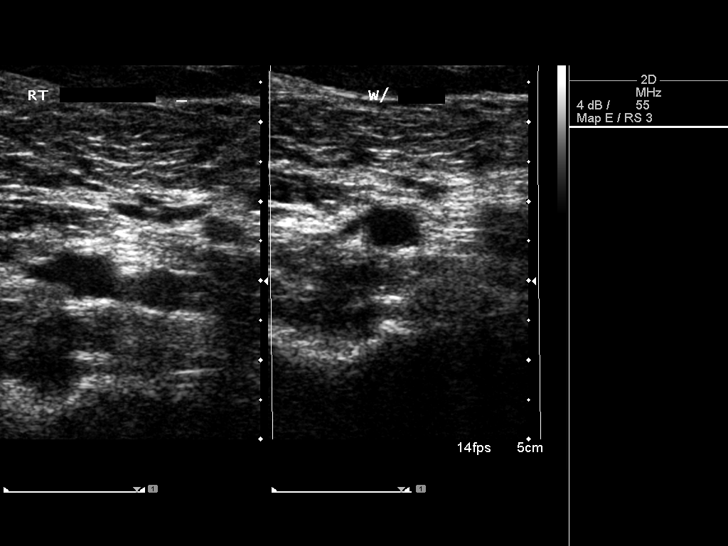
[im 11/35]
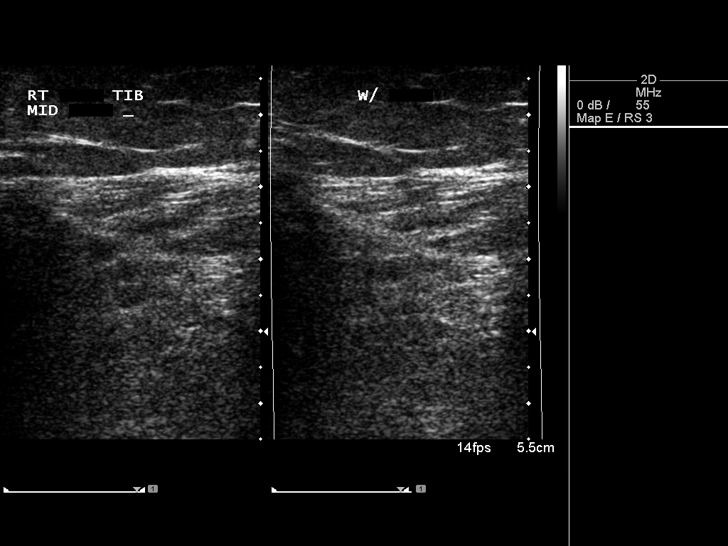
[im 14/35]
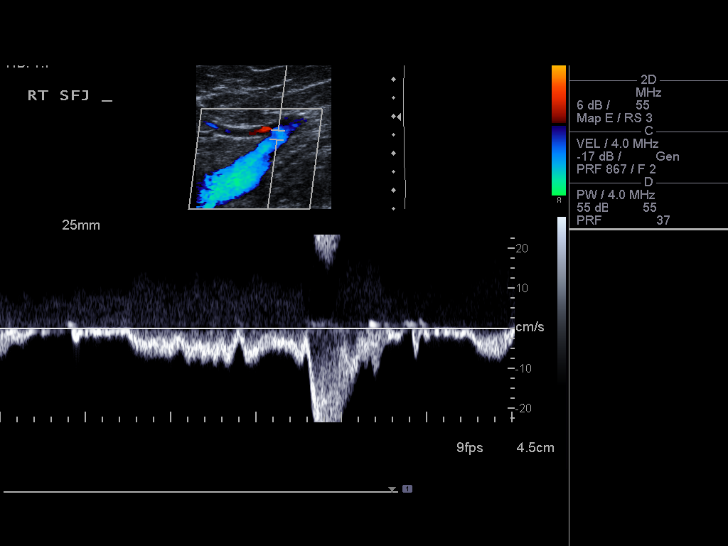
[im 17/35]
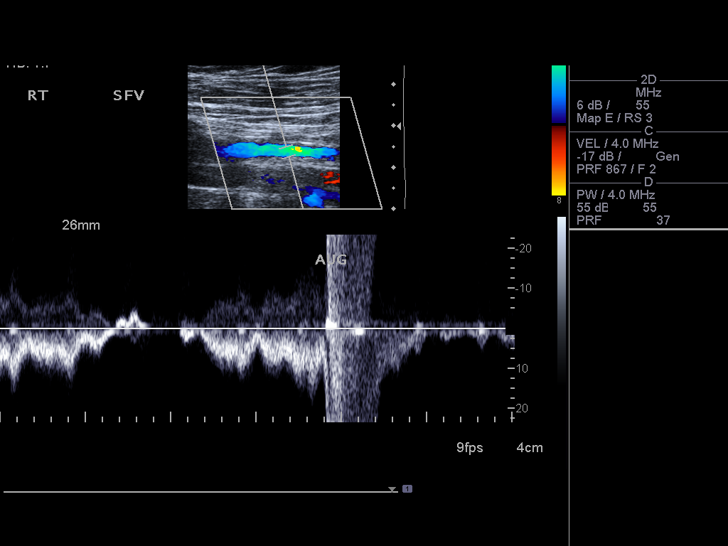
[im 18/35]
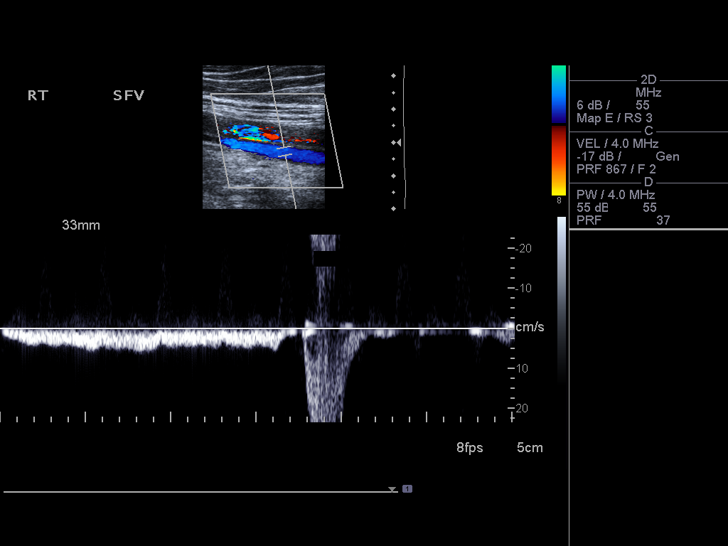
[im 21/35]
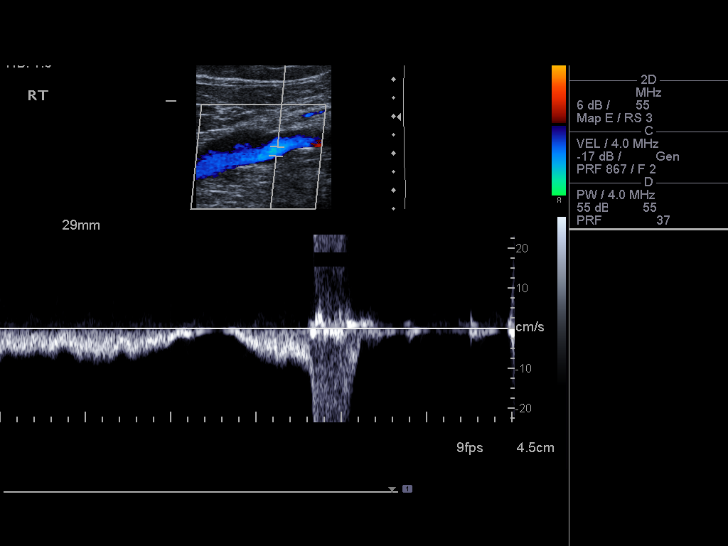
[im 24/35]
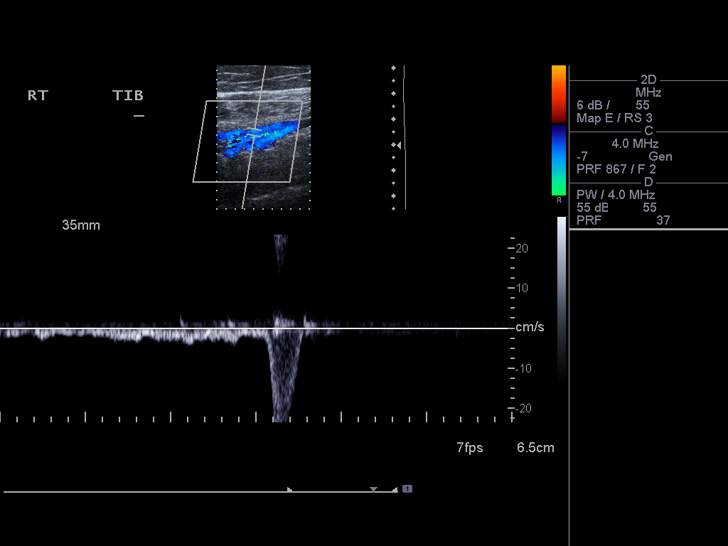
[im 27/35]
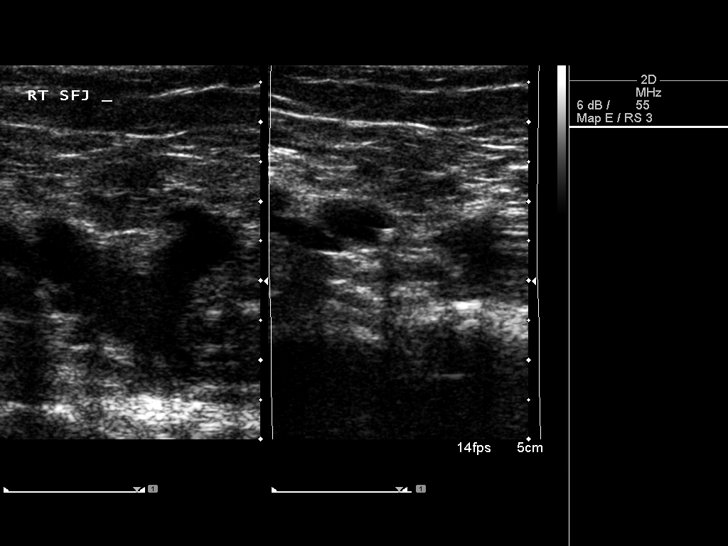
[im 29/35]
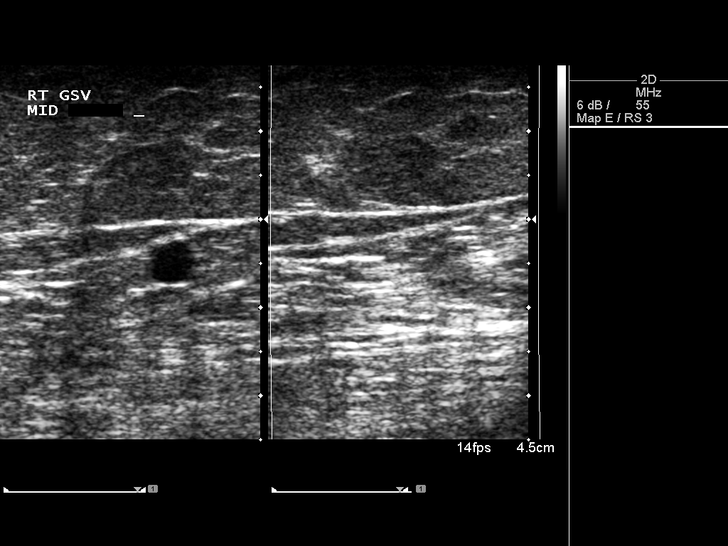
[im 32/35]
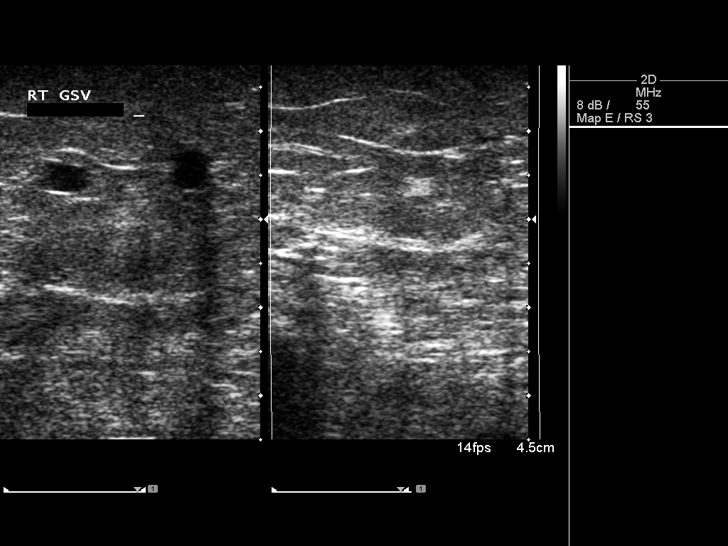
[im 35/35]
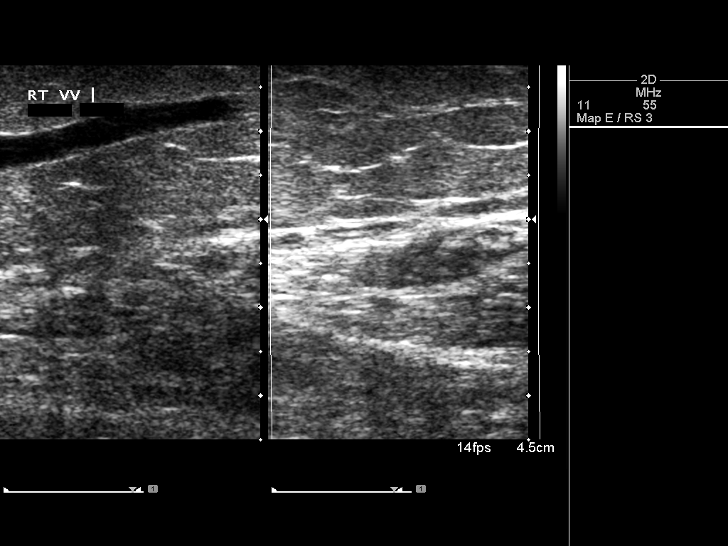

[14 of 24 positions shown; findings below may reference images not displayed]

FINDINGS: Normal compressibility of the common femoral, superficial femoral,
and popliteal veins, as well as the proximal calf veins. No filling
defects to suggest DVT on grayscale or color Doppler imaging.
Doppler waveforms show normal direction of venous flow, normal
respiratory phasicity and response to augmentation. Visualized
segments of the saphenous venous system normal in caliber and
compressibility. Varicose veins noted in the proximal calf,
compressible. Survey views of the contralateral common femoral vein
are unremarkable.
IMPRESSION: 1. No evidence of lower extremity deep vein thrombosis, RIGHT.

## 2017-08-30 IMAGING — CR DG KNEE COMPLETE 4+V*L*
1 series · 1 of 1 positions shown · non-contrast
Comparison: No prior.

CLINICAL DATA: Bilateral knee pain.

EXAM:
LEFT KNEE - COMPLETE 4+ VIEW

[view not recorded]
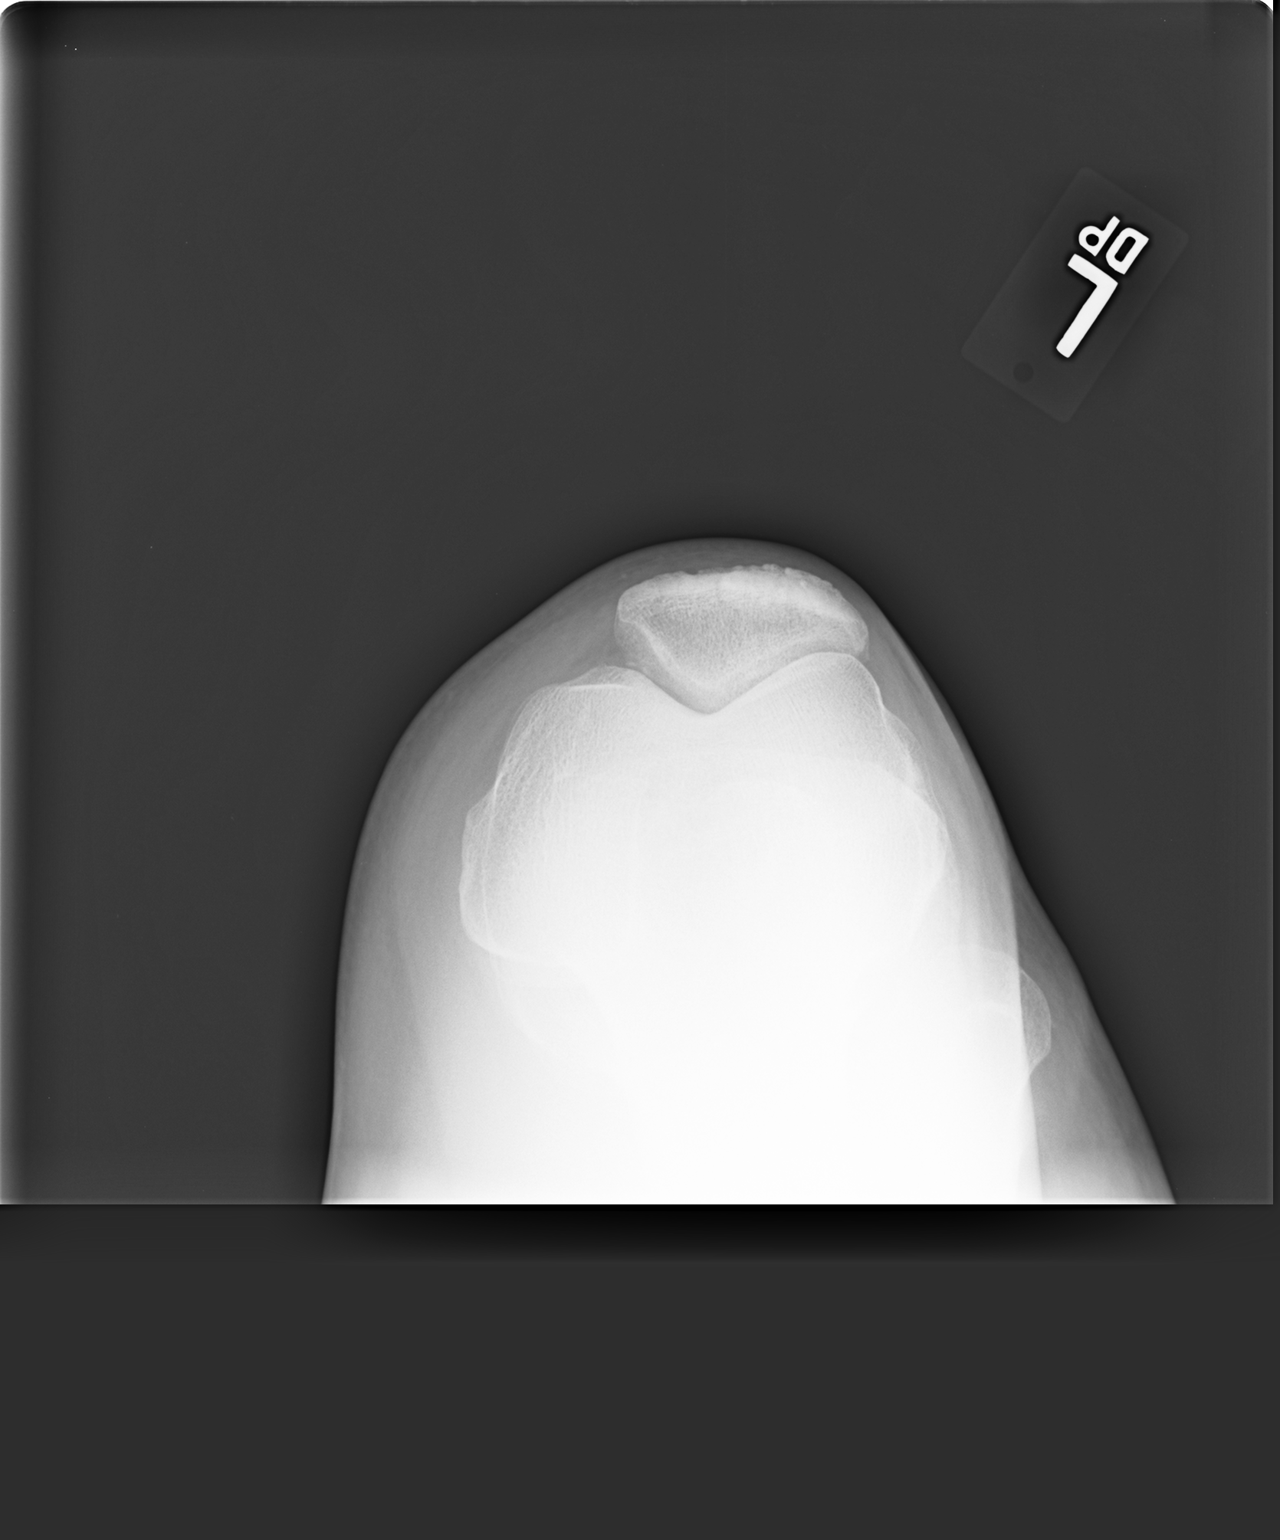

[1 of 1 positions shown; findings below may reference images not displayed]

FINDINGS: No acute bony or joint abnormality identified. No evidence of
fracture or dislocation. Small loose bodies cannot be excluded.
IMPRESSION: 1.  No acute abnormality identified.

2. Small loose bodies cannot be excluded.

## 2017-11-18 ENCOUNTER — Emergency Department (HOSPITAL_COMMUNITY)
Admission: EM | Admit: 2017-11-18 | Discharge: 2017-11-18 | Disposition: A | Discharge status: Home / Self Care | Payer: Medicaid Other | Attending: Emergency Medicine | Admitting: Emergency Medicine

## 2017-11-18 ENCOUNTER — Encounter (HOSPITAL_COMMUNITY): Payer: Self-pay | Admitting: Emergency Medicine

## 2017-11-18 ENCOUNTER — Emergency Department (HOSPITAL_COMMUNITY): Payer: Medicaid Other

## 2017-11-18 DIAGNOSIS — J45901 Unspecified asthma with (acute) exacerbation: Secondary | ICD-10-CM | POA: Insufficient documentation

## 2017-11-18 DIAGNOSIS — R1010 Upper abdominal pain, unspecified: Secondary | ICD-10-CM

## 2017-11-18 DIAGNOSIS — R109 Unspecified abdominal pain: Secondary | ICD-10-CM | POA: Diagnosis not present

## 2017-11-18 DIAGNOSIS — R197 Diarrhea, unspecified: Secondary | ICD-10-CM | POA: Insufficient documentation

## 2017-11-18 DIAGNOSIS — Z7982 Long term (current) use of aspirin: Secondary | ICD-10-CM | POA: Insufficient documentation

## 2017-11-18 DIAGNOSIS — I10 Essential (primary) hypertension: Secondary | ICD-10-CM | POA: Insufficient documentation

## 2017-11-18 DIAGNOSIS — J302 Other seasonal allergic rhinitis: Secondary | ICD-10-CM | POA: Insufficient documentation

## 2017-11-18 DIAGNOSIS — Z79899 Other long term (current) drug therapy: Secondary | ICD-10-CM | POA: Insufficient documentation

## 2017-11-18 DIAGNOSIS — F17210 Nicotine dependence, cigarettes, uncomplicated: Secondary | ICD-10-CM | POA: Insufficient documentation

## 2017-11-18 DIAGNOSIS — R1032 Left lower quadrant pain: Secondary | ICD-10-CM

## 2017-11-18 LAB — COMPREHENSIVE METABOLIC PANEL
ALBUMIN: 4.4 g/dL (ref 3.5–5.0)
ALT: 16 U/L (ref 14–54)
AST: 19 U/L (ref 15–41)
Alkaline Phosphatase: 67 U/L (ref 38–126)
Anion gap: 7 (ref 5–15)
BUN: 15 mg/dL (ref 6–20)
CO2: 25 mmol/L (ref 22–32)
CREATININE: 0.83 mg/dL (ref 0.44–1.00)
Calcium: 9.3 mg/dL (ref 8.9–10.3)
Chloride: 107 mmol/L (ref 101–111)
GFR calc Af Amer: >60 mL/min (ref >60)
GFR calc non Af Amer: >60 mL/min (ref >60)
GLUCOSE: 99 mg/dL (ref 65–99)
Potassium: 3.9 mmol/L (ref 3.5–5.1)
SODIUM: 139 mmol/L (ref 135–145)
Total Bilirubin: 0.8 mg/dL (ref 0.3–1.2)
Total Protein: 7.2 g/dL (ref 6.5–8.1)

## 2017-11-18 LAB — CBC
HCT: 42.6 % (ref 36.0–46.0)
HEMOGLOBIN: 14.4 g/dL (ref 12.0–15.0)
MCH: 31 pg (ref 26.0–34.0)
MCHC: 33.8 g/dL (ref 30.0–36.0)
MCV: 91.6 fL (ref 78.0–100.0)
Platelets: 236 10*3/uL (ref 150–400)
RBC: 4.65 MIL/uL (ref 3.87–5.11)
RDW: 12.5 % (ref 11.5–15.5)
WBC: 5.7 10*3/uL (ref 4.0–10.5)

## 2017-11-18 LAB — URINALYSIS, ROUTINE W REFLEX MICROSCOPIC
BILIRUBIN URINE: NEGATIVE
GLUCOSE, UA: NEGATIVE mg/dL
HGB URINE DIPSTICK: NEGATIVE
Ketones, ur: NEGATIVE mg/dL
Leukocytes, UA: NEGATIVE
Nitrite: NEGATIVE
Protein, ur: NEGATIVE mg/dL
SPECIFIC GRAVITY, URINE: 1.029 (ref 1.005–1.030)
pH: 5 (ref 5.0–8.0)

## 2017-11-18 LAB — I-STAT BETA HCG BLOOD, ED (MC, WL, AP ONLY): HCG, QUANTITATIVE: 11.1 m[IU]/mL — AB (ref <5)

## 2017-11-18 LAB — LIPASE, BLOOD: Lipase: 36 U/L (ref 11–51)

## 2017-11-18 MED ORDER — GI COCKTAIL ~~LOC~~
30.0000 mL | Freq: Once | ORAL | Status: AC
  Administered 2017-11-18: 30 mL via ORAL
  Filled 2017-11-18: qty 30

## 2017-11-18 MED ORDER — SODIUM CHLORIDE 0.9 % IV BOLUS (SEPSIS)
1000.0000 mL | Freq: Once | INTRAVENOUS | Status: AC
  Administered 2017-11-18: 1000 mL via INTRAVENOUS

## 2017-11-18 MED ORDER — IOPAMIDOL (ISOVUE-300) INJECTION 61%
100.0000 mL | Freq: Once | INTRAVENOUS | Status: AC | PRN
  Administered 2017-11-18: 100 mL via INTRAVENOUS

## 2017-11-18 MED ORDER — SODIUM CHLORIDE 0.9 % IJ SOLN
INTRAMUSCULAR | Status: AC
  Filled 2017-11-18: qty 50

## 2017-11-18 MED ORDER — MORPHINE SULFATE (PF) 4 MG/ML IV SOLN
4.0000 mg | Freq: Once | INTRAVENOUS | Status: AC
  Administered 2017-11-18: 4 mg via INTRAVENOUS
  Filled 2017-11-18: qty 1

## 2017-11-18 MED ORDER — RANITIDINE HCL 150 MG PO CAPS: 150.0000 mg | ORAL_CAPSULE | Freq: Every day | ORAL | 0 refills | Status: DC

## 2017-11-18 MED ORDER — IOPAMIDOL (ISOVUE-300) INJECTION 61%
INTRAVENOUS | Status: AC
  Filled 2017-11-18: qty 100

## 2017-11-18 NOTE — ED Triage Notes (Signed)
Patient states that she has been dealing with pancreatic pain for about month. Patient reports abd pain, nausea, and either having "diarrhea or not going at all". Patient reports that she researched and we have a really good pancreatic doctor that she would like to see.

## 2017-11-18 NOTE — ED Provider Notes (Signed)
[facility] DEPT Provider Note  CSN: [id number] Arrival date & time: 11/18/17 0535  Chief Complaint(s) Diarrhea and Abdominal Pain  HPI Lori Owens is a 57 y.o. female   The history is provided by the patient.  Abdominal Pain   This is a chronic problem. Episode onset: worsening over 3 weeks. Episode frequency: intermittent. Progression since onset: fluctuating. The pain is associated with an unknown factor. The pain is located in the epigastric region, LUQ and LLQ. The quality of the pain is aching, dull and colicky. The pain is moderate. Associated symptoms include anorexia, diarrhea (oily. 1 month) and nausea. Pertinent negatives include fever, hematochezia, melena, vomiting, constipation and dysuria. The symptoms are aggravated by eating. Nothing relieves the symptoms.    Past Medical History Past Medical History:  Diagnosis Date  . Asthma   . Depression 01/18/2013  . Headache(784.0)   . Heart murmur   . Hypertension   . Melanoma ([place])   . Seasonal allergies   . Tobacco abuse 01/18/2013   Patient Active Problem List   Diagnosis Date Noted  . Anxiety 04/24/2016  . Exertional chest pain   . 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 Tobacco use disorder 05/07/2015  . Obesity 05/03/2015  . Excessive daytime sleepiness 03/03/2015  . Heart palpitations 12/24/2014  . Snoring 12/23/2014  . Witnessed apneic spells 12/23/2014  . Cardiomyopathy ([place]) 09/19/2014  . Mild persistent asthma 09/03/2014  . Asthma exacerbation 09/02/2014  . Suspicious mole 04/19/2013  . SOB (shortness of breath) 04/19/2013  . Hypokalemia 01/18/2013  . Dehydration 01/18/2013  . Tachycardia 01/18/2013   Home Medication(s) Prior to Admission medications   Medication Sig Start Date End Date Taking? Authorizing Provider  albuterol (PROVENTIL HFA;VENTOLIN HFA) 108 (90 BASE) MCG/ACT inhaler Inhale 2 puffs into the lungs every 6  (six) hours as needed for wheezing or shortness of breath. 09/05/14  Yes TatShanon Brow, MD  aspirin 81 MG tablet Take 81 mg by mouth daily.    Yes [provider]  busPIRone (BUSPAR) 10 MG tablet Take 10 mg by mouth 2 (two) times daily.   Yes [provider]  Cholecalciferol 5000 units capsule Take 5,000 Units by mouth daily.  05/30/15  Yes [provider]  clonazePAM (KLONOPIN) 0.5 MG tablet Take 0.5 mg by mouth 2 (two) times daily.   Yes [provider]  Glycopyrrolate-Formoterol (BEVESPI AEROSPHERE) 9-4.8 MCG/ACT AERO Inhale 1 puff into the lungs 2 (two) times daily.   Yes [provider]  nicotine (NICODERM CQ - DOSED IN MG/24 HOURS) 14 mg/24hr patch Place 1 patch (14 mg total) onto the skin daily. 04/25/16  Yes Debbe Odea, MD  predniSONE (DELTASONE) 20 MG tablet Take 2 tablets (40 mg total) by mouth daily. 04/24/16  Yes Sam, Serena Y, PA-C  SINGULAIR 10 MG tablet Take 1 tablet (10 mg total) by mouth at bedtime. Patient taking differently: Take 10 mg by mouth daily.  05/12/15  Yes Niel Hummer, NP  traZODone (DESYREL) 150 MG tablet Take 150 mg by mouth at bedtime.   Yes [provider]  albuterol (PROVENTIL) (2.5 MG/3ML) 0.083% nebulizer solution Take 2.5 mg by nebulization every 6 (six) hours as needed for wheezing or shortness of breath.    [provider]  ALPRAZolam Duanne Moron) 0.5 MG tablet Take 1 mg by mouth at bedtime.  04/15/15   [provider]  cyclobenzaprine (FLEXERIL) 10 MG tablet Take 1 tablet (10 mg total) by mouth  2 (two) times daily as needed for muscle spasms. Patient not taking: Reported on 11/18/2017 08/09/16   Gloriann Loan, PA-C  dextromethorphan-guaiFENesin [facility] DM) 30-600 MG 12hr tablet Take 1 tablet by mouth 2 (two) times daily. Patient not taking: Reported on 11/18/2017 04/25/16   Debbe Odea, MD  Fluticasone-Salmeterol (ADVAIR) 250-50 MCG/DOSE AEPB Inhale 1 puff into the lungs 2 (two) times daily.     [provider]  ibuprofen (ADVIL,MOTRIN) 600 MG tablet Take 1 tablet (600 mg total) by mouth every 6 (six) hours as needed. Patient not taking: Reported on 11/18/2017 08/09/16   Gloriann Loan, PA-C  ranitidine (ZANTAC) 150 MG capsule Take 1 capsule (150 mg total) by mouth daily. 11/18/17   Fatima Blank, MD                                                                                                                                    Past Surgical History Past Surgical History:  Procedure Laterality Date  . ABDOMINAL HYSTERECTOMY    . NOSE SURGERY     polyp removal from nasal cavity   . THROAT SURGERY    . TONSILLECTOMY     Family History Family History  Problem Relation Age of Onset  . Diabetes Maternal Grandfather   . Heart disease Maternal Grandfather   . Cancer Maternal Grandfather   . Hypertension Maternal Grandfather     Social History Social History   Tobacco Use  . Smoking status: Current Every Day Smoker    Packs/day: 0.75    Years: 24.00    Pack years: 18.00    Types: Cigarettes  . Smokeless tobacco: Never Used  Substance Use Topics  . Alcohol use: No    Alcohol/week: 0.0 oz  . Drug use: No   Allergies Escitalopram oxalate  Review of Systems Review of Systems  Constitutional: Negative for fever.  Gastrointestinal: Positive for abdominal pain, anorexia, diarrhea (oily. 1 month) and nausea. Negative for constipation, hematochezia, melena and vomiting.  Genitourinary: Negative for dysuria.   All other systems are reviewed and are negative for acute change except as noted in the HPI  Physical Exam Vital Signs  I have reviewed the triage vital signs BP (!) 144/98   Pulse 72   Temp (!) 97.5 F (36.4 C) (Oral)   Resp 16   SpO2 98%   Physical Exam  Constitutional: She is oriented to person, place, and time. She appears well-developed and well-nourished. No distress.  HENT:  Head: Normocephalic and atraumatic.  Nose: Nose normal.  Eyes:  Conjunctivae and EOM are normal. Pupils are equal, round, and reactive to light. Right eye exhibits no discharge. Left eye exhibits no discharge. No scleral icterus.  Neck: Normal range of motion. Neck supple.  Cardiovascular: Normal rate and regular rhythm. Exam reveals no gallop and no friction rub.  No murmur heard. Pulmonary/Chest: Effort normal and breath sounds normal. No stridor. No respiratory distress.  She has no rales.  Abdominal: Soft. She exhibits no distension. There is tenderness in the epigastric area, left upper quadrant and left lower quadrant. There is no rigidity, no rebound and no guarding.  Musculoskeletal: She exhibits no edema or tenderness.  Neurological: She is alert and oriented to person, place, and time.  Skin: Skin is warm and dry. No rash noted. She is not diaphoretic. No erythema.  Psychiatric: She has a normal mood and affect.  Vitals reviewed.   ED Results and Treatments Labs (all labs ordered are listed, but only abnormal results are displayed) Labs Reviewed  I-STAT BETA HCG BLOOD, ED ([facility], [facility], [facility] ONLY) - Abnormal; Notable for the following components:      Result Value   I-stat hCG, quantitative 11.1 (*)    All other components within normal limits  LIPASE, BLOOD  COMPREHENSIVE METABOLIC PANEL  CBC  URINALYSIS, ROUTINE W REFLEX MICROSCOPIC                                                                                                                         EKG  EKG Interpretation  Date/Time:    Ventricular Rate:    PR Interval:    QRS Duration:   QT Interval:    QTC Calculation:   R Axis:     Text Interpretation:        Radiology Ct Abdomen Pelvis W Contrast  Result Date: 11/18/2017 CLINICAL DATA:  Abdominal pain, nausea EXAM: CT ABDOMEN AND PELVIS WITH CONTRAST TECHNIQUE: Multidetector CT imaging of the abdomen and pelvis was performed using the standard protocol following bolus administration of intravenous contrast. CONTRAST:  147mL  ISOVUE-300 IOPAMIDOL (ISOVUE-300) INJECTION 61% COMPARISON:  08/27/2013 FINDINGS: Lower chest: Lung bases are clear. No effusions. Heart is normal size. Hepatobiliary: No focal hepatic abnormality. Gallbladder unremarkable. Pancreas: No focal abnormality or ductal dilatation. Spleen: No focal abnormality.  Normal size. Adrenals/Urinary Tract: No adrenal abnormality. No focal renal abnormality. No stones or hydronephrosis. Urinary bladder is unremarkable. Stomach/Bowel: Few scattered colonic diverticula. No active diverticulitis. Appendix is normal. Stomach and small bowel grossly unremarkable. No evidence of bowel obstruction. Vascular/Lymphatic: No evidence of aneurysm or adenopathy. Reproductive: Prior hysterectomy.  No adnexal masses. Other: No free fluid or free air. Musculoskeletal: No acute bony abnormality. Degenerative changes in the lower lumbar spine. IMPRESSION: No acute findings in the abdomen or pelvis. Electronically Signed   By: Rolm Baptise M.D.   On: 11/18/2017 11:18   Pertinent labs & imaging results that were available during my care of the patient were reviewed by me and considered in my medical decision making (see chart for details).  Medications Ordered in ED Medications  iopamidol (ISOVUE-300) 61 % injection (not administered)  sodium chloride 0.9 % injection (not administered)  sodium chloride 0.9 % bolus 1,000 mL (0 mLs Intravenous Stopped 11/18/17 1206)  morphine 4 MG/ML injection 4 mg (4 mg Intravenous Given 11/18/17 1027)  iopamidol (ISOVUE-300) 61 % injection 100 mL (100 mLs Intravenous  Contrast Given 11/18/17 1056)  gi cocktail (Maalox,Lidocaine,Donnatal) (30 mLs Oral Given 11/18/17 1203)                                                                                                                                    Procedures Procedures  (including critical care time)  Medical Decision Making / ED Course I have reviewed the nursing notes for this encounter and the  patient's prior records (if available in EHR or on provided paperwork).    Reassuring workup with negative labs and imaging.  Patient provided with GI cocktail.  Able to tolerate oral hydration.  The patient appears reasonably screened and/or stabilized for discharge and I doubt any other medical condition or other [facility] requiring further screening, evaluation, or treatment in the ED at this time prior to discharge.  The patient is safe for discharge with strict return precautions.   Final Clinical Impression(s) / ED Diagnoses Final diagnoses:  Pain of upper abdomen  LLQ abdominal pain  Diarrhea, unspecified type   Disposition: Discharge  Condition: Good  I have discussed the results, Dx and Tx plan with the patient who expressed understanding and agree(s) with the plan. Discharge instructions discussed at great length. The patient was given strict return precautions who verbalized understanding of the instructions. No further questions at time of discharge.    ED Discharge Orders        Ordered    ranitidine (ZANTAC) 150 MG capsule  Daily     11/18/17 1225       Follow Up: Fanny Bien, MD [address] [place] [address] [phone]  Schedule an appointment as soon as possible for a visit  As needed      This chart was dictated using voice recognition software.  Despite best efforts to proofread,  errors can occur which can change the documentation meaning.   Fatima Blank, MD 11/18/17 1329

## 2017-12-19 ENCOUNTER — Encounter (HOSPITAL_COMMUNITY): Payer: Self-pay | Admitting: Emergency Medicine

## 2017-12-19 ENCOUNTER — Emergency Department (HOSPITAL_COMMUNITY): Payer: Medicaid Other

## 2017-12-19 ENCOUNTER — Emergency Department (HOSPITAL_COMMUNITY)
Admission: EM | Admit: 2017-12-19 | Discharge: 2017-12-19 | Disposition: A | Discharge status: Home / Self Care | Payer: Medicaid Other | Attending: Emergency Medicine | Admitting: Emergency Medicine

## 2017-12-19 DIAGNOSIS — J441 Chronic obstructive pulmonary disease with (acute) exacerbation: Secondary | ICD-10-CM | POA: Diagnosis not present

## 2017-12-19 DIAGNOSIS — I10 Essential (primary) hypertension: Secondary | ICD-10-CM | POA: Insufficient documentation

## 2017-12-19 DIAGNOSIS — T7840XA Allergy, unspecified, initial encounter: Secondary | ICD-10-CM | POA: Insufficient documentation

## 2017-12-19 DIAGNOSIS — J45909 Unspecified asthma, uncomplicated: Secondary | ICD-10-CM | POA: Insufficient documentation

## 2017-12-19 DIAGNOSIS — R062 Wheezing: Secondary | ICD-10-CM | POA: Diagnosis present

## 2017-12-19 DIAGNOSIS — F17210 Nicotine dependence, cigarettes, uncomplicated: Secondary | ICD-10-CM | POA: Insufficient documentation

## 2017-12-19 DIAGNOSIS — J40 Bronchitis, not specified as acute or chronic: Secondary | ICD-10-CM

## 2017-12-19 MED ORDER — IPRATROPIUM BROMIDE 0.02 % IN SOLN
0.5000 mg | Freq: Once | RESPIRATORY_TRACT | Status: AC
  Administered 2017-12-19: 0.5 mg via RESPIRATORY_TRACT
  Filled 2017-12-19: qty 2.5

## 2017-12-19 MED ORDER — PREDNISONE 20 MG PO TABS
60.0000 mg | ORAL_TABLET | Freq: Once | ORAL | Status: AC
  Administered 2017-12-19: 60 mg via ORAL
  Filled 2017-12-19: qty 3

## 2017-12-19 MED ORDER — ALBUTEROL SULFATE (2.5 MG/3ML) 0.083% IN NEBU
5.0000 mg | INHALATION_SOLUTION | Freq: Once | RESPIRATORY_TRACT | Status: AC
  Administered 2017-12-19: 5 mg via RESPIRATORY_TRACT
  Filled 2017-12-19: qty 6

## 2017-12-19 MED ORDER — FLUTICASONE PROPIONATE 50 MCG/ACT NA SUSP
2.0000 | Freq: Every day | NASAL | Status: DC
  Administered 2017-12-19: 2 via NASAL
  Filled 2017-12-19: qty 16

## 2017-12-19 MED ORDER — AZITHROMYCIN 250 MG PO TABS: ORAL_TABLET | ORAL | 0 refills | Status: DC

## 2017-12-19 MED ORDER — PREDNISONE 10 MG PO TABS: 40.0000 mg | ORAL_TABLET | Freq: Every day | ORAL | 0 refills | Status: AC

## 2017-12-19 MED ORDER — ALBUTEROL (5 MG/ML) CONTINUOUS INHALATION SOLN
10.0000 mg/h | INHALATION_SOLUTION | RESPIRATORY_TRACT | Status: DC
  Administered 2017-12-19: 10 mg/h via RESPIRATORY_TRACT
  Filled 2017-12-19: qty 20

## 2017-12-19 NOTE — ED Provider Notes (Signed)
[facility] DEPT Provider Note  CSN: [id number] Arrival date & time: 12/19/17 8938  Chief Complaint(s) Wheezing; Shortness of Breath; and Nasal Congestion  HPI Lori Owens is a 57 y.o. female   The history is provided by the patient.  Wheezing   This is a recurrent problem. Episode onset: 1 week. The problem occurs daily. Progression since onset: fluctuating. Associated symptoms include rhinorrhea and cough. Pertinent negatives include no chest pain, no fever, no abdominal pain, no vomiting, no coryza and no sputum production. She has tried beta-agonist inhalers (benadryl and Allegra) for the symptoms. The treatment provided mild relief. Her past medical history is significant for asthma.  Shortness of Breath  This is a recurrent problem. The average episode lasts 1 week. The problem occurs intermittently.Associated symptoms include rhinorrhea, cough and wheezing. Pertinent negatives include no fever, no coryza, no sputum production, no chest pain, no vomiting and no abdominal pain. She has tried beta-agonist inhalers for the symptoms. The treatment provided mild relief. Associated medical issues include asthma.   Patient reports that she is allergic to cats and has been staying with family that have cats in the house.  Since she has had increased shortness of breath and wheezing.   Past Medical History Past Medical History:  Diagnosis Date  . Asthma   . Depression 01/18/2013  . Headache(784.0)   . Heart murmur   . Hypertension   . Melanoma ([place])   . Seasonal allergies   . Tobacco abuse 01/18/2013   Patient Active Problem List   Diagnosis Date Noted  . Anxiety 04/24/2016  . Exertional chest pain   . MDD (major depressive disorder), recurrent severe, without psychosis ([place]) 05/07/2015  . Tobacco use disorder 05/07/2015  . Obesity 05/03/2015  . Excessive daytime sleepiness 03/03/2015  . Heart palpitations 12/24/2014  . Snoring 12/23/2014  .  Witnessed apneic spells 12/23/2014  . Cardiomyopathy ([place]) 09/19/2014  . Mild persistent asthma 09/03/2014  . Asthma exacerbation 09/02/2014  . Suspicious mole 04/19/2013  . SOB (shortness of breath) 04/19/2013  . Hypokalemia 01/18/2013  . Dehydration 01/18/2013  . Tachycardia 01/18/2013   Home Medication(s) Prior to Admission medications   Medication Sig Start Date End Date Taking? Authorizing Provider  Biotin w/ Vitamins C & E (HAIR SKIN & NAILS GUMMIES) 1250-7.5-7.5 MCG-MG-UNT CHEW Chew 1 tablet by mouth daily.   Yes [provider]  Cholecalciferol 5000 units capsule Take 5,000 Units by mouth daily.  05/30/15  Yes [provider]  clonazePAM (KLONOPIN) 0.5 MG tablet Take 0.5 mg by mouth 2 (two) times daily.   Yes [provider]  loratadine (CLARITIN) 10 MG tablet Take 10 mg by mouth daily.   Yes [provider]  Melatonin 10 MG TABS Take 15 mg by mouth at bedtime as needed (sleep).   Yes [provider]  traZODone (DESYREL) 150 MG tablet Take 150 mg by mouth at bedtime.   Yes [provider]  albuterol (PROVENTIL HFA;VENTOLIN HFA) 108 (90 BASE) MCG/ACT inhaler Inhale 2 puffs into the lungs every 6 (six) hours as needed for wheezing or shortness of breath. Patient not taking: Reported on 12/19/2017 09/05/14   Orson Eva, MD  azithromycin (ZITHROMAX Z-PAK) 250 MG tablet Take 500 mg on day 1, then 250 mg once a day for days 2, 3, 4, and 5. 12/19/17   Shakya Sebring, Grayce Sessions, MD  predniSONE (DELTASONE) 10 MG tablet Take 4 tablets (40 mg total) by mouth daily for 4 days. 12/19/17 12/23/17  Lizette Pazos,  Grayce Sessions, MD  ranitidine (ZANTAC) 150 MG capsule Take 1 capsule (150 mg total) by mouth daily. Patient not taking: Reported on 12/19/2017 11/18/17   Fatima Blank, MD  SINGULAIR 10 MG tablet Take 1 tablet (10 mg total) by mouth at bedtime. Patient not taking: Reported on 12/19/2017 05/12/15   Niel Hummer, NP                                                                                                                                     Past Surgical History Past Surgical History:  Procedure Laterality Date  . ABDOMINAL HYSTERECTOMY    . NOSE SURGERY     polyp removal from nasal cavity   . THROAT SURGERY    . TONSILLECTOMY     Family History Family History  Problem Relation Age of Onset  . Diabetes Maternal Grandfather   . Heart disease Maternal Grandfather   . Cancer Maternal Grandfather   . Hypertension Maternal Grandfather     Social History Social History   Tobacco Use  . Smoking status: Current Every Day Smoker    Packs/day: 0.75    Years: 24.00    Pack years: 18.00    Types: Cigarettes  . Smokeless tobacco: Never Used  Substance Use Topics  . Alcohol use: No    Alcohol/week: 0.0 oz  . Drug use: No   Allergies Dust mite extract; Escitalopram oxalate; and Other  Review of Systems Review of Systems  Constitutional: Negative for fever.  HENT: Positive for rhinorrhea.   Respiratory: Positive for cough, shortness of breath and wheezing. Negative for sputum production.   Cardiovascular: Negative for chest pain.  Gastrointestinal: Negative for abdominal pain and vomiting.   All other systems are reviewed and are negative for acute change except as noted in the HPI  Physical Exam Vital Signs  I have reviewed the triage vital signs BP 131/85 (BP Location: Right Arm)   Pulse 93   Temp 98.3 F (36.8 C) (Oral)   Resp 18   SpO2 94%   Physical Exam  Constitutional: She is oriented to person, place, and time. She appears well-developed and well-nourished. No distress.  HENT:  Head: Normocephalic and atraumatic.  Nose: Mucosal edema and rhinorrhea present.  Eyes: Conjunctivae and EOM are normal. Pupils are equal, round, and reactive to light. Right eye exhibits no discharge. Left eye exhibits no discharge. No scleral icterus.  Neck: Normal range of motion. Neck supple.  Cardiovascular: Normal  rate and regular rhythm. Exam reveals no gallop and no friction rub.  No murmur heard. Pulmonary/Chest: Effort normal. No stridor. No tachypnea. No respiratory distress. She has wheezes. She has rhonchi. She has no rales.  Abdominal: Soft. She exhibits no distension. There is no tenderness.  Musculoskeletal: She exhibits no edema or tenderness.  Neurological: She is alert and oriented to person, place, and time.  Skin: Skin is warm and dry. No  rash noted. She is not diaphoretic. No erythema.  Psychiatric: She has a normal mood and affect.  Vitals reviewed.   ED Results and Treatments Labs (all labs ordered are listed, but only abnormal results are displayed) Labs Reviewed - No data to display                                                                                                                       EKG  EKG Interpretation  Date/Time:    Ventricular Rate:    PR Interval:    QRS Duration:   QT Interval:    QTC Calculation:   R Axis:     Text Interpretation:        Radiology Dg Chest 2 View  Result Date: 12/19/2017 CLINICAL DATA:  Wheezing.  Shortness of breath.  Congestion. EXAM: CHEST  2 VIEW COMPARISON:  07/06/2016 FINDINGS: Midline trachea. Normal heart size and mediastinal contours. Atherosclerosis in the transverse aorta. No pleural effusion or pneumothorax. Mild hyperinflation and interstitial thickening. No lobar consolidation. IMPRESSION: No acute cardiopulmonary disease. Hyperinflation and interstitial thickening, most consistent with COPD/chronic bronchitis. Aortic Atherosclerosis (ICD10-I70.0). Electronically Signed   By: Abigail Miyamoto M.D.   On: 12/19/2017 11:07   Pertinent labs & imaging results that were available during my care of the patient were reviewed by me and considered in my medical decision making (see chart for details).  Medications Ordered in ED Medications  albuterol (PROVENTIL,VENTOLIN) solution continuous neb (0 mg/hr Nebulization Stopped  12/19/17 1414)  fluticasone (FLONASE) 50 MCG/ACT nasal spray 2 spray (2 sprays Each Nare Given 12/19/17 1416)  albuterol (PROVENTIL) (2.5 MG/3ML) 0.083% nebulizer solution 5 mg (5 mg Nebulization Given 12/19/17 1019)  predniSONE (DELTASONE) tablet 60 mg (60 mg Oral Given 12/19/17 1055)  ipratropium (ATROVENT) nebulizer solution 0.5 mg (0.5 mg Nebulization Given 12/19/17 1231)                                                                                                                                    Procedures Procedures CRITICAL CARE Performed by: Grayce Sessions Joeanthony Seeling Total critical care time: 35 minutes Critical care time was exclusive of separately billable procedures and treating other patients. Critical care was necessary to treat or prevent imminent or life-threatening deterioration. Critical care was time spent personally by me on the following activities: development of treatment plan with patient and/or surrogate as well as nursing, discussions with consultants,  evaluation of patient's response to treatment, examination of patient, obtaining history from patient or surrogate, ordering and performing treatments and interventions, ordering and review of laboratory studies, ordering and review of radiographic studies, pulse oximetry and re-evaluation of patient's condition.   (including critical care time)  Medical Decision Making / ED Course I have reviewed the nursing notes for this encounter and the patient's prior records (if available in EHR or on provided paperwork).     Presentation is consistent with COPD/asthma exacerbation likely secondary to allergic reaction to cats.  Patient has tried taking over-the-counter medicine with.  Has been using her albuterol inhalers with minimal relief.  On exam patient has diffuse bronchitic sounds and wheezing.  No respiratory distress and no hypoxia.  Provided with single DuoNeb with improved but still persistent wheezing.  She was given a  continuous DuoNeb resulting in resolution of the wheezing.  She continues to have bronchitic changes.  Chest x-ray without evidence of pneumonia.  Patient was provided with oral prednisone, and flonase. Will DC with prescription for prednisone and azithro.  Counseled patient for approximately 5 minutes regarding smoking cessation. Discussed risks of smoking and how they applied and affected their visit here today. Patient is in the process of quitting at this time since our last conversation.  CPT code: [phone]: intermediate counseling for smoking cessation   The patient appears reasonably screened and/or stabilized for discharge and I doubt any other medical condition or other [facility] requiring further screening, evaluation, or treatment in the ED at this time prior to discharge.  The patient is safe for discharge with strict return precautions.  Final Clinical Impression(s) / ED Diagnoses Final diagnoses:  COPD exacerbation ([place])  Bronchitis  Allergic state, initial encounter    Disposition: Discharge  Condition: Good  I have discussed the results, Dx and Tx plan with the patient who expressed understanding and agree(s) with the plan. Discharge instructions discussed at great length. The patient was given strict return precautions who verbalized understanding of the instructions. No further questions at time of discharge.    ED Discharge Orders        Ordered    predniSONE (DELTASONE) 10 MG tablet  Daily     12/19/17 1417    azithromycin (ZITHROMAX Z-PAK) 250 MG tablet     12/19/17 1417       Follow Up: Primary care provider   If you do not have a primary care physician, contact [facility] at [phone] for referral    This chart was dictated using voice recognition software.  Despite best efforts to proofread,  errors can occur which can change the documentation meaning.   Fatima Blank, MD 12/19/17 [phone]

## 2017-12-19 NOTE — ED Triage Notes (Signed)
Pt c/o wheezing, nasal congestion and shortness of breath. Pt states she recently moved in with family that has cats, pt allergic to cats. Pt has been taking OTC meds and no relief. Pt states she also has hx of polyp in R nostril and she feels it has returned.

## 2018-10-11 ENCOUNTER — Encounter (HOSPITAL_COMMUNITY): Payer: Self-pay | Admitting: Emergency Medicine

## 2018-10-11 ENCOUNTER — Emergency Department (HOSPITAL_COMMUNITY): Payer: Medicaid Other

## 2018-10-11 ENCOUNTER — Emergency Department (HOSPITAL_COMMUNITY)
Admission: EM | Admit: 2018-10-11 | Discharge: 2018-10-11 | Disposition: A | Discharge status: Home / Self Care | Payer: Medicaid Other | Attending: Emergency Medicine | Admitting: Emergency Medicine

## 2018-10-11 DIAGNOSIS — Z79899 Other long term (current) drug therapy: Secondary | ICD-10-CM | POA: Diagnosis not present

## 2018-10-11 DIAGNOSIS — I10 Essential (primary) hypertension: Secondary | ICD-10-CM | POA: Insufficient documentation

## 2018-10-11 DIAGNOSIS — J441 Chronic obstructive pulmonary disease with (acute) exacerbation: Secondary | ICD-10-CM | POA: Diagnosis not present

## 2018-10-11 DIAGNOSIS — J45909 Unspecified asthma, uncomplicated: Secondary | ICD-10-CM | POA: Diagnosis not present

## 2018-10-11 DIAGNOSIS — R0602 Shortness of breath: Secondary | ICD-10-CM | POA: Diagnosis present

## 2018-10-11 DIAGNOSIS — Z85820 Personal history of malignant melanoma of skin: Secondary | ICD-10-CM | POA: Diagnosis not present

## 2018-10-11 DIAGNOSIS — F17210 Nicotine dependence, cigarettes, uncomplicated: Secondary | ICD-10-CM | POA: Diagnosis not present

## 2018-10-11 LAB — BASIC METABOLIC PANEL
ANION GAP: 9 (ref 5–15)
BUN: 14 mg/dL (ref 6–20)
CALCIUM: 9.5 mg/dL (ref 8.9–10.3)
CO2: 28 mmol/L (ref 22–32)
Chloride: 107 mmol/L (ref 98–111)
Creatinine, Ser: 0.76 mg/dL (ref 0.44–1.00)
Glucose, Bld: 98 mg/dL (ref 70–99)
POTASSIUM: 4 mmol/L (ref 3.5–5.1)
SODIUM: 144 mmol/L (ref 135–145)

## 2018-10-11 LAB — CBC
HCT: 44.3 % (ref 36.0–46.0)
HEMOGLOBIN: 14.3 g/dL (ref 12.0–15.0)
MCH: 31.3 pg (ref 26.0–34.0)
MCHC: 32.3 g/dL (ref 30.0–36.0)
MCV: 96.9 fL (ref 80.0–100.0)
PLATELETS: 261 10*3/uL (ref 150–400)
RBC: 4.57 MIL/uL (ref 3.87–5.11)
RDW: 12.1 % (ref 11.5–15.5)
WBC: 5.4 10*3/uL (ref 4.0–10.5)
nRBC: 0 % (ref 0.0–0.2)

## 2018-10-11 MED ORDER — PREDNISONE 20 MG PO TABS: 40.0000 mg | ORAL_TABLET | Freq: Every day | ORAL | 0 refills | Status: DC

## 2018-10-11 MED ORDER — PREDNISONE 20 MG PO TABS
60.0000 mg | ORAL_TABLET | Freq: Once | ORAL | Status: AC
  Administered 2018-10-11: 60 mg via ORAL
  Filled 2018-10-11: qty 3

## 2018-10-11 MED ORDER — ALBUTEROL (5 MG/ML) CONTINUOUS INHALATION SOLN
INHALATION_SOLUTION | RESPIRATORY_TRACT | Status: AC
  Filled 2018-10-11: qty 20

## 2018-10-11 MED ORDER — ALBUTEROL SULFATE (2.5 MG/3ML) 0.083% IN NEBU: 5.0000 mg | INHALATION_SOLUTION | Freq: Once | RESPIRATORY_TRACT | Status: DC

## 2018-10-11 MED ORDER — ALBUTEROL SULFATE (2.5 MG/3ML) 0.083% IN NEBU: 2.5000 mg | INHALATION_SOLUTION | RESPIRATORY_TRACT | 0 refills | Status: DC | PRN

## 2018-10-11 MED ORDER — IPRATROPIUM-ALBUTEROL 0.5-2.5 (3) MG/3ML IN SOLN
3.0000 mL | Freq: Once | RESPIRATORY_TRACT | Status: AC
  Administered 2018-10-11: 3 mL via RESPIRATORY_TRACT
  Filled 2018-10-11: qty 3

## 2018-10-11 MED ORDER — ALBUTEROL (5 MG/ML) CONTINUOUS INHALATION SOLN
10.0000 mg/h | INHALATION_SOLUTION | RESPIRATORY_TRACT | Status: DC
  Administered 2018-10-11: 10 mg/h via RESPIRATORY_TRACT

## 2018-10-11 NOTE — ED Triage Notes (Signed)
Pt reports she has COPD and reports her medications arent helping her anymore. :done nebulizer treatments every day:. Pt reports her cough is normal for her.

## 2018-10-11 NOTE — ED Provider Notes (Signed)
[facility] DEPT Provider Note   CSN: [id number] Arrival date & time: 10/11/18  1236     History   Chief Complaint Chief Complaint  Patient presents with  . Shortness of Breath    HPI Lori Owens is a 57 y.o. female.  HPI Patient with a history of COPD presents with dyspnea. No fever, no chest pain, no abdominal pain purulent onset was a few days ago, possibly as long as 1 week ago. She notes that she recently transitioned to this area, and has been staying with family members have cats.  When she is a known allergy to cats. She is transitioning to her new apartment today. In spite of using her home albuterol regularly, she has had worsening dyspnea. She is here with her mother who assists with the HPI. Past Medical History:  Diagnosis Date  . Asthma   . COPD (chronic obstructive pulmonary disease) ([place])   . Depression 01/18/2013  . Headache(784.0)   . Heart murmur   . Hypertension   . Melanoma ([place])   . Seasonal allergies   . Tobacco abuse 01/18/2013    Patient Active Problem List   Diagnosis Date Noted  . Anxiety 04/24/2016  . Exertional chest pain   . 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 Tobacco use disorder 05/07/2015  . Obesity 05/03/2015  . Excessive daytime sleepiness 03/03/2015  . Heart palpitations 12/24/2014  . Snoring 12/23/2014  . Witnessed apneic spells 12/23/2014  . Cardiomyopathy ([place]) 09/19/2014  . Mild persistent asthma 09/03/2014  . Asthma exacerbation 09/02/2014  . Suspicious mole 04/19/2013  . SOB (shortness of breath) 04/19/2013  . Hypokalemia 01/18/2013  . Dehydration 01/18/2013  . Tachycardia 01/18/2013    Past Surgical History:  Procedure Laterality Date  . ABDOMINAL HYSTERECTOMY    . NOSE SURGERY     polyp removal from nasal cavity   . THROAT SURGERY    . TONSILLECTOMY       OB History   None      Home Medications    Prior to Admission  medications   Medication Sig Start Date End Date Taking? Authorizing Provider  albuterol (PROVENTIL HFA;VENTOLIN HFA) 108 (90 BASE) MCG/ACT inhaler Inhale 2 puffs into the lungs every 6 (six) hours as needed for wheezing or shortness of breath. Patient not taking: Reported on 12/19/2017 09/05/14   Orson Eva, MD  azithromycin (ZITHROMAX Z-PAK) 250 MG tablet Take 500 mg on day 1, then 250 mg once a day for days 2, 3, 4, and 5. 12/19/17   Cardama, Grayce Sessions, MD  Biotin w/ Vitamins C & E (HAIR SKIN & NAILS GUMMIES) 1250-7.5-7.5 MCG-MG-UNT CHEW Chew 1 tablet by mouth daily.    [provider]  Cholecalciferol 5000 units capsule Take 5,000 Units by mouth daily.  05/30/15   [provider]  clonazePAM (KLONOPIN) 0.5 MG tablet Take 0.5 mg by mouth 2 (two) times daily.    [provider]  loratadine (CLARITIN) 10 MG tablet Take 10 mg by mouth daily.    [provider]  Melatonin 10 MG TABS Take 15 mg by mouth at bedtime as needed (sleep).    [provider]  ranitidine (ZANTAC) 150 MG capsule Take 1 capsule (150 mg total) by mouth daily. Patient not taking: Reported on 12/19/2017 11/18/17   Fatima Blank, MD  SINGULAIR 10 MG tablet Take 1 tablet (10 mg total) by mouth at bedtime. Patient not taking: Reported on 12/19/2017  05/12/15   Niel Hummer, NP  traZODone (DESYREL) 150 MG tablet Take 150 mg by mouth at bedtime.    [provider]    Family History Family History  Problem Relation Age of Onset  . Diabetes Maternal Grandfather   . Heart disease Maternal Grandfather   . Cancer Maternal Grandfather   . Hypertension Maternal Grandfather     Social History Social History   Tobacco Use  . Smoking status: Current Every Day Smoker    Packs/day: 0.75    Years: 24.00    Pack years: 18.00    Types: Cigarettes  . Smokeless tobacco: Never Used  Substance Use Topics  . Alcohol use: No    Alcohol/week: 0.0 standard drinks  . Drug use:  No     Allergies   Dust mite extract; Escitalopram oxalate; and Other   Review of Systems Review of Systems  Constitutional:       Per HPI, otherwise negative  HENT:       Per HPI, otherwise negative  Respiratory:       Per HPI, otherwise negative  Cardiovascular:       Per HPI, otherwise negative  Gastrointestinal: Negative for vomiting.  Endocrine:       Negative aside from HPI  Genitourinary:       Neg aside from HPI   Musculoskeletal:       Per HPI, otherwise negative  Skin: Negative.   Neurological: Negative for syncope.     Physical Exam Updated Vital Signs BP (!) 147/104 (BP Location: Left Arm)   Pulse 79   Temp 98.1 F (36.7 C) (Oral)   Resp 18   Ht 5\' 4"  (1.626 m)   Wt 70.3 kg   SpO2 93%   BMI 26.61 kg/m   Physical Exam  Constitutional: She is oriented to person, place, and time. She appears well-developed and well-nourished. No distress.  HENT:  Head: Normocephalic and atraumatic.  Eyes: Conjunctivae and EOM are normal.  Cardiovascular: Normal rate and regular rhythm.  Pulmonary/Chest: No stridor. Tachypnea noted. She has decreased breath sounds. She has wheezes.  Abdominal: She exhibits no distension. There is no tenderness.  Musculoskeletal: She exhibits no edema.  Neurological: She is alert and oriented to person, place, and time. No cranial nerve deficit.  Skin: Skin is warm and dry.  Psychiatric: She has a normal mood and affect.  Nursing note and vitals reviewed.    ED Treatments / Results  Labs (all labs ordered are listed, but only abnormal results are displayed) Labs Reviewed  BASIC METABOLIC PANEL  CBC    EKG EKG Interpretation  Date/Time:  Wednesday October 11 2018 12:53:07 EST Ventricular Rate:  91 PR Interval:    QRS Duration: 156 QT Interval:  341 QTC Calculation: 420 R Axis:   63 Text Interpretation:  Sinus rhythm Nonspecific intraventricular conduction delay Baseline wander Abnormal ekg Confirmed by Carmin Muskrat [phone]) on 10/11/2018 4:24:21 PM   Radiology Dg Chest 2 View  Result Date: 10/11/2018 CLINICAL DATA:  Cough.  History of COPD. EXAM: CHEST - 2 VIEW COMPARISON:  12/19/2017 FINDINGS: Cardiomediastinal silhouette is normal. Mediastinal contours appear intact. There is no evidence of pneumothorax or lobar airspace consolidation. Bilateral bronchiectasis and mild peribronchial thickening. Osseous structures are without acute abnormality. Soft tissues are grossly normal. IMPRESSION: Bilateral bronchiectasis with mild peribronchial thickening consistent with chronic bronchitic changes. No evidence of lobar consolidation. Electronically Signed   By: Fidela Salisbury M.D.   On: 10/11/2018 16:22  Procedures Procedures (including critical care time)  Medications Ordered in ED Medications  predniSONE (DELTASONE) tablet 60 mg (has no administration in time range)  ipratropium-albuterol (DUONEB) 0.5-2.5 (3) MG/3ML nebulizer solution 3 mL (has no administration in time range)     Initial Impression / Assessment and Plan / ED Course  I have reviewed the triage vital signs and the nursing notes.  Pertinent labs & imaging results that were available during my care of the patient were reviewed by me and considered in my medical decision making (see chart for details).    Patient received albuterol, ipratropium, with steroids after initial evaluation.\  6:48 PM After DuoNeb, continuous neb, the patient has clear lung sounds bilaterally.  Update: This female with history of COPD, reactive airway disease presents with ongoing dyspnea, and on exam has wheezing, decreased breath sounds, but is afebrile, awake and alert, with no x-ray evidence for pneumonia. Patient improved with albuterol, Pratropium, steroids, and will be discharged with ongoing scheduled nebulizer treatments, as well as steroids to follow-up with primary care. Absent other complaints, pain, fever, low suspicion for other acute  new pathology.  Final Clinical Impressions(s) / ED Diagnoses  COPD exacerbation   Carmin Muskrat, MD 10/11/18 [phone]

## 2018-10-11 NOTE — Discharge Instructions (Signed)
As discussed, your evaluation today has been largely reassuring.  But, it is important that you monitor your condition carefully, and do not hesitate to return to the ED if you develop new, or concerning changes in your condition.  Be sure to use your albuterol every 4 hours for the next 2 days, then as needed.   Otherwise, please follow-up with your physician for appropriate ongoing care.

## 2018-11-23 ENCOUNTER — Encounter (HOSPITAL_COMMUNITY): Payer: Self-pay

## 2018-11-23 ENCOUNTER — Emergency Department (HOSPITAL_COMMUNITY): Payer: Medicaid Other

## 2018-11-23 ENCOUNTER — Other Ambulatory Visit: Payer: Self-pay

## 2018-11-23 ENCOUNTER — Emergency Department (HOSPITAL_COMMUNITY)
Admission: EM | Admit: 2018-11-23 | Discharge: 2018-11-24 | Disposition: A | Discharge status: Home / Self Care | Payer: Medicaid Other | Attending: Emergency Medicine | Admitting: Emergency Medicine

## 2018-11-23 DIAGNOSIS — F17210 Nicotine dependence, cigarettes, uncomplicated: Secondary | ICD-10-CM | POA: Insufficient documentation

## 2018-11-23 DIAGNOSIS — J441 Chronic obstructive pulmonary disease with (acute) exacerbation: Secondary | ICD-10-CM

## 2018-11-23 DIAGNOSIS — R0602 Shortness of breath: Secondary | ICD-10-CM | POA: Diagnosis present

## 2018-11-23 DIAGNOSIS — Z79899 Other long term (current) drug therapy: Secondary | ICD-10-CM | POA: Diagnosis not present

## 2018-11-23 LAB — BASIC METABOLIC PANEL
ANION GAP: 9 (ref 5–15)
BUN: 24 mg/dL — ABNORMAL HIGH (ref 6–20)
CALCIUM: 9.6 mg/dL (ref 8.9–10.3)
CO2: 25 mmol/L (ref 22–32)
CREATININE: 0.73 mg/dL (ref 0.44–1.00)
Chloride: 108 mmol/L (ref 98–111)
GLUCOSE: 92 mg/dL (ref 70–99)
Potassium: 4.1 mmol/L (ref 3.5–5.1)
Sodium: 142 mmol/L (ref 135–145)

## 2018-11-23 LAB — CBC
HCT: 43.9 % (ref 36.0–46.0)
Hemoglobin: 14.2 g/dL (ref 12.0–15.0)
MCH: 31.2 pg (ref 26.0–34.0)
MCHC: 32.3 g/dL (ref 30.0–36.0)
MCV: 96.5 fL (ref 80.0–100.0)
PLATELETS: 272 10*3/uL (ref 150–400)
RBC: 4.55 MIL/uL (ref 3.87–5.11)
RDW: 12.6 % (ref 11.5–15.5)
WBC: 9.2 10*3/uL (ref 4.0–10.5)
nRBC: 0 % (ref 0.0–0.2)

## 2018-11-23 LAB — I-STAT BETA HCG BLOOD, ED (MC, WL, AP ONLY): HCG, QUANTITATIVE: 12.9 m[IU]/mL — AB (ref <5)

## 2018-11-23 MED ORDER — ALBUTEROL SULFATE (2.5 MG/3ML) 0.083% IN NEBU
5.0000 mg | INHALATION_SOLUTION | Freq: Once | RESPIRATORY_TRACT | Status: AC
  Administered 2018-11-23: 5 mg via RESPIRATORY_TRACT
  Filled 2018-11-23: qty 6

## 2018-11-23 MED ORDER — PREDNISONE 20 MG PO TABS
60.0000 mg | ORAL_TABLET | Freq: Once | ORAL | Status: AC
  Administered 2018-11-23: 60 mg via ORAL
  Filled 2018-11-23: qty 3

## 2018-11-23 MED ORDER — ALBUTEROL (5 MG/ML) CONTINUOUS INHALATION SOLN
10.0000 mg/h | INHALATION_SOLUTION | Freq: Once | RESPIRATORY_TRACT | Status: AC
  Administered 2018-11-23: 10 mg/h via RESPIRATORY_TRACT
  Filled 2018-11-23: qty 20

## 2018-11-23 NOTE — ED Provider Notes (Signed)
[facility] DEPT Provider Note   CSN: [id number] Arrival date & time: 11/23/18  1850     History   Chief Complaint Chief Complaint  Patient presents with  . Shortness of Breath    HPI Lori Owens is a 58 y.o. female with medical history significant for COPD, asthma, tobacco abuse presents to the emergency department with a chief complaint of shortness of breath. This has been present for 2 weeks.  She states that she is short of breath at rest and it is worse with exertion. She has used her inhalers and nebulizer at home with minimal relief. Pt reports associated productive cough x 1 week. She feels like something is stuck in her throat and coughs up clear phlegm. She has taken multiple OTC cough medications without relief.  She denies pain. Pt is allergic to cats. Her daughter has cats and she has been staying at her house for multiple days at a time over the last month.  Pt denies fevers, chest pain, syncope, abdominal pain, congestion.   Past Medical History:  Diagnosis Date  . Asthma   . COPD (chronic obstructive pulmonary disease) ([place])   . Depression 01/18/2013  . Headache(784.0)   . Heart murmur   . Hypertension   . Melanoma ([place])   . Seasonal allergies   . Tobacco abuse 01/18/2013    Patient Active Problem List   Diagnosis Date Noted  . Anxiety 04/24/2016  . Exertional chest pain   . 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 Tobacco use disorder 05/07/2015  . Obesity 05/03/2015  . Excessive daytime sleepiness 03/03/2015  . Heart palpitations 12/24/2014  . Snoring 12/23/2014  . Witnessed apneic spells 12/23/2014  . Cardiomyopathy ([place]) 09/19/2014  . Mild persistent asthma 09/03/2014  . Asthma exacerbation 09/02/2014  . Suspicious mole 04/19/2013  . SOB (shortness of breath) 04/19/2013  . Hypokalemia 01/18/2013  . Dehydration 01/18/2013  . Tachycardia 01/18/2013    Past Surgical  History:  Procedure Laterality Date  . ABDOMINAL HYSTERECTOMY    . NOSE SURGERY     polyp removal from nasal cavity   . THROAT SURGERY    . TONSILLECTOMY       OB History   No obstetric history on file.      Home Medications    Prior to Admission medications   Medication Sig Start Date End Date Taking? Authorizing Provider  albuterol (PROVENTIL HFA;VENTOLIN HFA) 108 (90 Base) MCG/ACT inhaler Inhale 1-2 puffs into the lungs every 6 (six) hours as needed for wheezing or shortness of breath. 11/24/18   Nataleah Scioneaux E, PA-C  albuterol (PROVENTIL) (2.5 MG/3ML) 0.083% nebulizer solution Take 3 mLs (2.5 mg total) by nebulization every 4 (four) hours as needed for wheezing or shortness of breath. 10/11/18   Carmin Muskrat, MD  ALPRAZolam Duanne Moron) 0.25 MG tablet Take 0.25 mg by mouth at bedtime.    [provider]  azithromycin (ZITHROMAX Z-PAK) 250 MG tablet Take 500 mg on day 1, then 250 mg once a day for days 2, 3, 4, and 5. Patient not taking: Reported on 10/11/2018 12/19/17   Fatima Blank, MD  Biotin w/ Vitamins C & E (HAIR SKIN & NAILS GUMMIES) 1250-7.5-7.5 MCG-MG-UNT CHEW Chew 1 tablet by mouth daily.    [provider]  Cholecalciferol 5000 units capsule Take 5,000 Units by mouth daily.  05/30/15   [provider]  PARoxetine (PAXIL) 40 MG tablet Take 40 mg by mouth  every morning.    [provider]  predniSONE (DELTASONE) 20 MG tablet Take 2 tablets (40 mg total) by mouth daily with breakfast. For the next four days 10/11/18   Carmin Muskrat, MD  predniSONE (DELTASONE) 20 MG tablet Take 2 tablets (40 mg total) by mouth 2 (two) times daily with a meal for 4 days. 11/24/18 11/28/18  Jaquavious Mercer E, PA-C  ranitidine (ZANTAC) 150 MG capsule Take 1 capsule (150 mg total) by mouth daily. 11/18/17   Cardama, Grayce Sessions, MD  SINGULAIR 10 MG tablet Take 1 tablet (10 mg total) by mouth at bedtime. 05/12/15   Niel Hummer, NP  traZODone  (DESYREL) 150 MG tablet Take 150 mg by mouth at bedtime.    [provider]    Family History Family History  Problem Relation Age of Onset  . Diabetes Maternal Grandfather   . Heart disease Maternal Grandfather   . Cancer Maternal Grandfather   . Hypertension Maternal Grandfather     Social History Social History   Tobacco Use  . Smoking status: Current Every Day Smoker    Packs/day: 0.75    Years: 24.00    Pack years: 18.00    Types: Cigarettes  . Smokeless tobacco: Never Used  Substance Use Topics  . Alcohol use: No    Alcohol/week: 0.0 standard drinks  . Drug use: No     Allergies   Dust mite extract; Escitalopram oxalate; and Other   Review of Systems Review of Systems  Constitutional: Negative for chills and fever.  HENT: Negative for congestion, ear pain, sinus pain and sore throat.   Eyes: Negative for pain.  Respiratory: Positive for cough, shortness of breath and wheezing. Negative for chest tightness.   Cardiovascular: Negative for chest pain and palpitations.  Gastrointestinal: Negative for abdominal pain, diarrhea, nausea and vomiting.  Genitourinary: Negative for hematuria.  Musculoskeletal: Negative for back pain and neck pain.  Skin: Negative for rash and wound.  Neurological: Negative for syncope, weakness and headaches.     Physical Exam Updated Vital Signs BP (!) 153/113 (BP Location: Right Arm)   Pulse 91   Temp 98 F (36.7 C) (Oral)   Resp 20   SpO2 98%   Physical Exam Vitals signs and nursing note reviewed.  Constitutional:      Appearance: She is not ill-appearing or toxic-appearing.     Comments: Pt's SpO2 is 93% on 1L nasal cannula. She is speaking in full sentences, no pursed lip breathing, no accessory muscle use. She is not in acute distress.  HENT:     Head: Normocephalic and atraumatic.     Nose: Nose normal. No congestion.     Mouth/Throat:     Mouth: Mucous membranes are moist.     Pharynx: Oropharynx is  clear.  Eyes:     General: No scleral icterus.    Conjunctiva/sclera: Conjunctivae normal.  Neck:     Musculoskeletal: Normal range of motion.  Cardiovascular:     Rate and Rhythm: Regular rhythm. Tachycardia present.     Pulses: Normal pulses.     Heart sounds: Normal heart sounds.  Pulmonary:     Effort: Pulmonary effort is normal.     Comments: Pt with wheezing in all lung fields Abdominal:     General: There is no distension.     Palpations: Abdomen is soft.     Tenderness: There is no abdominal tenderness.  Musculoskeletal: Normal range of motion.  Skin:    General: Skin  is warm and dry.     Capillary Refill: Capillary refill takes less than 2 seconds.  Neurological:     Mental Status: She is alert and oriented to person, place, and time.     Motor: No weakness.  Psychiatric:        Behavior: Behavior normal.      ED Treatments / Results  Labs (all labs ordered are listed, but only abnormal results are displayed) Labs Reviewed  BASIC METABOLIC PANEL - Abnormal; Notable for the following components:      Result Value   BUN 24 (*)    All other components within normal limits  I-STAT BETA HCG BLOOD, ED ([facility], [facility], [facility] ONLY) - Abnormal; Notable for the following components:   I-stat hCG, quantitative 12.9 (*)    All other components within normal limits  CBC    EKG EKG Interpretation  Date/Time:  Thursday November 23 2018 19:50:45 EST Ventricular Rate:  95 PR Interval:    QRS Duration: 94 QT Interval:  353 QTC Calculation: 444 R Axis:   67 Text Interpretation:  Sinus rhythm Low voltage, precordial leads Baseline wander in lead(s) V1 Abnormal ekg Confirmed by Carmin Muskrat [phone]) on 11/23/2018 10:49:03 PM   Radiology Dg Chest 2 View  Result Date: 11/23/2018 CLINICAL DATA:  Shortness of breath EXAM: CHEST - 2 VIEW COMPARISON:  10/11/2018 FINDINGS: The heart size and mediastinal contours are within normal limits. Both lungs are clear. The visualized skeletal  structures are unremarkable. IMPRESSION: No active cardiopulmonary disease. Electronically Signed   By: Ulyses Jarred M.D.   On: 11/23/2018 23:14    Procedures Procedures (including critical care time)  Medications Ordered in ED Medications  albuterol (PROVENTIL) (2.5 MG/3ML) 0.083% nebulizer solution 5 mg (5 mg Nebulization Given 11/23/18 2142)  predniSONE (DELTASONE) tablet 60 mg (60 mg Oral Given 11/23/18 2356)  albuterol (PROVENTIL,VENTOLIN) solution continuous neb (10 mg/hr Nebulization Given 11/23/18 2322)     Initial Impression / Assessment and Plan / ED Course  I have reviewed the triage vital signs and the nursing notes.  Pertinent labs & imaging results that were available during my care of the patient were reviewed by me and considered in my medical decision making (see chart for details).    Pt is afebrile. She was wheezing in triage and given a nebulizer treatment. On my initial exam she was still wheezing and with low SpO2 of 93% on nasal cannula 1L. She then received continuous neb and prednisone. Her chest x-ray is without evidence of pneumonia. CBC and BMP unremarkable.Her EKG is sinus rhythm with low voltage.  After the continuous neb and steroids pt has improved.  Her blood pressure elevated in the emergency department today. Pt denies headache, change in vision, numbness, weakness, chest pain, dizziness, or lightheadedness therefore doubt hypertensive emergency. Discussed elevated blood pressure with the patient and the need for primary care follow up.. Discussed return precaution signs/symptoms for hypertensive emergency as listed above with the patient.   She has the productive cough and failed OTC treatment. Prescription for tessalon perles given at discharge. Pt was tachycardic in triage, but it resolved by discharge.  The patient was discussed with and seen by Dr. Vanita Panda who agrees with the treatment plan.   Final Clinical Impressions(s) / ED Diagnoses    Final diagnoses:  COPD exacerbation [facility])    ED Discharge Orders         Ordered    predniSONE (DELTASONE) 20 MG tablet  2 times daily with  meals     11/24/18 0019    albuterol (PROVENTIL HFA;VENTOLIN HFA) 108 (90 Base) MCG/ACT inhaler  Every 6 hours PRN     11/24/18 0019           Cherre Robins, PA-C 11/24/18 0128    Carmin Muskrat, MD 12/01/18 1207

## 2018-11-23 NOTE — ED Triage Notes (Signed)
Pt reports increased SOB on exertion over the last 2 weeks. She endorses of hx of COPD and states that she is compliant with her medications. She also reports lymphadenopathy under her L jaw. Denies chest pain.

## 2018-11-23 NOTE — Discharge Instructions (Addendum)
We believe that your symptoms are caused today by an exacerbation of your COPD. A prescription has been sent to your pharmacy for prednisone, please take as directed for the next 4 days. Continue the prescribed medications and any medications that you have at home for your COPD.    Your blood pressure was elevated today, please follow up with your primary doctor in 1 week for blood pressure recheck.   Follow up with your doctor as recommended.  If you develop any new or worsening symptoms, including but not limited to fever, persistent vomiting, worsening shortness of breath, or other symptoms that concern you, please return to the Emergency Department immediately.

## 2018-11-24 MED ORDER — BENZONATATE 100 MG PO CAPS: 100.0000 mg | ORAL_CAPSULE | Freq: Three times a day (TID) | ORAL | 0 refills | Status: DC

## 2018-11-24 MED ORDER — ALBUTEROL SULFATE HFA 108 (90 BASE) MCG/ACT IN AERS: 1.0000 | INHALATION_SPRAY | Freq: Four times a day (QID) | RESPIRATORY_TRACT | 0 refills | Status: DC | PRN

## 2018-11-24 MED ORDER — PREDNISONE 20 MG PO TABS: 40.0000 mg | ORAL_TABLET | Freq: Two times a day (BID) | ORAL | 0 refills | Status: AC

## 2019-05-02 ENCOUNTER — Emergency Department (HOSPITAL_COMMUNITY)
Admission: EM | Admit: 2019-05-02 | Discharge: 2019-05-02 | Disposition: A | Discharge status: Home / Self Care | Payer: Medicaid Other | Attending: Emergency Medicine | Admitting: Emergency Medicine

## 2019-05-02 ENCOUNTER — Other Ambulatory Visit: Payer: Self-pay

## 2019-05-02 ENCOUNTER — Encounter (HOSPITAL_COMMUNITY): Payer: Self-pay | Admitting: Emergency Medicine

## 2019-05-02 ENCOUNTER — Emergency Department (HOSPITAL_COMMUNITY): Payer: Medicaid Other

## 2019-05-02 DIAGNOSIS — J449 Chronic obstructive pulmonary disease, unspecified: Secondary | ICD-10-CM | POA: Insufficient documentation

## 2019-05-02 DIAGNOSIS — R0602 Shortness of breath: Secondary | ICD-10-CM | POA: Insufficient documentation

## 2019-05-02 DIAGNOSIS — Z79899 Other long term (current) drug therapy: Secondary | ICD-10-CM | POA: Diagnosis not present

## 2019-05-02 DIAGNOSIS — R61 Generalized hyperhidrosis: Secondary | ICD-10-CM | POA: Diagnosis not present

## 2019-05-02 DIAGNOSIS — I10 Essential (primary) hypertension: Secondary | ICD-10-CM | POA: Diagnosis not present

## 2019-05-02 DIAGNOSIS — F17210 Nicotine dependence, cigarettes, uncomplicated: Secondary | ICD-10-CM | POA: Insufficient documentation

## 2019-05-02 DIAGNOSIS — R079 Chest pain, unspecified: Secondary | ICD-10-CM | POA: Diagnosis not present

## 2019-05-02 LAB — CBC
HCT: 44 % (ref 36.0–46.0)
Hemoglobin: 14.6 g/dL (ref 12.0–15.0)
MCH: 31.3 pg (ref 26.0–34.0)
MCHC: 33.2 g/dL (ref 30.0–36.0)
MCV: 94.2 fL (ref 80.0–100.0)
Platelets: 257 10*3/uL (ref 150–400)
RBC: 4.67 MIL/uL (ref 3.87–5.11)
RDW: 12.1 % (ref 11.5–15.5)
WBC: 5.4 10*3/uL (ref 4.0–10.5)
nRBC: 0 % (ref 0.0–0.2)

## 2019-05-02 LAB — BASIC METABOLIC PANEL
Anion gap: 9 (ref 5–15)
BUN: 15 mg/dL (ref 6–20)
CO2: 26 mmol/L (ref 22–32)
Calcium: 9.4 mg/dL (ref 8.9–10.3)
Chloride: 105 mmol/L (ref 98–111)
Creatinine, Ser: 0.68 mg/dL (ref 0.44–1.00)
GFR calc Af Amer: >60 mL/min (ref >60)
GFR calc non Af Amer: >60 mL/min (ref >60)
Glucose, Bld: 92 mg/dL (ref 70–99)
Potassium: 4.5 mmol/L (ref 3.5–5.1)
Sodium: 140 mmol/L (ref 135–145)

## 2019-05-02 LAB — TROPONIN I (HIGH SENSITIVITY)
Troponin I (High Sensitivity): 3 ng/L (ref <18)
Troponin I (High Sensitivity): 3.3 ng/L (ref <18)

## 2019-05-02 MED ORDER — SODIUM CHLORIDE 0.9% FLUSH
3.0000 mL | Freq: Once | INTRAVENOUS | Status: AC
  Administered 2019-05-02: 3 mL via INTRAVENOUS

## 2019-05-02 NOTE — ED Notes (Signed)
ED Provider at bedside. 

## 2019-05-02 NOTE — ED Provider Notes (Signed)
[facility] DEPT Provider Note   CSN: [id number] Arrival date & time: 05/02/19  1332     History   Chief Complaint Chief Complaint  Patient presents with  . Chest Pain    HPI Lori Owens is a 58 y.o. female.  HPI: A 58 year old patient presents for evaluation of chest pain. Initial onset of pain was more than 6 hours ago. The patient's chest pain is described as heaviness/pressure/tightness and is not worse with exertion. The patient reports some diaphoresis. The patient's chest pain is middle- or left-sided, is not well-localized, is not sharp and does radiate to the arms/jaw/neck. The patient does not complain of nausea. The patient has smoked in the past 90 days. The patient has no history of stroke, has no history of peripheral artery disease, denies any history of treated diabetes, has no relevant family history of coronary artery disease (first degree relative at less than age 75), is not hypertensive, has no history of hypercholesterolemia and does not have an elevated BMI (>=30).    58 year old female history of COPD continues to smoke here with central chest discomfort radiating to her left arm increased with movement.  She has baseline shortness of breath no worsening.  Her discomfort started yesterday continues today despite taking an aspirin today.  It is associated with diaphoresis  for the last 3 days.  The history is provided by the patient.  Chest Pain Pain location:  Substernal area Pain quality: aching   Pain radiates to:  L arm Pain severity:  Moderate Onset quality:  Gradual Duration:  24 hours Timing:  Constant Progression:  Unchanged Chronicity:  New Context: at rest   Context: not trauma   Relieved by:  None tried Worsened by:  Movement Ineffective treatments:  Aspirin Associated symptoms: diaphoresis, lower extremity edema (chronic, unchanged) and shortness of breath (baseline)   Associated symptoms: no abdominal pain, no  altered mental status, no back pain, no fever, no headache, no nausea, no near-syncope, no palpitations, no syncope and no vomiting   Risk factors: smoking   Risk factors: no coronary artery disease, no diabetes mellitus and not female     Past Medical History:  Diagnosis Date  . Asthma   . COPD (chronic obstructive pulmonary disease) ([place])   . Depression 01/18/2013  . Headache(784.0)   . Heart murmur   . Hypertension   . Melanoma ([place])   . Seasonal allergies   . Tobacco abuse 01/18/2013    Patient Active Problem List   Diagnosis Date Noted  . Anxiety 04/24/2016  . Exertional chest pain   . 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 Tobacco use disorder 05/07/2015  . Obesity 05/03/2015  . Excessive daytime sleepiness 03/03/2015  . Heart palpitations 12/24/2014  . Snoring 12/23/2014  . Witnessed apneic spells 12/23/2014  . Cardiomyopathy ([place]) 09/19/2014  . Mild persistent asthma 09/03/2014  . Asthma exacerbation 09/02/2014  . Suspicious mole 04/19/2013  . SOB (shortness of breath) 04/19/2013  . Hypokalemia 01/18/2013  . Dehydration 01/18/2013  . Tachycardia 01/18/2013    Past Surgical History:  Procedure Laterality Date  . ABDOMINAL HYSTERECTOMY    . NOSE SURGERY     polyp removal from nasal cavity   . THROAT SURGERY    . TONSILLECTOMY       OB History   No obstetric history on file.      Home Medications    Prior to Admission medications   Medication Sig Start  Date End Date Taking? Authorizing Provider  albuterol (PROVENTIL HFA;VENTOLIN HFA) 108 (90 Base) MCG/ACT inhaler Inhale 1-2 puffs into the lungs every 6 (six) hours as needed for wheezing or shortness of breath. 11/24/18   Albrizze, Kaitlyn E, PA-C  albuterol (PROVENTIL) (2.5 MG/3ML) 0.083% nebulizer solution Take 3 mLs (2.5 mg total) by nebulization every 4 (four) hours as needed for wheezing or shortness of breath. 10/11/18   Carmin Muskrat, MD  ALPRAZolam  Duanne Moron) 0.25 MG tablet Take 0.25 mg by mouth at bedtime.    [provider]  azithromycin (ZITHROMAX Z-PAK) 250 MG tablet Take 500 mg on day 1, then 250 mg once a day for days 2, 3, 4, and 5. Patient not taking: Reported on 10/11/2018 12/19/17   Fatima Blank, MD  benzonatate (TESSALON) 100 MG capsule Take 1 capsule (100 mg total) by mouth every 8 (eight) hours. 11/24/18   Albrizze, Kaitlyn E, PA-C  Biotin w/ Vitamins C & E (HAIR SKIN & NAILS GUMMIES) 1250-7.5-7.5 MCG-MG-UNT CHEW Chew 1 tablet by mouth daily.    [provider]  Cholecalciferol 5000 units capsule Take 5,000 Units by mouth daily.  05/30/15   [provider]  PARoxetine (PAXIL) 40 MG tablet Take 40 mg by mouth every morning.    [provider]  predniSONE (DELTASONE) 20 MG tablet Take 2 tablets (40 mg total) by mouth daily with breakfast. For the next four days 10/11/18   Carmin Muskrat, MD  ranitidine (ZANTAC) 150 MG capsule Take 1 capsule (150 mg total) by mouth daily. 11/18/17   Cardama, Grayce Sessions, MD  SINGULAIR 10 MG tablet Take 1 tablet (10 mg total) by mouth at bedtime. 05/12/15   Niel Hummer, NP  traZODone (DESYREL) 150 MG tablet Take 150 mg by mouth at bedtime.    [provider]    Family History Family History  Problem Relation Age of Onset  . Diabetes Maternal Grandfather   . Heart disease Maternal Grandfather   . Cancer Maternal Grandfather   . Hypertension Maternal Grandfather     Social History Social History   Tobacco Use  . Smoking status: Current Every Day Smoker    Packs/day: 0.75    Years: 24.00    Pack years: 18.00    Types: Cigarettes  . Smokeless tobacco: Never Used  Substance Use Topics  . Alcohol use: No    Alcohol/week: 0.0 standard drinks  . Drug use: No     Allergies   Dust mite extract, Escitalopram oxalate, and Other   Review of Systems Review of Systems  Constitutional: Positive for diaphoresis. Negative for fever.  HENT:  Negative for sore throat.   Eyes: Negative for visual disturbance.  Respiratory: Positive for shortness of breath (baseline).   Cardiovascular: Positive for chest pain. Negative for palpitations, syncope and near-syncope.  Gastrointestinal: Negative for abdominal pain, nausea and vomiting.  Genitourinary: Negative for dysuria.  Musculoskeletal: Negative for back pain.  Skin: Negative for rash.  Neurological: Negative for headaches.     Physical Exam Updated Vital Signs BP (!) 150/106   Pulse 77   Temp 98.7 F (37.1 C) (Oral)   Resp 16   Ht 5\' 4"  (1.626 m)   SpO2 98%   BMI 26.61 kg/m   Physical Exam Vitals signs and nursing note reviewed.  Constitutional:      General: She is not in acute distress.    Appearance: She is well-developed.  HENT:     Head: Normocephalic and atraumatic.  Eyes:     Conjunctiva/sclera: Conjunctivae normal.  Neck:     Musculoskeletal: Neck supple.  Cardiovascular:     Rate and Rhythm: Normal rate and regular rhythm.     Heart sounds: Normal heart sounds. No murmur.  Pulmonary:     Effort: Pulmonary effort is normal. No respiratory distress.     Breath sounds: Normal breath sounds.  Abdominal:     Palpations: Abdomen is soft.     Tenderness: There is no abdominal tenderness.  Musculoskeletal: Normal range of motion.     Right lower leg: She exhibits no tenderness.     Left lower leg: She exhibits no tenderness.  Skin:    General: Skin is warm and dry.     Capillary Refill: Capillary refill takes less than 2 seconds.  Neurological:     General: No focal deficit present.     Mental Status: She is alert and oriented to person, place, and time.      ED Treatments / Results  Labs (all labs ordered are listed, but only abnormal results are displayed) Labs Reviewed  BASIC METABOLIC PANEL  CBC  TROPONIN I (HIGH SENSITIVITY)  TROPONIN I (HIGH SENSITIVITY)    EKG EKG Interpretation  Date/Time:  Wednesday May 02 2019 13:59:32 EDT  Ventricular Rate:  78 PR Interval:    QRS Duration: 87 QT Interval:  381 QTC Calculation: 434 R Axis:   32 Text Interpretation:  Sinus rhythm Low voltage, precordial leads similar to prior 1/20 Confirmed by Aletta Edouard [id number]) on 05/02/2019 2:06:25 PM   Radiology Dg Chest Port 1 View  Result Date: 05/02/2019 CLINICAL DATA:  Chest pain EXAM: PORTABLE CHEST 1 VIEW COMPARISON:  November 23, 2018 FINDINGS: No edema or consolidation. Heart size and pulmonary vascularity are normal. No adenopathy. No pneumothorax. No bone lesions. IMPRESSION: No edema or consolidation. Electronically Signed   By: Lowella Grip III M.D.   On: 05/02/2019 16:14    Procedures Procedures (including critical care time)  Medications Ordered in ED Medications  sodium chloride flush (NS) 0.9 % injection 3 mL (has no administration in time range)     Initial Impression / Assessment and Plan / ED Course  I have reviewed the triage vital signs and the nursing notes.  Pertinent labs & imaging results that were available during my care of the patient were reviewed by me and considered in my medical decision making (see chart for details).  Clinical Course as of May 01 1614  Wed May 01, 1861  1933 58 year old female with history of COPD, no history of cardiac disease here with 24 hours of substernal chest pain radiating to her left arm.  She has reproducible tenderness on exam.  EKG is benign.  Hypertensive here which she says she never is.  Differential diagnosis includes ACS, pneumonia, PE, musculoskeletal pain, costochondritis, reflux, aortic disease.   [MB]  5852 Patient is a HEAR score of 4.  Her initial troponin is 3.  Per our current protocol she will get a delta troponin II hours and if this is not rising significantly can be cleared from a cardiac standpoint of either obs or outpatient followup.    [MB]    Clinical Course User Index [MB] Hayden Rasmussen, MD   Lori Owens was evaluated in  Emergency Department on 05/02/2019 for the symptoms described in the history of present illness. She was evaluated in the context of the global COVID-19 pandemic, which necessitated consideration that the patient might be at risk  for infection with the SARS-CoV-2 virus that causes COVID-19. Institutional protocols and algorithms that pertain to the evaluation of patients at risk for COVID-19 are in a state of rapid change based on information released by regulatory bodies including the CDC and federal and state organizations. These policies and algorithms were followed during the patient's care in the ED.  HEAR Score: 4  Patient's troponin back and is  Final Clinical Impressions(s) / ED Diagnoses   Final diagnoses:  Nonspecific chest pain    ED Discharge Orders    None       Hayden Rasmussen, MD 05/02/19 2147

## 2019-05-02 NOTE — Discharge Instructions (Addendum)
You were seen in the emergency department for chest pain radiating into your left arm.  You had blood work EKG and chest x-ray that did not show any serious findings.  You should try some pain medication like Tylenol or ibuprofen and follow-up with your doctor.  Please return to the emergency department if any worsening symptoms.

## 2019-05-02 NOTE — ED Notes (Signed)
Pt left without discharge paperwork, pulled her IV out and dribbled blood across the floor, did not allow discharge vital signs. She was disgruntled because she did not get a Covid-19 test. She asked a staff member what symptoms she needed to have to get a Covid test. "A cough?  I have a cough."  This Probation officer did escort her to the door.  She said, "You did not do anything for me. "

## 2019-05-02 NOTE — ED Triage Notes (Signed)
Patient c/o intermittent mid chest pain that started yesterday, but more constant today. Patient stats chest pain radiates into the left arm.Patient denies N/V, diaphoresis, or SOB.

## 2019-05-25 ENCOUNTER — Emergency Department (HOSPITAL_COMMUNITY): Payer: Medicaid Other

## 2019-05-25 ENCOUNTER — Other Ambulatory Visit: Payer: Self-pay

## 2019-05-25 ENCOUNTER — Emergency Department (HOSPITAL_COMMUNITY)
Admission: EM | Admit: 2019-05-25 | Discharge: 2019-05-25 | Disposition: A | Discharge status: Home / Self Care | Payer: Medicaid Other | Attending: Emergency Medicine | Admitting: Emergency Medicine

## 2019-05-25 ENCOUNTER — Encounter (HOSPITAL_COMMUNITY): Payer: Self-pay | Admitting: Emergency Medicine

## 2019-05-25 DIAGNOSIS — J441 Chronic obstructive pulmonary disease with (acute) exacerbation: Secondary | ICD-10-CM | POA: Diagnosis not present

## 2019-05-25 DIAGNOSIS — Z85828 Personal history of other malignant neoplasm of skin: Secondary | ICD-10-CM | POA: Diagnosis not present

## 2019-05-25 DIAGNOSIS — R0602 Shortness of breath: Secondary | ICD-10-CM | POA: Diagnosis present

## 2019-05-25 DIAGNOSIS — I10 Essential (primary) hypertension: Secondary | ICD-10-CM | POA: Diagnosis not present

## 2019-05-25 DIAGNOSIS — J45909 Unspecified asthma, uncomplicated: Secondary | ICD-10-CM | POA: Diagnosis not present

## 2019-05-25 DIAGNOSIS — Z20828 Contact with and (suspected) exposure to other viral communicable diseases: Secondary | ICD-10-CM | POA: Diagnosis not present

## 2019-05-25 DIAGNOSIS — Z79899 Other long term (current) drug therapy: Secondary | ICD-10-CM | POA: Diagnosis not present

## 2019-05-25 DIAGNOSIS — F17210 Nicotine dependence, cigarettes, uncomplicated: Secondary | ICD-10-CM | POA: Insufficient documentation

## 2019-05-25 LAB — COMPREHENSIVE METABOLIC PANEL
ALT: 32 U/L (ref 0–44)
AST: 28 U/L (ref 15–41)
Albumin: 4 g/dL (ref 3.5–5.0)
Alkaline Phosphatase: 68 U/L (ref 38–126)
Anion gap: 8 (ref 5–15)
BUN: 13 mg/dL (ref 6–20)
CO2: 26 mmol/L (ref 22–32)
Calcium: 9.6 mg/dL (ref 8.9–10.3)
Chloride: 106 mmol/L (ref 98–111)
Creatinine, Ser: 0.73 mg/dL (ref 0.44–1.00)
GFR calc Af Amer: >60 mL/min (ref >60)
GFR calc non Af Amer: >60 mL/min (ref >60)
Glucose, Bld: 103 mg/dL — ABNORMAL HIGH (ref 70–99)
Potassium: 4.7 mmol/L (ref 3.5–5.1)
Sodium: 140 mmol/L (ref 135–145)
Total Bilirubin: 0.5 mg/dL (ref 0.3–1.2)
Total Protein: 6.9 g/dL (ref 6.5–8.1)

## 2019-05-25 LAB — CBC WITH DIFFERENTIAL/PLATELET
Abs Immature Granulocytes: 0.02 10*3/uL (ref 0.00–0.07)
Basophils Absolute: 0.1 10*3/uL (ref 0.0–0.1)
Basophils Relative: 1 %
Eosinophils Absolute: 0.3 10*3/uL (ref 0.0–0.5)
Eosinophils Relative: 6 %
HCT: 44.2 % (ref 36.0–46.0)
Hemoglobin: 14.4 g/dL (ref 12.0–15.0)
Immature Granulocytes: 0 %
Lymphocytes Relative: 30 %
Lymphs Abs: 1.5 10*3/uL (ref 0.7–4.0)
MCH: 31 pg (ref 26.0–34.0)
MCHC: 32.6 g/dL (ref 30.0–36.0)
MCV: 95.1 fL (ref 80.0–100.0)
Monocytes Absolute: 0.3 10*3/uL (ref 0.1–1.0)
Monocytes Relative: 6 %
Neutro Abs: 2.8 10*3/uL (ref 1.7–7.7)
Neutrophils Relative %: 57 %
Platelets: 258 10*3/uL (ref 150–400)
RBC: 4.65 MIL/uL (ref 3.87–5.11)
RDW: 12.2 % (ref 11.5–15.5)
WBC: 4.9 10*3/uL (ref 4.0–10.5)
nRBC: 0 % (ref 0.0–0.2)

## 2019-05-25 LAB — SARS CORONAVIRUS 2 BY RT PCR (HOSPITAL ORDER, PERFORMED IN ~~LOC~~ HOSPITAL LAB): SARS Coronavirus 2: NEGATIVE

## 2019-05-25 MED ORDER — ALBUTEROL SULFATE (2.5 MG/3ML) 0.083% IN NEBU: 2.5000 mg | INHALATION_SOLUTION | Freq: Four times a day (QID) | RESPIRATORY_TRACT | 12 refills | Status: DC | PRN

## 2019-05-25 MED ORDER — MAGNESIUM SULFATE 2 GM/50ML IV SOLN
2.0000 g | Freq: Once | INTRAVENOUS | Status: AC
  Administered 2019-05-25: 2 g via INTRAVENOUS
  Filled 2019-05-25: qty 50

## 2019-05-25 MED ORDER — IPRATROPIUM-ALBUTEROL 0.5-2.5 (3) MG/3ML IN SOLN
3.0000 mL | Freq: Once | RESPIRATORY_TRACT | Status: AC
  Administered 2019-05-25: 3 mL via RESPIRATORY_TRACT
  Filled 2019-05-25: qty 3

## 2019-05-25 MED ORDER — METHYLPREDNISOLONE SODIUM SUCC 125 MG IJ SOLR
125.0000 mg | Freq: Once | INTRAMUSCULAR | Status: AC
  Administered 2019-05-25: 125 mg via INTRAVENOUS
  Filled 2019-05-25: qty 2

## 2019-05-25 MED ORDER — ALBUTEROL SULFATE (2.5 MG/3ML) 0.083% IN NEBU
2.5000 mg | INHALATION_SOLUTION | Freq: Once | RESPIRATORY_TRACT | Status: AC
  Administered 2019-05-25: 2.5 mg via RESPIRATORY_TRACT
  Filled 2019-05-25: qty 3

## 2019-05-25 MED ORDER — DOXYCYCLINE HYCLATE 100 MG PO CAPS: 100.0000 mg | ORAL_CAPSULE | Freq: Two times a day (BID) | ORAL | 0 refills | Status: DC

## 2019-05-25 MED ORDER — PREDNISONE 10 MG PO TABS: 20.0000 mg | ORAL_TABLET | Freq: Every day | ORAL | 0 refills | Status: DC

## 2019-05-25 MED ORDER — ALBUTEROL SULFATE HFA 108 (90 BASE) MCG/ACT IN AERS
2.0000 | INHALATION_SPRAY | Freq: Once | RESPIRATORY_TRACT | Status: AC
  Administered 2019-05-25: 2 via RESPIRATORY_TRACT
  Filled 2019-05-25: qty 6.7

## 2019-05-25 NOTE — ED Triage Notes (Signed)
Pt here for eval of 1 week of COPD exacerbation, using inhaler with some relief. Pt reports she needs prednisone. Using nebs at home. Denies fevers. States she is in between PCP.

## 2019-05-25 NOTE — ED Provider Notes (Signed)
[facility] EMERGENCY DEPARTMENT Provider Note   CSN: [id number] Arrival date & time: 05/25/19  0957     History   Chief Complaint Chief Complaint  Patient presents with  . COPD    HPI Lori Owens is a 58 y.o. female.     Patient complains of shortness of breath.  She has a history of COPD.  She is run out of her medicines.  She thinks she needs some prednisone  The history is provided by the patient and a significant other.  COPD This is a new problem. The current episode started more than 2 days ago. The problem occurs constantly. The problem has not changed since onset.Associated symptoms include shortness of breath. Pertinent negatives include no chest pain, no abdominal pain and no headaches. Nothing aggravates the symptoms. Nothing relieves the symptoms. She has tried nothing for the symptoms.    Past Medical History:  Diagnosis Date  . Asthma   . COPD (chronic obstructive pulmonary disease) ([place])   . Depression 01/18/2013  . Headache(784.0)   . Heart murmur   . Hypertension   . Melanoma ([place])   . Seasonal allergies   . Tobacco abuse 01/18/2013    Patient Active Problem List   Diagnosis Date Noted  . Anxiety 04/24/2016  . Exertional chest pain   . 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 Tobacco use disorder 05/07/2015  . Obesity 05/03/2015  . Excessive daytime sleepiness 03/03/2015  . Heart palpitations 12/24/2014  . Snoring 12/23/2014  . Witnessed apneic spells 12/23/2014  . Cardiomyopathy ([place]) 09/19/2014  . Mild persistent asthma 09/03/2014  . Asthma exacerbation 09/02/2014  . Suspicious mole 04/19/2013  . SOB (shortness of breath) 04/19/2013  . Hypokalemia 01/18/2013  . Dehydration 01/18/2013  . Tachycardia 01/18/2013    Past Surgical History:  Procedure Laterality Date  . ABDOMINAL HYSTERECTOMY    . NOSE SURGERY     polyp removal from nasal cavity   . THROAT SURGERY    .  TONSILLECTOMY       OB History   No obstetric history on file.      Home Medications    Prior to Admission medications   Medication Sig Start Date End Date Taking? Authorizing Provider  ALPRAZolam Duanne Moron) 0.5 MG tablet Take 0.5 mg by mouth 2 (two) times daily as needed for anxiety.   Yes [provider]  Biotin w/ Vitamins C & E (HAIR SKIN & NAILS GUMMIES) 1250-7.5-7.5 MCG-MG-UNT CHEW Chew 1 tablet by mouth daily.   Yes [provider]  Cholecalciferol 5000 units capsule Take 5,000 Units by mouth daily.  05/30/15  Yes [provider]  fluticasone (FLONASE) 50 MCG/ACT nasal spray Place 2 sprays into both nostrils daily as needed for allergies or rhinitis.   Yes [provider]  PARoxetine (PAXIL) 40 MG tablet Take 40 mg by mouth every morning.   Yes [provider]  SINGULAIR 10 MG tablet Take 1 tablet (10 mg total) by mouth at bedtime. Patient taking differently: Take 10 mg by mouth daily.  05/12/15  Yes Niel Hummer, NP  SUPER B COMPLEX/C PO Take 1 tablet by mouth daily.   Yes [provider]  albuterol (PROVENTIL) (2.5 MG/3ML) 0.083% nebulizer solution Take 3 mLs (2.5 mg total) by nebulization every 6 (six) hours as needed for wheezing or shortness of breath. 05/25/19   Milton Ferguson, MD  azithromycin (ZITHROMAX Z-PAK) 250 MG tablet Take 500 mg on day  1, then 250 mg once a day for days 2, 3, 4, and 5. Patient not taking: Reported on 10/11/2018 12/19/17   Fatima Blank, MD  benzonatate (TESSALON) 100 MG capsule Take 1 capsule (100 mg total) by mouth every 8 (eight) hours. Patient not taking: Reported on 05/02/2019 11/24/18   Albrizze, Verline Lema E, PA-C  doxycycline (VIBRAMYCIN) 100 MG capsule Take 1 capsule (100 mg total) by mouth 2 (two) times daily. One po bid x 7 days 05/25/19   Milton Ferguson, MD  predniSONE (DELTASONE) 10 MG tablet Take 2 tablets (20 mg total) by mouth daily. 05/25/19   Milton Ferguson, MD  ranitidine (ZANTAC) 150  MG capsule Take 1 capsule (150 mg total) by mouth daily. Patient not taking: Reported on 05/02/2019 11/18/17   Fatima Blank, MD    Family History Family History  Problem Relation Age of Onset  . Diabetes Maternal Grandfather   . Heart disease Maternal Grandfather   . Cancer Maternal Grandfather   . Hypertension Maternal Grandfather     Social History Social History   Tobacco Use  . Smoking status: Current Every Day Smoker    Packs/day: 0.75    Years: 24.00    Pack years: 18.00    Types: Cigarettes  . Smokeless tobacco: Never Used  Substance Use Topics  . Alcohol use: No    Alcohol/week: 0.0 standard drinks  . Drug use: No     Allergies   Cymbalta [duloxetine hcl], Dust mite extract, Escitalopram oxalate, and Other   Review of Systems Review of Systems  Constitutional: Negative for appetite change and fatigue.  HENT: Negative for congestion, ear discharge and sinus pressure.   Eyes: Negative for discharge.  Respiratory: Positive for shortness of breath. Negative for cough.   Cardiovascular: Negative for chest pain.  Gastrointestinal: Negative for abdominal pain and diarrhea.  Genitourinary: Negative for frequency and hematuria.  Musculoskeletal: Negative for back pain.  Skin: Negative for rash.  Neurological: Negative for seizures and headaches.  Psychiatric/Behavioral: Negative for hallucinations.     Physical Exam Updated Vital Signs BP 135/88   Pulse 67   Temp 98 F (36.7 C) (Oral)   Resp 16   SpO2 95%   Physical Exam Vitals signs and nursing note reviewed.  Constitutional:      Appearance: She is well-developed.  HENT:     Head: Normocephalic.     Nose: Nose normal.  Eyes:     General: No scleral icterus.    Conjunctiva/sclera: Conjunctivae normal.  Neck:     Musculoskeletal: Neck supple.     Thyroid: No thyromegaly.  Cardiovascular:     Rate and Rhythm: Normal rate and regular rhythm.     Heart sounds: No murmur. No friction rub.  No gallop.   Pulmonary:     Breath sounds: No stridor. Wheezing present. No rales.  Chest:     Chest wall: No tenderness.  Abdominal:     General: There is no distension.     Tenderness: There is no abdominal tenderness. There is no rebound.  Musculoskeletal: Normal range of motion.  Lymphadenopathy:     Cervical: No cervical adenopathy.  Skin:    Findings: No erythema or rash.  Neurological:     Mental Status: She is oriented to person, place, and time.     Motor: No abnormal muscle tone.     Coordination: Coordination normal.  Psychiatric:        Behavior: Behavior normal.      ED  Treatments / Results  Labs (all labs ordered are listed, but only abnormal results are displayed) Labs Reviewed  COMPREHENSIVE METABOLIC PANEL - Abnormal; Notable for the following components:      Result Value   Glucose, Bld 103 (*)    All other components within normal limits  SARS CORONAVIRUS 2 (HOSPITAL ORDER, [facility])  CBC WITH DIFFERENTIAL/PLATELET    EKG None  Radiology Dg Chest Port 1 View  Result Date: 05/25/2019 CLINICAL DATA:  Shortness of breath.  Cough. EXAM: PORTABLE CHEST 1 VIEW COMPARISON:  May 02, 2019 FINDINGS: Lungs are clear. Heart size and pulmonary vascularity are normal. No adenopathy. No bone lesions. IMPRESSION: No edema or consolidation. Electronically Signed   By: Lowella Grip III M.D.   On: 05/25/2019 11:56    Procedures Procedures (including critical care time)  Medications Ordered in ED Medications  methylPREDNISolone sodium succinate (SOLU-MEDROL) 125 mg/2 mL injection 125 mg (125 mg Intravenous Given 05/25/19 1135)  magnesium sulfate IVPB 2 g 50 mL (0 g Intravenous Stopped 05/25/19 1249)  albuterol (VENTOLIN HFA) 108 (90 Base) MCG/ACT inhaler 2 puff (2 puffs Inhalation Given 05/25/19 1135)  ipratropium-albuterol (DUONEB) 0.5-2.5 (3) MG/3ML nebulizer solution 3 mL (3 mLs Nebulization Given 05/25/19 1307)  albuterol  (PROVENTIL) (2.5 MG/3ML) 0.083% nebulizer solution 2.5 mg (2.5 mg Nebulization Given 05/25/19 1307)     Initial Impression / Assessment and Plan / ED Course  I have reviewed the triage vital signs and the nursing notes.  Pertinent labs & imaging results that were available during my care of the patient were reviewed by me and considered in my medical decision making (see chart for details).        Patient with exacerbation of COPD.  She improved with neb treatments and magnesium and steroids.  She will be discharged home with prednisone doxycycline and I have refilled her Proventil neb solution  Final Clinical Impressions(s) / ED Diagnoses   Final diagnoses:  COPD exacerbation [facility])    ED Discharge Orders         Ordered    albuterol (PROVENTIL) (2.5 MG/3ML) 0.083% nebulizer solution  Every 6 hours PRN     05/25/19 1338    predniSONE (DELTASONE) 10 MG tablet  Daily     05/25/19 1338    doxycycline (VIBRAMYCIN) 100 MG capsule  2 times daily     05/25/19 1338           Milton Ferguson, MD 05/25/19 1342

## 2019-05-25 NOTE — Discharge Instructions (Addendum)
Follow-up with your family doctor next week for recheck.  Return if any problems

## 2019-10-12 ENCOUNTER — Other Ambulatory Visit: Payer: Self-pay

## 2019-10-12 ENCOUNTER — Emergency Department (HOSPITAL_COMMUNITY)
Admission: EM | Admit: 2019-10-12 | Discharge: 2019-10-12 | Disposition: A | Discharge status: Home / Self Care | Payer: Medicare Other | Attending: Emergency Medicine | Admitting: Emergency Medicine

## 2019-10-12 ENCOUNTER — Encounter (HOSPITAL_COMMUNITY): Payer: Self-pay

## 2019-10-12 ENCOUNTER — Emergency Department (HOSPITAL_COMMUNITY): Payer: Medicare Other

## 2019-10-12 DIAGNOSIS — Y939 Activity, unspecified: Secondary | ICD-10-CM | POA: Insufficient documentation

## 2019-10-12 DIAGNOSIS — X58XXXA Exposure to other specified factors, initial encounter: Secondary | ICD-10-CM | POA: Diagnosis not present

## 2019-10-12 DIAGNOSIS — S299XXA Unspecified injury of thorax, initial encounter: Secondary | ICD-10-CM | POA: Diagnosis present

## 2019-10-12 DIAGNOSIS — Y998 Other external cause status: Secondary | ICD-10-CM | POA: Diagnosis not present

## 2019-10-12 DIAGNOSIS — Y929 Unspecified place or not applicable: Secondary | ICD-10-CM | POA: Insufficient documentation

## 2019-10-12 DIAGNOSIS — R1011 Right upper quadrant pain: Secondary | ICD-10-CM | POA: Insufficient documentation

## 2019-10-12 DIAGNOSIS — R109 Unspecified abdominal pain: Secondary | ICD-10-CM

## 2019-10-12 DIAGNOSIS — F17210 Nicotine dependence, cigarettes, uncomplicated: Secondary | ICD-10-CM | POA: Insufficient documentation

## 2019-10-12 DIAGNOSIS — S2231XK Fracture of one rib, right side, subsequent encounter for fracture with nonunion: Secondary | ICD-10-CM | POA: Diagnosis not present

## 2019-10-12 DIAGNOSIS — J449 Chronic obstructive pulmonary disease, unspecified: Secondary | ICD-10-CM | POA: Insufficient documentation

## 2019-10-12 LAB — COMPREHENSIVE METABOLIC PANEL
ALT: 34 U/L (ref 0–44)
AST: 26 U/L (ref 15–41)
Albumin: 4.4 g/dL (ref 3.5–5.0)
Alkaline Phosphatase: 63 U/L (ref 38–126)
Anion gap: 9 (ref 5–15)
BUN: 20 mg/dL (ref 6–20)
CO2: 23 mmol/L (ref 22–32)
Calcium: 9.4 mg/dL (ref 8.9–10.3)
Chloride: 106 mmol/L (ref 98–111)
Creatinine, Ser: 0.76 mg/dL (ref 0.44–1.00)
GFR calc Af Amer: >60 mL/min (ref >60)
GFR calc non Af Amer: >60 mL/min (ref >60)
Glucose, Bld: 94 mg/dL (ref 70–99)
Potassium: 3.9 mmol/L (ref 3.5–5.1)
Sodium: 138 mmol/L (ref 135–145)
Total Bilirubin: 1 mg/dL (ref 0.3–1.2)
Total Protein: 7.3 g/dL (ref 6.5–8.1)

## 2019-10-12 LAB — CBC
HCT: 43.2 % (ref 36.0–46.0)
Hemoglobin: 14.6 g/dL (ref 12.0–15.0)
MCH: 31.5 pg (ref 26.0–34.0)
MCHC: 33.8 g/dL (ref 30.0–36.0)
MCV: 93.1 fL (ref 80.0–100.0)
Platelets: 258 10*3/uL (ref 150–400)
RBC: 4.64 MIL/uL (ref 3.87–5.11)
RDW: 11.9 % (ref 11.5–15.5)
WBC: 5.7 10*3/uL (ref 4.0–10.5)
nRBC: 0 % (ref 0.0–0.2)

## 2019-10-12 LAB — URINALYSIS, ROUTINE W REFLEX MICROSCOPIC
Bilirubin Urine: NEGATIVE
Glucose, UA: NEGATIVE mg/dL
Hgb urine dipstick: NEGATIVE
Ketones, ur: 5 mg/dL — AB
Leukocytes,Ua: NEGATIVE
Nitrite: NEGATIVE
Protein, ur: NEGATIVE mg/dL
Specific Gravity, Urine: >1.046 — ABNORMAL HIGH (ref 1.005–1.030)
pH: 5 (ref 5.0–8.0)

## 2019-10-12 LAB — LIPASE, BLOOD: Lipase: 36 U/L (ref 11–51)

## 2019-10-12 MED ORDER — IOHEXOL 300 MG/ML  SOLN
100.0000 mL | Freq: Once | INTRAMUSCULAR | Status: AC | PRN
  Administered 2019-10-12: 100 mL via INTRAVENOUS

## 2019-10-12 MED ORDER — SODIUM CHLORIDE 0.9% FLUSH
3.0000 mL | Freq: Once | INTRAVENOUS | Status: AC
  Administered 2019-10-12: 3 mL via INTRAVENOUS

## 2019-10-12 MED ORDER — LIDOCAINE 5 % EX PTCH
1.0000 | MEDICATED_PATCH | CUTANEOUS | Status: DC
  Administered 2019-10-12: 1 via TRANSDERMAL
  Filled 2019-10-12: qty 1

## 2019-10-12 MED ORDER — LIDOCAINE 5 % EX PTCH: 1.0000 | MEDICATED_PATCH | CUTANEOUS | 0 refills | Status: DC

## 2019-10-12 NOTE — ED Notes (Signed)
ED Provider at bedside. 

## 2019-10-12 NOTE — ED Notes (Signed)
Patient transported to CT 

## 2019-10-12 NOTE — ED Provider Notes (Signed)
I assumed care of Lori Owens from Lori Blue, PA-C at shift change, please see his note for full H&P.  Briefly Lori Owens is here for evaluation of right-sided abdominal pain that has been going on for 1 year however has been significantly worse in the past week.   Physical Exam  BP (!) 144/107 (BP Location: Left Arm)   Pulse 95   Temp 97.8 F (36.6 C) (Oral)   Resp 18   Ht 5\' 4"  (1.626 m)   Wt 74.8 kg   SpO2 97%   BMI 28.32 kg/m   Physical Exam Vitals signs and nursing note reviewed.  Constitutional:      General: Lori Owens is not in acute distress.    Appearance: Lori Owens is well-developed. Lori Owens is not diaphoretic.  HENT:     Head: Normocephalic and atraumatic.  Eyes:     General: No scleral icterus.       Right eye: No discharge.        Left eye: No discharge.     Conjunctiva/sclera: Conjunctivae normal.  Neck:     Musculoskeletal: Normal range of motion.  Cardiovascular:     Rate and Rhythm: Normal rate and regular rhythm.  Pulmonary:     Effort: Pulmonary effort is normal. No respiratory distress.     Breath sounds: No stridor.  Abdominal:     General: There is no distension.     Palpations: Abdomen is soft.     Comments: There is tenderness to palpation along the lower chest/upper abdomen on the right side extending around into her back consistent with pain from rib fracture.  Musculoskeletal:        General: No deformity.  Skin:    General: Skin is warm and dry.  Neurological:     Mental Status: Lori Owens is alert.     Motor: No abnormal muscle tone.  Psychiatric:        Behavior: Behavior normal.     ED Course/Procedures   Clinical Course as of Oct 11 2336  Fri Oct 12, 2019  1754 Lori Owens re-evaluated, Lori Owens reports taht Lori Owens had previously broken a rib about 5 years ago. Lori Owens reports that Lori Owens is sexually active with her boyfriend  who is almost 300lbs and wonders if that may have irritated the rib.    [EH]    Clinical Course User Index [EH] Lorin Glass, PA-C     Procedures  Ct Abdomen Pelvis W Contrast  Result Date: 10/12/2019 CLINICAL DATA:  Acute right upper abdominal pain over the past week, but also intermittently over the past year. EXAM: CT ABDOMEN AND PELVIS WITH CONTRAST TECHNIQUE: Multidetector CT imaging of the abdomen and pelvis was performed using the standard protocol following bolus administration of intravenous contrast. CONTRAST:  126mL OMNIPAQUE IOHEXOL 300 MG/ML  SOLN COMPARISON:  11/18/2017 FINDINGS: Lower chest: Airway thickening is present, suggesting bronchitis or reactive airways disease. Hepatobiliary: The liver and gallbladder appear unremarkable. No biliary dilatation or gallbladder wall thickening is identified. Pancreas: Unremarkable Spleen: Unremarkable Adrenals/Urinary Tract: 1.4 by 0.8 cm hypodense lesion favoring cyst in the left mid kidney, previously 1.3 by 0.8 cm. Otherwise unremarkable. Stomach/Bowel: Scattered colonic diverticulosis including the ascending colon and proximal transverse colon, without active diverticulitis identified. Mild sigmoid colon diverticulosis. Normal appendix. Vascular/Lymphatic: Aortoiliac atherosclerotic vascular disease. Reproductive: Uterus absent.  Adnexa unremarkable. Other: No supplemental non-categorized findings. 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 Lower lumbar spondylosis and degenerative disc disease resulting in impingement at L4-5 and L5-S1. CLINICAL DATA:  Acute right upper abdominal pain over the past week, but also intermittently over the past year. EXAM: CT ABDOMEN AND PELVIS WITH CONTRAST TECHNIQUE: Multidetector CT imaging of the abdomen and pelvis was performed using the standard protocol following bolus administration of intravenous contrast. CONTRAST:  157mL OMNIPAQUE IOHEXOL 300 MG/ML  SOLN COMPARISON:  11/18/2017 FINDINGS: Lower chest: Airway thickening is present, suggesting bronchitis or reactive airways disease. Hepatobiliary: The  liver and gallbladder appear unremarkable. No biliary dilatation or gallbladder wall thickening is identified. Pancreas: Unremarkable Spleen: Unremarkable Adrenals/Urinary Tract: 1.4 by 0.8 cm hypodense lesion favoring cyst in the left mid kidney, previously 1.3 by 0.8 cm. Otherwise unremarkable. Stomach/Bowel: Scattered colonic diverticulosis including the ascending colon and proximal transverse colon, without active diverticulitis identified. Mild sigmoid colon diverticulosis. Normal appendix. Vascular/Lymphatic: Aortoiliac atherosclerotic vascular disease. Reproductive: Uterus absent. Adnexa unremarkable. Other: No supplemental non-categorized findings. 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 Lower lumbar spondylosis and degenerative disc disease resulting in impingement at L4-5 and L5-S1. IMPRESSION: 1. A cause for the Lori Owens's right upper quadrant abdominal pain is not identified. 2. Airway thickening is present, suggesting bronchitis or reactive airways disease. 3. 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 Lower lumbar spondylosis and degenerative disc disease resulting in impingement at L4-5 and L5-S1. Scattered colonic diverticulosis without active diverticulitis. Aortic Atherosclerosis (ICD10-I70.0). Electronically Signed   By: Van Clines M.D.   On: 10/12/2019 16:47    Labs Reviewed  URINALYSIS, ROUTINE W REFLEX MICROSCOPIC - Abnormal; Notable for the following components:      Result Value   Specific Gravity, Urine >1.046 (*)    Ketones, ur 5 (*)    All other components within normal limits  LIPASE, BLOOD  COMPREHENSIVE METABOLIC PANEL  CBC    MDM  Plan is to follow-up on CT scan.  If CT scan and labs are normal then consider outpatient GI consult.  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  Lori Owens states that  Lori Owens was intimate with her significant other and is concerned if that or recently moving may have exacerbated this old injury.  We discussed multiple incidental findings including bronchitis, disc disease, and diverticulosis without diverticulitis and Lori Owens states her understanding.  CMP, CBC and lipase are all unremarkable.  UA is significant for elevated specific gravity consistent with dehydration.  We discussed the importance of p.o. rehydration.  Lori Owens is given a lidocaine patch for her pain.  We discussed possible outpatient follow-up with PCP for consideration of surgical referral for nonunion.  Rx given for lidocaine patches.    Given recent right upper quadrant ultrasound and CT scan without evidence of right upper quadrant abdominal pathology along with normal CMP I suspect that her pain is caused by this rib fracture rather than an intra-abdominal cause.  Return precautions were discussed with Lori Owens who states their understanding.  At the time of discharge Lori Owens denied any unaddressed complaints or concerns.  Lori Owens is agreeable for discharge home.    Lorin Glass, PA-C 10/12/19 2341    Valarie Merino, MD 10/15/19 1037

## 2019-10-12 NOTE — ED Provider Notes (Signed)
[facility] DEPT Provider Note   CSN: [id number] Arrival date & time: 10/12/19  1351     History   Chief Complaint Chief Complaint  Patient presents with  . Abdominal Pain    HPI Lori Owens is a 58 y.o. female PMH significant for COPD, tobacco use, asthma, and melanoma who presents to the ED with a 1 week history of right upper quadrant abdominal pain.  Patient believes that this pain has been going on for approximately 1 year, but she typically ignores it and it goes away.  She had a right upper quadrant ultrasound obtained approximately 1 month ago that demonstrated no evidence of cholelithiasis or cholecystitis, but she remains skeptical and her nurse friend told her that she will require a HIDA scan.  She reports that her mother had diabetes, so she has been on a low-carb, low sugar diabetic diet for approximately 2 years with significant intended weight loss. She denies any fevers, chills, cough, chest pain, shortness of breath, or dysuria.  No obvious aggravating or alleviating factors.     HPI  Past Medical History:  Diagnosis Date  . Asthma   . COPD (chronic obstructive pulmonary disease) ([place])   . Depression 01/18/2013  . Headache(784.0)   . Heart murmur   . Melanoma ([place])   . Seasonal allergies   . Tobacco abuse 01/18/2013    Patient Active Problem List   Diagnosis Date Noted  . Anxiety 04/24/2016  . Exertional chest pain   . 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 Tobacco use disorder 05/07/2015  . Obesity 05/03/2015  . Excessive daytime sleepiness 03/03/2015  . Heart palpitations 12/24/2014  . Snoring 12/23/2014  . Witnessed apneic spells 12/23/2014  . Cardiomyopathy ([place]) 09/19/2014  . Mild persistent asthma 09/03/2014  . Asthma exacerbation 09/02/2014  . Suspicious mole 04/19/2013  . SOB (shortness of breath) 04/19/2013  . Hypokalemia 01/18/2013  . Dehydration 01/18/2013   . Tachycardia 01/18/2013    Past Surgical History:  Procedure Laterality Date  . ABDOMINAL HYSTERECTOMY    . NOSE SURGERY     polyp removal from nasal cavity   . THROAT SURGERY    . TONSILLECTOMY       OB History   No obstetric history on file.      Home Medications    Prior to Admission medications   Medication Sig Start Date End Date Taking? Authorizing Provider  albuterol (PROVENTIL) (2.5 MG/3ML) 0.083% nebulizer solution Take 3 mLs (2.5 mg total) by nebulization every 6 (six) hours as needed for wheezing or shortness of breath. 05/25/19   Milton Ferguson, MD  ALPRAZolam Duanne Moron) 0.5 MG tablet Take 0.5 mg by mouth 2 (two) times daily as needed for anxiety.    [provider]  azithromycin (ZITHROMAX Z-PAK) 250 MG tablet Take 500 mg on day 1, then 250 mg once a day for days 2, 3, 4, and 5. Patient not taking: Reported on 10/11/2018 12/19/17   Fatima Blank, MD  benzonatate (TESSALON) 100 MG capsule Take 1 capsule (100 mg total) by mouth every 8 (eight) hours. Patient not taking: Reported on 05/02/2019 11/24/18   Albrizze, Verline Lema E, PA-C  Biotin w/ Vitamins C & E (HAIR SKIN & NAILS GUMMIES) 1250-7.5-7.5 MCG-MG-UNT CHEW Chew 1 tablet by mouth daily.    [provider]  Cholecalciferol 5000 units capsule Take 5,000 Units by mouth daily.  05/30/15   [provider]  doxycycline (VIBRAMYCIN) 100 MG capsule  Take 1 capsule (100 mg total) by mouth 2 (two) times daily. One po bid x 7 days 05/25/19   Milton Ferguson, MD  fluticasone [facility]) 50 MCG/ACT nasal spray Place 2 sprays into both nostrils daily as needed for allergies or rhinitis.    [provider]  PARoxetine (PAXIL) 40 MG tablet Take 40 mg by mouth every morning.    [provider]  predniSONE (DELTASONE) 10 MG tablet Take 2 tablets (20 mg total) by mouth daily. 05/25/19   Milton Ferguson, MD  ranitidine (ZANTAC) 150 MG capsule Take 1 capsule (150 mg total) by mouth daily. Patient  not taking: Reported on 05/02/2019 11/18/17   Fatima Blank, MD  SINGULAIR 10 MG tablet Take 1 tablet (10 mg total) by mouth at bedtime. Patient taking differently: Take 10 mg by mouth daily.  05/12/15   Niel Hummer, NP  SUPER B COMPLEX/C PO Take 1 tablet by mouth daily.    [provider]    Family History Family History  Problem Relation Age of Onset  . Diabetes Maternal Grandfather   . Heart disease Maternal Grandfather   . Cancer Maternal Grandfather   . Hypertension Maternal Grandfather     Social History Social History   Tobacco Use  . Smoking status: Current Every Day Smoker    Packs/day: 0.75    Years: 24.00    Pack years: 18.00    Types: Cigarettes  . Smokeless tobacco: Never Used  Substance Use Topics  . Alcohol use: No    Alcohol/week: 0.0 standard drinks  . Drug use: No     Allergies   Cymbalta [duloxetine hcl], Dust mite extract, Escitalopram oxalate, and Other   Review of Systems Review of Systems  All other systems reviewed and are negative.    Physical Exam Updated Vital Signs BP (!) 144/107 (BP Location: Left Arm)   Pulse 95   Temp 97.8 F (36.6 C) (Oral)   Resp 18   Ht 5\' 4"  (1.626 m)   Wt 74.8 kg   SpO2 97%   BMI 28.32 kg/m   Physical Exam Vitals signs and nursing note reviewed. Exam conducted with a chaperone present.  Constitutional:      Appearance: Normal appearance.  HENT:     Head: Normocephalic and atraumatic.     Mouth/Throat:     Pharynx: Oropharynx is clear.  Eyes:     General: No scleral icterus.    Conjunctiva/sclera: Conjunctivae normal.  Neck:     Musculoskeletal: Normal range of motion. No neck rigidity.  Cardiovascular:     Rate and Rhythm: Normal rate and regular rhythm.     Pulses: Normal pulses.     Heart sounds: Normal heart sounds.  Pulmonary:     Effort: Pulmonary effort is normal. No respiratory distress.     Breath sounds: Normal breath sounds.  Abdominal:     Comments: Soft,  nondistended.  TTP in epigastric, suprapubic, periumbilical, RLQ, and most notably in RUQ.  No left-sided abdominal TTP.  No overlying skin changes.  No masses appreciated.  No guarding.  Skin:    General: Skin is dry.     Capillary Refill: Capillary refill takes less than 2 seconds.  Neurological:     Mental Status: She is alert and oriented to person, place, and time.     GCS: GCS eye subscore is 4. GCS verbal subscore is 5. GCS motor subscore is 6.  Psychiatric:        Mood and  Affect: Mood normal.        Behavior: Behavior normal.        Thought Content: Thought content normal.      ED Treatments / Results  Labs (all labs ordered are listed, but only abnormal results are displayed) Labs Reviewed  LIPASE, BLOOD  COMPREHENSIVE METABOLIC PANEL  CBC  URINALYSIS, ROUTINE W REFLEX MICROSCOPIC    EKG None  Radiology No results found.  Procedures Procedures (including critical care time)  Medications Ordered in ED Medications  sodium chloride flush (NS) 0.9 % injection 3 mL (3 mLs Intravenous Given 10/12/19 1447)     Initial Impression / Assessment and Plan / ED Course  I have reviewed the triage vital signs and the nursing notes.  Pertinent labs & imaging results that were available during my care of the patient were reviewed by me and considered in my medical decision making (see chart for details).        Given significant abdominal tenderness to palpation, obtained CT abdomen and pelvis with contrast.  CBC, CMP, and lipase are interpreted and demonstrate no abnormalities.  No transaminitis or elevated alkaline phosphate concerning for gallbladder etiology.  No electrolyte abnormalities or leukocytosis concerning for infection.   She does not appear to be in any acute distress and her lab work and vital signs have been reassuring.  If UA is normal and CT abdomen and pelvis demonstrate no abnormalities, patient can be discharged with gastroenterology follow-up for  further evaluation and management.   At shift change care was transferred to Wyn Quaker, PA-C who will follow pending studies, re-evaluate, and determine disposition.      Final Clinical Impressions(s) / ED Diagnoses   Final diagnoses:  Right sided abdominal pain    ED Discharge Orders    None       Reita Chard 10/12/19 1535    Valarie Merino, MD 11/22/19 [phone]

## 2019-10-12 NOTE — ED Triage Notes (Signed)
Patient c/o RUQ pain x 1 week. Patient also c/o N/V/D.

## 2019-10-12 NOTE — Discharge Instructions (Signed)
As we discussed today I suspect that your pain is caused by your right rib it has been nonhealing.  Please avoid any strenuous activities.  Please schedule a follow-up appointment with primary care doctor.

## 2020-01-24 ENCOUNTER — Ambulatory Visit (INDEPENDENT_AMBULATORY_CARE_PROVIDER_SITE_OTHER): Payer: Medicare Other | Admitting: Family Medicine

## 2020-01-24 ENCOUNTER — Other Ambulatory Visit: Payer: Self-pay

## 2020-01-24 ENCOUNTER — Encounter: Payer: Self-pay | Admitting: Family Medicine

## 2020-01-24 VITALS — BP 120/92 | HR 90 | Ht 65.0 in | Wt 174.0 lb

## 2020-01-24 DIAGNOSIS — Z1231 Encounter for screening mammogram for malignant neoplasm of breast: Secondary | ICD-10-CM

## 2020-01-24 DIAGNOSIS — Z0000 Encounter for general adult medical examination without abnormal findings: Secondary | ICD-10-CM

## 2020-01-24 DIAGNOSIS — J4530 Mild persistent asthma, uncomplicated: Secondary | ICD-10-CM | POA: Diagnosis not present

## 2020-01-24 DIAGNOSIS — G479 Sleep disorder, unspecified: Secondary | ICD-10-CM | POA: Diagnosis not present

## 2020-01-24 DIAGNOSIS — R7309 Other abnormal glucose: Secondary | ICD-10-CM | POA: Diagnosis not present

## 2020-01-24 LAB — POCT GLYCOSYLATED HEMOGLOBIN (HGB A1C): Hemoglobin A1C: 5.6 % (ref 4.0–5.6)

## 2020-01-24 MED ORDER — ALBUTEROL SULFATE (2.5 MG/3ML) 0.083% IN NEBU: 2.5000 mg | INHALATION_SOLUTION | Freq: Four times a day (QID) | RESPIRATORY_TRACT | 12 refills | Status: DC | PRN

## 2020-01-24 MED ORDER — SINGULAIR 10 MG PO TABS: 10.0000 mg | ORAL_TABLET | Freq: Every day | ORAL | Status: DC

## 2020-01-24 MED ORDER — ZOLPIDEM TARTRATE 5 MG PO TABS: 5.0000 mg | ORAL_TABLET | Freq: Every evening | ORAL | 1 refills | Status: DC | PRN

## 2020-01-24 MED ORDER — FLUTICASONE PROPIONATE 50 MCG/ACT NA SUSP: 2.0000 | Freq: Every day | NASAL | 2 refills | Status: DC | PRN

## 2020-01-24 MED ORDER — PAROXETINE HCL 40 MG PO TABS: 40.0000 mg | ORAL_TABLET | ORAL | 2 refills | Status: DC

## 2020-01-24 NOTE — Patient Instructions (Addendum)
It was a pleasure to meet you today summation point I am glad we could deal with some of the issues you are facing right now.  I have sent prescription refills in for you.  I would like to do some routine lab work including checking your kidney function now, assessing for diabetes with a hemoglobin A1c, and checking your cholesterol with a lipid panel.  I would also like to check your TSH because you are having the hot flashes, depression, and difficulty sleeping.  These results will go to your MyChart but I will call you if there is anything concerning.  I am also sending an order in for referral for mammogram.  I have attached some information regarding helping treat constipation.  If you have any questions or concerns please feel free to reach out to schedule an appointment.  Have a wonderful afternoon!   Constipation, Adult Constipation is when a person has fewer bowel movements in a week than normal, has difficulty having a bowel movement, or has stools that are dry, hard, or larger than normal. Constipation may be caused by an underlying condition. It may become worse with age if a person takes certain medicines and does not take in enough fluids. Follow these instructions at home: Eating and drinking   Eat foods that have a lot of fiber, such as fresh fruits and vegetables, whole grains, and beans.  Limit foods that are high in fat, low in fiber, or overly processed, such as french fries, hamburgers, cookies, candies, and soda.  Drink enough fluid to keep your urine clear or pale yellow. General instructions  Exercise regularly or as told by your health care provider.  Go to the restroom when you have the urge to go. Do not hold it in.  Take over-the-counter and prescription medicines only as told by your health care provider. These include any fiber supplements.  Practice pelvic floor retraining exercises, such as deep breathing while relaxing the lower abdomen and pelvic floor  relaxation during bowel movements.  Watch your condition for any changes.  Keep all follow-up visits as told by your health care provider. This is important. Contact a health care provider if:  You have pain that gets worse.  You have a fever.  You do not have a bowel movement after 4 days.  You vomit.  You are not hungry.  You lose weight.  You are bleeding from the anus.  You have thin, pencil-like stools. Get help right away if:  You have a fever and your symptoms suddenly get worse.  You leak stool or have blood in your stool.  Your abdomen is bloated.  You have severe pain in your abdomen.  You feel dizzy or you faint. This information is not intended to replace advice given to you by your health care provider. Make sure you discuss any questions you have with your health care provider. Document Revised: 10/07/2017 Document Reviewed: 04/14/2016 Elsevier Patient Education  2020 [employer].

## 2020-01-24 NOTE — Assessment & Plan Note (Signed)
Patient had recent blood glucose that was mildly elevated.  She does have concerns for diabetes and reports maternal history of diabetes. -Hemoglobin A1c today -BMP today

## 2020-01-24 NOTE — Assessment & Plan Note (Signed)
Patient reports trying multiple sleep aids in the past including trazodone, melatonin, and Xanax most recently.  Has not been able to have prescription refilled due to not having a PCP.  Is interested in trying Ambien.  Patient reports that she follows proper sleep hygiene but will lay down and is unable to sleep because her mind is racing. -Discussed continuation of proper sleep hygiene -Discussed risks and benefits of Ambien -Prescribed Ambien

## 2020-01-24 NOTE — Assessment & Plan Note (Signed)
Patient's most recent mammogram was in 2018 -Referral sent for screening mammogram

## 2020-01-24 NOTE — Progress Notes (Signed)
SUBJECTIVE:   CHIEF COMPLAINT / HPI:  New patient visit  Current concerns Medication refills   Bowel issues Patient reports issues with constipation.  She says that this is a chronic issue that she is been battling with for many years.  Reports having bowel movements every 3 to 5 days with her most recent bowel movement being today.  She required 4 doses of laxatives before she was able to have a bowel movement.  Reports she has tried increasing her fiber intake and tried to make dietary changes to help with her bowel movements.  Sleep Patient has history of sleep issues.  Her previous PCP tried her on multiple medications.  She thinks it is approximately 5 different medications that she has been tried on including trazodone, melatonin, and 3 others but she is unsure of.  She has most recently given Xanax and took that for approximately a year and a half which she said helped with sleep but she has not had a PCP in the last 3 months and has been unable to get any prescription.  Patient reports she has not tried Ambien in the past but would be interested in doing so.  Discussed proper sleep hygiene with patient.  Of note patient scored a 11 on her PHQ-9.  9 of the 11 points came from sections regarding sleep.  Patient reports otherwise her depression is well controlled on her Paxil.  Past medical history Depression, anxiety, melanoma on back and chest.   Hot flashes- has tried supplements  COPD   Past surgical history Hysterectomy with bilateral salpingo-oophorectomy in 1991 "Throat surgery" - around 2005  Rhinoplasty x 2   Family history Diabetes- mother and maternal grandfather  MI- Grandfather  Cancer- grandfather(unsure of kind) HTN- mother, grandfather, brother, son   Social history Lives by herself. Takes care of mother who lives 4 miles from her with her daughter. One dog named Odi. 2 grown children and 2 grandchildren. Retired and disabled, Investment banker, corporate. Occasional  EtOH, Smoked 1 ppd for 20 years, no longer smokes. Occasional marijuana. Denies other illicit substances. Sexually active with men, uses protection. No hx of STIs.    Health maintenance Colonoscopy-2016 Mammogram-2018 Pap-2018 Pneumonia shot Shingles-2011 Heart stress test-2018 Echocardiogram-2018   OBJECTIVE:   BP (!) 120/92   Pulse 90   Ht 5\' 5"  (1.651 m)   Wt 174 lb (78.9 kg)   SpO2 96%   BMI 28.96 kg/m General: NAD, appears tired but is comfortable HEENT: Atraumatic. Normocephalic Normal oropharynx without erythema, lesions, exudate.  Pupils equal and reactive to light, full EOM Neck: No cervical lymphadenopathy.  Supple Cardiac: RRR, no m/r/g Respiratory: CTAB, normal work of breathing Abdomen: soft, nontender, nondistended, bowel sounds normal Skin: warm and dry, no rashes noted MSK: 5 out of 5 strength in upper and lower extremities bilaterally. Neuro: alert and oriented, cranial nerves grossly intact    Office Visit from 01/24/2020 in [place]  PHQ-9 Total Score  11       ASSESSMENT/PLAN:   Other abnormal glucose Patient had recent blood glucose that was mildly elevated.  She does have concerns for diabetes and reports maternal history of diabetes. -Hemoglobin A1c today -BMP today  Breast cancer screening by mammogram Patient's most recent mammogram was in 2018 -Referral sent for screening mammogram  Difficulty sleeping Patient reports trying multiple sleep aids in the past including trazodone, melatonin, and Xanax most recently.  Has not been able to have prescription refilled due to  not having a PCP.  Is interested in trying Ambien.  Patient reports that she follows proper sleep hygiene but will lay down and is unable to sleep because her mind is racing. -Discussed continuation of proper sleep hygiene -Discussed risks and benefits of Ambien -Prescribed Ambien  Health maintenance examination Patient is overweight.  Most recent  lipid panel showed normal results that was last done in 2017.  Patient also has issues with depression and poor sleep. -Lipid panel today -TSH today    Gifford Shave, MD [place]

## 2020-01-24 NOTE — Assessment & Plan Note (Signed)
Patient is overweight.  Most recent lipid panel showed normal results that was last done in 2017.  Patient also has issues with depression and poor sleep. -Lipid panel today -TSH today

## 2020-01-25 ENCOUNTER — Encounter: Payer: Self-pay | Admitting: Family Medicine

## 2020-01-25 ENCOUNTER — Telehealth: Payer: Self-pay

## 2020-01-25 ENCOUNTER — Telehealth: Payer: Self-pay | Admitting: *Deleted

## 2020-01-25 LAB — LIPID PANEL
Chol/HDL Ratio: 3.6 ratio (ref 0.0–4.4)
Cholesterol, Total: 207 mg/dL — ABNORMAL HIGH (ref 100–199)
HDL: 58 mg/dL (ref >39)
LDL Chol Calc (NIH): 131 mg/dL — ABNORMAL HIGH (ref 0–99)
Triglycerides: 101 mg/dL (ref 0–149)
VLDL Cholesterol Cal: 18 mg/dL (ref 5–40)

## 2020-01-25 LAB — BASIC METABOLIC PANEL
BUN/Creatinine Ratio: 19 (ref 9–23)
BUN: 14 mg/dL (ref 6–24)
CO2: 20 mmol/L (ref 20–29)
Calcium: 10.6 mg/dL — ABNORMAL HIGH (ref 8.7–10.2)
Chloride: 102 mmol/L (ref 96–106)
Creatinine, Ser: 0.75 mg/dL (ref 0.57–1.00)
GFR calc Af Amer: 102 mL/min/{1.73_m2} (ref >59)
GFR calc non Af Amer: 88 mL/min/{1.73_m2} (ref >59)
Glucose: 91 mg/dL (ref 65–99)
Potassium: 4 mmol/L (ref 3.5–5.2)
Sodium: 140 mmol/L (ref 134–144)

## 2020-01-25 LAB — TSH: TSH: 2.26 u[IU]/mL (ref 0.450–4.500)

## 2020-01-25 MED ORDER — ALBUTEROL SULFATE HFA 108 (90 BASE) MCG/ACT IN AERS: 2.0000 | INHALATION_SPRAY | Freq: Four times a day (QID) | RESPIRATORY_TRACT | 11 refills | Status: DC | PRN

## 2020-01-25 NOTE — Addendum Note (Signed)
Addended by: Concepcion Living on: 01/25/2020 02:50 PM   Modules accepted: Orders

## 2020-01-25 NOTE — Telephone Encounter (Signed)
Received fax stating that the albuterol/Proventil is not covered under Part D and that an Rx must be resent with diagnosis code for medicare part B. Lori Owens, CMA

## 2020-01-25 NOTE — Telephone Encounter (Signed)
Patient calls nurse line regarding missing a phone call from PCP regarding lab results.   Went over lab results with patient, informed cholesterol was slightly elevated and that I would send a message to PCP regarding plan for management of cholesterol levels.   To PCP  Please advise  Talbot Grumbling, RN

## 2020-01-25 NOTE — Telephone Encounter (Signed)
Attempted to call the patient back.  There was no answer.  Her cholesterol was mildly elevated but there is no need for medication management at this time.  I was also calling to let her know that I sent a new prescription for her albuterol but that it would be an inhaler rather than the nebulizer.  I will attempt to call her back tomorrow or Sunday.

## 2020-01-27 NOTE — Telephone Encounter (Signed)
Spoke with patient informed her of her lab results from her previous visit.  She was happy that she would not have to start on any medications at this time.  Also informed her that I had sent a new prescription for the albuterol inhaler to her pharmacy and it should be approved.  She has any questions or concerns she knows to call the clinic.Marland Kitchen

## 2020-01-29 ENCOUNTER — Telehealth: Payer: Self-pay | Admitting: *Deleted

## 2020-01-29 NOTE — Telephone Encounter (Signed)
Pt lm on nurse line that Ambien is not working.  Is requesting and increase or change to something else. Christen Bame, CMA

## 2020-01-31 NOTE — Telephone Encounter (Signed)
Spoke with patient regarding her sleep issues.  She reports that the Ambien has not helping her sleep in the she has been taking 2 Ambien as well as 4 Tylenol PMs to sleep.  We discussed that she is to take her meds as prescribed but I will work on making medication changes for her.  I will precept this with an attending either this afternoon or tomorrow and get back to the patient.

## 2020-03-27 ENCOUNTER — Other Ambulatory Visit: Payer: Self-pay | Admitting: Family Medicine

## 2020-04-09 ENCOUNTER — Ambulatory Visit (INDEPENDENT_AMBULATORY_CARE_PROVIDER_SITE_OTHER): Payer: Medicare Other | Admitting: Family Medicine

## 2020-04-09 ENCOUNTER — Other Ambulatory Visit: Payer: Self-pay

## 2020-04-09 ENCOUNTER — Encounter: Payer: Self-pay | Admitting: Family Medicine

## 2020-04-09 VITALS — BP 112/74 | HR 86 | Wt 169.0 lb

## 2020-04-09 DIAGNOSIS — I999 Unspecified disorder of circulatory system: Secondary | ICD-10-CM | POA: Insufficient documentation

## 2020-04-09 NOTE — Patient Instructions (Signed)
Patient declines AVS,

## 2020-04-09 NOTE — Progress Notes (Signed)
° ° °  SUBJECTIVE:   CHIEF COMPLAINT / HPI: Concern for vascular status of legs  Patient comes in saying that she does have some healthcare anxieties, but she is most focused right now on the vascular status of her legs.  She does say that she has no functional deficits and is actually started going to the pool and swimming regularly.  She has been stooling approximately 30 minutes to an hour per day without any significant issues.  She does say that occasionally she will get a "little tingling feeling "in her legs bilaterally.  She says every now and then to get this in her hands as well but has never had any paralysis or true paresthesia.  She wants to know if this might be a concern for blood flow there.  Has not had any claudication or injury.  She has not had any new wounding or injury.  She does say that she is lost quite a bit of weight and has been adopting a low-carb diet has controlled her diabetes with this.  She has plenty of energy and is generally quite active now.  PERTINENT  PMH / PSH:   OBJECTIVE:   BP 112/74    Pulse 86    Wt 169 lb (76.7 kg)    SpO2 99%    BMI 28.12 kg/m   General: Alert and pleasant Vascular exam: Patient's ABI was bilaterally within normal range at 1.33, pulses are appropriately palpable, good cap refill.  Good sensation in both feet.  There is no skin wounding.  She does have bilateral varicose veins but there is no pathologic erythema/pain or indication of occlusion.   ASSESSMENT/PLAN:   Circulation problem Patient is concerned about circulation problem because then she started significant workout regimen few weeks ago she occasionally gets a "little tingling "in her feet or her arms.  She has been swimming up to an hour per day with no functional deficit.  ABI normal bilaterally with 1.33 ratio.  Very reassuring exam in this very physically active patient.  Patient is reassured that we do not see any immediate concerns here in the office but that she  wanted to go see vascular we would put in the referral.  She says she is not worried by the aesthetics of varicose veins and will let us know if any of her symptoms change     [name] Sires, [place]

## 2020-04-09 NOTE — Assessment & Plan Note (Signed)
Patient is concerned about circulation problem because then she started significant workout regimen few weeks ago she occasionally gets a "little tingling "in her feet or her arms.  She has been swimming up to an hour per day with no functional deficit.  ABI normal bilaterally with 1.33 ratio.  Very reassuring exam in this very physically active patient.  Patient is reassured that we do not see any immediate concerns here in the office but that she wanted to go see vascular we would put in the referral.  She says she is not worried by the aesthetics of varicose veins and will let us know if any of her symptoms change

## 2020-04-13 ENCOUNTER — Telehealth: Payer: Self-pay | Admitting: Family Medicine

## 2020-04-13 MED ORDER — ZOLPIDEM TARTRATE 5 MG PO TABS: 5.0000 mg | ORAL_TABLET | Freq: Every evening | ORAL | 0 refills | Status: DC | PRN

## 2020-04-13 NOTE — Telephone Encounter (Signed)
Spoke with patient regarding her Ambien prescription. She reports that although I am giving her 5 mg nightly she has had to double up because that is the only way she reports she get some sleep. I discussed with her proper dosing for females and she says that that is just not working. She reports she has taken in the past 4 Tylenol PMs at night or 1 mg of Xanax at night to sleep. She has tried multiple other medications including melatonin, trazodone, and "other psych medications" which affected how she felt and she did not like them. She is asking for any other recommendations or help help her sleep. I have forwarded the message to Dr. Valentina Lucks for assistance.

## 2020-04-13 NOTE — Addendum Note (Signed)
Addended by: Concepcion Living on: 04/13/2020 06:17 PM   Modules accepted: Orders

## 2020-04-13 NOTE — Telephone Encounter (Signed)
-----   Message from [name] Sires, DO sent at 04/09/2020  3:56 PM EDT ----- Regarding: Patient request Patient was requesting a refill of her Ambien.  It looks like you have had some discussions in the past which looks like a potential discrepancy between when you felt a refill was appropriate and when she did, so I told her I would defer to youDr. Criss Rosales

## 2020-05-02 ENCOUNTER — Emergency Department (HOSPITAL_COMMUNITY)
Admission: EM | Admit: 2020-05-02 | Discharge: 2020-05-02 | Disposition: A | Discharge status: Home / Self Care | Payer: Medicare Other | Attending: Emergency Medicine | Admitting: Emergency Medicine

## 2020-05-02 ENCOUNTER — Other Ambulatory Visit: Payer: Self-pay

## 2020-05-02 ENCOUNTER — Encounter (HOSPITAL_COMMUNITY): Payer: Self-pay

## 2020-05-02 DIAGNOSIS — R1110 Vomiting, unspecified: Secondary | ICD-10-CM | POA: Diagnosis not present

## 2020-05-02 DIAGNOSIS — R519 Headache, unspecified: Secondary | ICD-10-CM | POA: Diagnosis not present

## 2020-05-02 LAB — CBC
HCT: 46.4 % — ABNORMAL HIGH (ref 36.0–46.0)
Hemoglobin: 16.1 g/dL — ABNORMAL HIGH (ref 12.0–15.0)
MCH: 32.1 pg (ref 26.0–34.0)
MCHC: 34.7 g/dL (ref 30.0–36.0)
MCV: 92.6 fL (ref 80.0–100.0)
Platelets: 276 10*3/uL (ref 150–400)
RBC: 5.01 MIL/uL (ref 3.87–5.11)
RDW: 11.9 % (ref 11.5–15.5)
WBC: 7.9 10*3/uL (ref 4.0–10.5)
nRBC: 0 % (ref 0.0–0.2)

## 2020-05-02 LAB — COMPREHENSIVE METABOLIC PANEL
ALT: 32 U/L (ref 0–44)
AST: 23 U/L (ref 15–41)
Albumin: 4.7 g/dL (ref 3.5–5.0)
Alkaline Phosphatase: 63 U/L (ref 38–126)
Anion gap: 13 (ref 5–15)
BUN: 13 mg/dL (ref 6–20)
CO2: 24 mmol/L (ref 22–32)
Calcium: 10 mg/dL (ref 8.9–10.3)
Chloride: 103 mmol/L (ref 98–111)
Creatinine, Ser: 0.7 mg/dL (ref 0.44–1.00)
GFR calc Af Amer: >60 mL/min (ref >60)
GFR calc non Af Amer: >60 mL/min (ref >60)
Glucose, Bld: 116 mg/dL — ABNORMAL HIGH (ref 70–99)
Potassium: 4.2 mmol/L (ref 3.5–5.1)
Sodium: 140 mmol/L (ref 135–145)
Total Bilirubin: 0.7 mg/dL (ref 0.3–1.2)
Total Protein: 7.7 g/dL (ref 6.5–8.1)

## 2020-05-02 LAB — LIPASE, BLOOD: Lipase: 28 U/L (ref 11–51)

## 2020-05-02 MED ORDER — ONDANSETRON 4 MG PO TBDP
4.0000 mg | ORAL_TABLET | Freq: Once | ORAL | Status: AC | PRN
  Administered 2020-05-02: 4 mg via ORAL
  Filled 2020-05-02: qty 1

## 2020-05-02 MED ORDER — SODIUM CHLORIDE 0.9% FLUSH: 3.0000 mL | Freq: Once | INTRAVENOUS | Status: DC

## 2020-05-02 NOTE — ED Notes (Signed)
Pt eloped from waiting area. Called 3x for room placement.

## 2020-05-02 NOTE — ED Triage Notes (Signed)
Patient c/o headache and vomiting since yesterday.

## 2020-11-27 IMAGING — DX PORTABLE CHEST - 1 VIEW
1 series · 1 of 1 positions shown · non-contrast
Comparison: November 23, 2018

CLINICAL DATA: Chest pain

EXAM:
PORTABLE CHEST 1 VIEW

[chest ap]
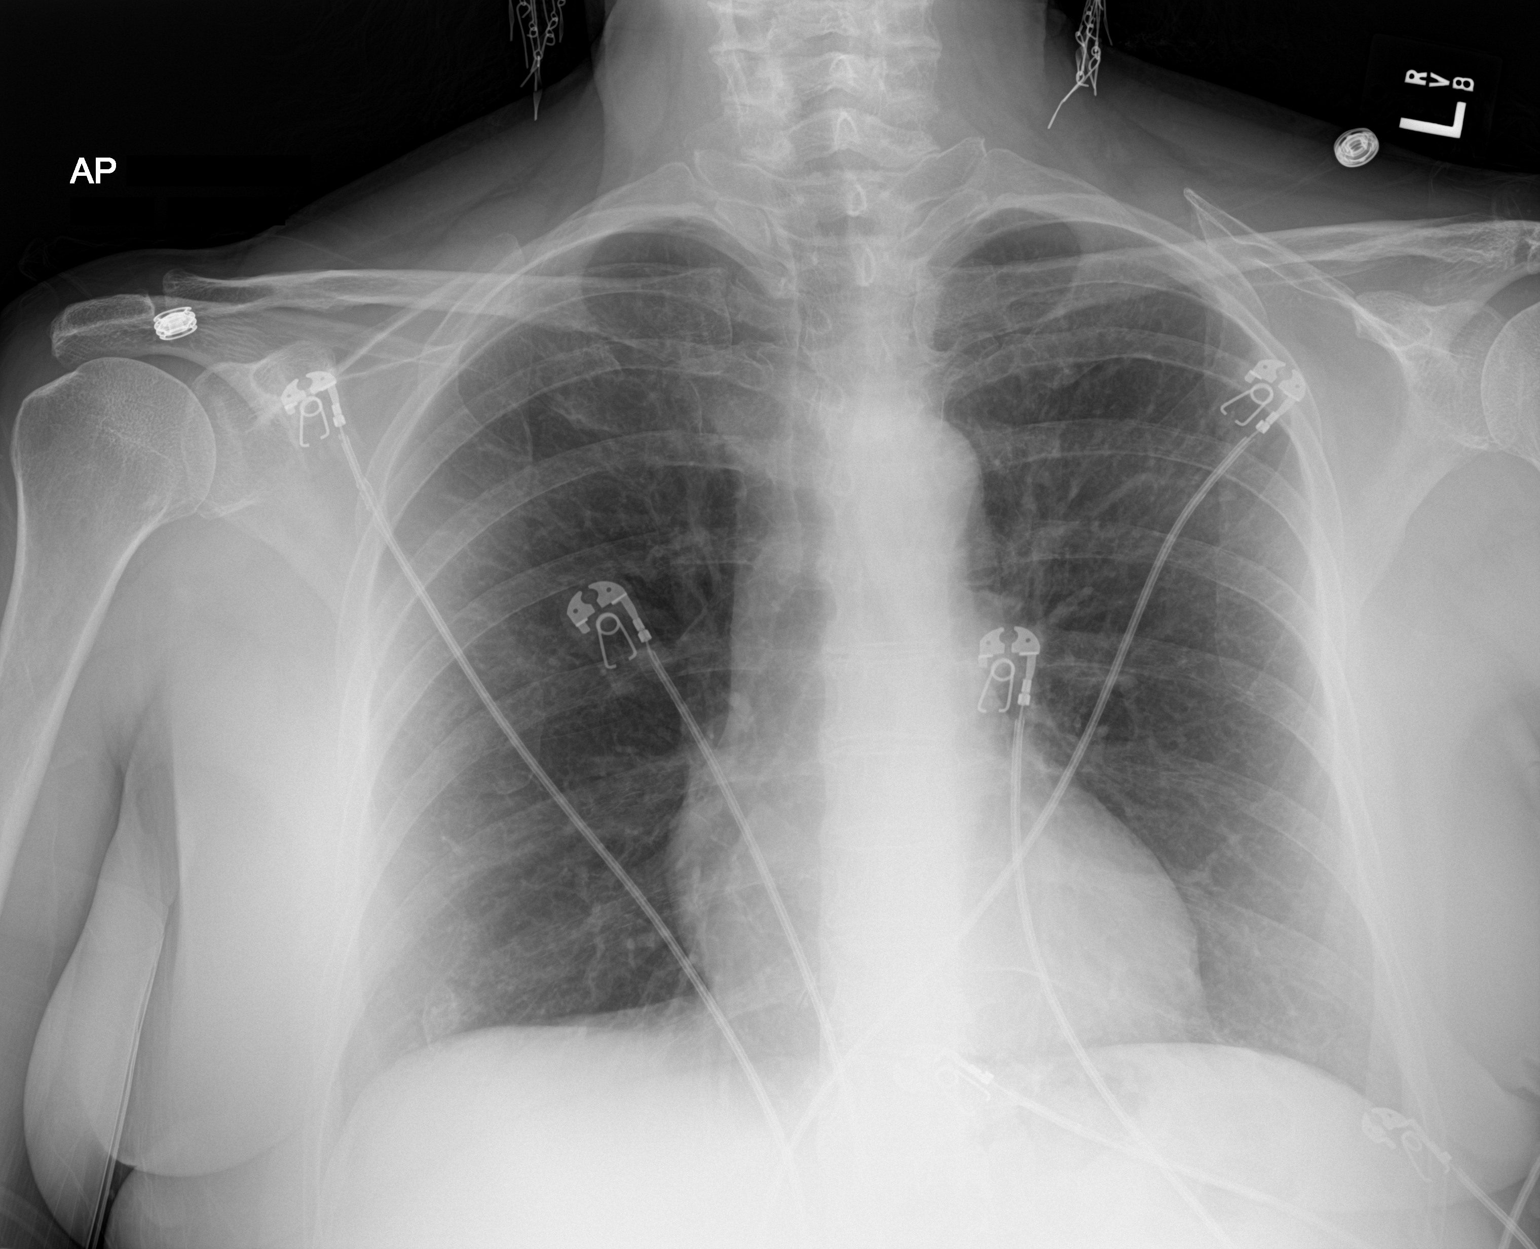

[1 of 1 positions shown; findings below may reference images not displayed]

FINDINGS: No edema or consolidation. Heart size and pulmonary vascularity are
normal. No adenopathy. No pneumothorax. No bone lesions.
IMPRESSION: No edema or consolidation.

## 2020-12-18 ENCOUNTER — Other Ambulatory Visit: Payer: Self-pay | Admitting: Family Medicine

## 2020-12-18 DIAGNOSIS — J4530 Mild persistent asthma, uncomplicated: Secondary | ICD-10-CM

## 2021-02-13 ENCOUNTER — Emergency Department (HOSPITAL_COMMUNITY)
Admission: EM | Admit: 2021-02-13 | Discharge: 2021-02-13 | Disposition: A | Discharge status: Home / Self Care | Payer: Medicare Other | Attending: Emergency Medicine | Admitting: Emergency Medicine

## 2021-02-13 ENCOUNTER — Other Ambulatory Visit: Payer: Self-pay

## 2021-02-13 ENCOUNTER — Emergency Department (HOSPITAL_COMMUNITY): Payer: Medicare Other

## 2021-02-13 ENCOUNTER — Encounter (HOSPITAL_COMMUNITY): Payer: Self-pay

## 2021-02-13 DIAGNOSIS — J449 Chronic obstructive pulmonary disease, unspecified: Secondary | ICD-10-CM | POA: Insufficient documentation

## 2021-02-13 DIAGNOSIS — R0789 Other chest pain: Secondary | ICD-10-CM | POA: Diagnosis not present

## 2021-02-13 DIAGNOSIS — R079 Chest pain, unspecified: Secondary | ICD-10-CM

## 2021-02-13 DIAGNOSIS — Z87891 Personal history of nicotine dependence: Secondary | ICD-10-CM | POA: Insufficient documentation

## 2021-02-13 DIAGNOSIS — J45909 Unspecified asthma, uncomplicated: Secondary | ICD-10-CM | POA: Insufficient documentation

## 2021-02-13 LAB — BASIC METABOLIC PANEL
Anion gap: 9 (ref 5–15)
BUN: 13 mg/dL (ref 6–20)
CO2: 24 mmol/L (ref 22–32)
Calcium: 9.9 mg/dL (ref 8.9–10.3)
Chloride: 106 mmol/L (ref 98–111)
Creatinine, Ser: 0.95 mg/dL (ref 0.44–1.00)
GFR, Estimated: >60 mL/min (ref >60)
Glucose, Bld: 98 mg/dL (ref 70–99)
Potassium: 4.1 mmol/L (ref 3.5–5.1)
Sodium: 139 mmol/L (ref 135–145)

## 2021-02-13 LAB — CBC
HCT: 45.2 % (ref 36.0–46.0)
Hemoglobin: 15.2 g/dL — ABNORMAL HIGH (ref 12.0–15.0)
MCH: 31.9 pg (ref 26.0–34.0)
MCHC: 33.6 g/dL (ref 30.0–36.0)
MCV: 94.8 fL (ref 80.0–100.0)
Platelets: 262 10*3/uL (ref 150–400)
RBC: 4.77 MIL/uL (ref 3.87–5.11)
RDW: 11.8 % (ref 11.5–15.5)
WBC: 6 10*3/uL (ref 4.0–10.5)
nRBC: 0 % (ref 0.0–0.2)

## 2021-02-13 LAB — TROPONIN I (HIGH SENSITIVITY)
Troponin I (High Sensitivity): 2 ng/L (ref <18)
Troponin I (High Sensitivity): 4 ng/L (ref <18)

## 2021-02-13 MED ORDER — ASPIRIN 81 MG PO CHEW
243.0000 mg | CHEWABLE_TABLET | Freq: Once | ORAL | Status: AC
  Administered 2021-02-13: 243 mg via ORAL

## 2021-02-13 MED ORDER — ASPIRIN 81 MG PO CHEW
324.0000 mg | CHEWABLE_TABLET | Freq: Once | ORAL | Status: DC
  Filled 2021-02-13: qty 4

## 2021-02-13 NOTE — ED Triage Notes (Signed)
Patient c/o left chest pain and left arm numbness since yesterday. Patient states she took a baby ASA at 0900 today. patient c/o SOB , but states she has COPD.

## 2021-02-13 NOTE — ED Notes (Signed)
Pt denies L arm numbness. Reports tingling.

## 2021-02-13 NOTE — Discharge Instructions (Signed)
If you develop recurrent, continued, or worsening chest pain, shortness of breath, fever, vomiting, abdominal or back pain, or any other new/concerning symptoms then return to the ER for evaluation.  

## 2021-02-13 NOTE — ED Provider Notes (Signed)
[facility] DEPT Provider Note   CSN: [id number] Arrival date & time: 02/13/21  1057     History Chief Complaint  Patient presents with  . Chest Pain  . arm numbness    Lori Owens is a 60 y.o. female.  HPI 60 year old female presents with chest pain.  It started 2 days ago and has been intermittent.  Seems to last hours at the time.  Typically taking a baby aspirin makes it go away.  Feels like a general soreness and sometimes a pressure.  Today about an hour before arrival she also notes her left arm was feeling a similar type pressure in her fingertips were tingly.  That seems to be better at this point.  She overall rates her left-sided chest pain is a 9 out of 10.  It is sore to the touch.  No shortness of breath, diaphoresis, vomiting or radiation.  No new back or abdominal pain.  No leg swelling.   Past Medical History:  Diagnosis Date  . Asthma   . COPD (chronic obstructive pulmonary disease) ([place])   . Depression 01/18/2013  . Headache(784.0)   . Heart murmur   . Melanoma ([place])   . Seasonal allergies   . Tobacco abuse 01/18/2013    Patient Active Problem List   Diagnosis Date Noted  . Circulation problem 04/09/2020  . Breast cancer screening by mammogram 01/24/2020  . Difficulty sleeping 01/24/2020  . Other abnormal glucose 01/24/2020  . Health maintenance examination 01/24/2020  . Anxiety 04/24/2016  . Exertional chest pain   . 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 Tobacco use disorder 05/07/2015  . Obesity 05/03/2015  . Excessive daytime sleepiness 03/03/2015  . Heart palpitations 12/24/2014  . Snoring 12/23/2014  . Witnessed apneic spells 12/23/2014  . Cardiomyopathy ([place]) 09/19/2014  . Mild persistent asthma 09/03/2014  . Asthma exacerbation 09/02/2014  . Suspicious mole 04/19/2013  . SOB (shortness of breath) 04/19/2013  . Hypokalemia 01/18/2013  . Dehydration 01/18/2013   . Tachycardia 01/18/2013    Past Surgical History:  Procedure Laterality Date  . ABDOMINAL HYSTERECTOMY    . NOSE SURGERY     polyp removal from nasal cavity   . THROAT SURGERY    . TONSILLECTOMY       OB History   No obstetric history on file.     Family History  Problem Relation Age of Onset  . Diabetes Maternal Grandfather   . Heart disease Maternal Grandfather   . Cancer Maternal Grandfather   . Hypertension Maternal Grandfather     Social History   Tobacco Use  . Smoking status: Former Smoker    Packs/day: 1.00    Years: 20.00    Pack years: 20.00    Types: Cigarettes  . Smokeless tobacco: Never Used  Vaping Use  . Vaping Use: Never used  Substance Use Topics  . Alcohol use: Yes    Alcohol/week: 0.0 standard drinks    Comment: Occasional  . Drug use: Yes    Types: Marijuana    Comment: Occasional    Home Medications Prior to Admission medications   Medication Sig Start Date End Date Taking? Authorizing Provider  albuterol (VENTOLIN HFA) 108 (90 Base) MCG/ACT inhaler TAKE 2 PUFFS BY MOUTH EVERY 6 HOURS AS NEEDED FOR WHEEZE OR SHORTNESS OF BREATH 12/18/20   Gifford Shave, MD  ALPRAZolam Duanne Moron) 0.5 MG tablet Take 0.5 mg by mouth 2 (two) times daily as needed for anxiety.  [provider]  azithromycin (ZITHROMAX Z-PAK) 250 MG tablet Take 500 mg on day 1, then 250 mg once a day for days 2, 3, 4, and 5. Patient not taking: Reported on 10/11/2018 12/19/17   Fatima Blank, MD  benzonatate (TESSALON) 100 MG capsule Take 1 capsule (100 mg total) by mouth every 8 (eight) hours. Patient not taking: Reported on 05/02/2019 11/24/18   Barrie Folk, PA-C  Biotin w/ Vitamins C & E (HAIR SKIN & NAILS GUMMIES) 1250-7.5-7.5 MCG-MG-UNT CHEW Chew 1 tablet by mouth daily.    [provider]  Cholecalciferol 5000 units capsule Take 5,000 Units by mouth daily.  05/30/15   [provider]  diphenhydrAMINE-APAP, sleep, (TYLENOL PM  EXTRA STRENGTH PO) Take 4 tablets by mouth at bedtime.    [provider]  doxycycline (VIBRAMYCIN) 100 MG capsule Take 1 capsule (100 mg total) by mouth 2 (two) times daily. One po bid x 7 days Patient not taking: Reported on 10/12/2019 05/25/19   Milton Ferguson, MD  fluticasone [facility]) 50 MCG/ACT nasal spray Place 2 sprays into both nostrils daily as needed for allergies or rhinitis. 01/24/20   Gifford Shave, MD  lidocaine (LIDODERM) 5 % Place 1 patch onto the skin daily. Remove & Discard patch within 12 hours or as directed by MD 10/12/19   Lorin Glass, PA-C  montelukast (SINGULAIR) 10 MG tablet TAKE 1 TABLET BY MOUTH EVERY DAY 03/27/20   Gifford Shave, MD  PARoxetine (PAXIL) 40 MG tablet Take 1 tablet (40 mg total) by mouth every morning. 01/24/20   Gifford Shave, MD  predniSONE (DELTASONE) 10 MG tablet Take 2 tablets (20 mg total) by mouth daily. Patient not taking: Reported on 10/12/2019 05/25/19   Milton Ferguson, MD  ranitidine (ZANTAC) 150 MG capsule Take 1 capsule (150 mg total) by mouth daily. Patient not taking: Reported on 05/02/2019 11/18/17   Fatima Blank, MD  SUPER B COMPLEX/C PO Take 1 tablet by mouth daily.    [provider]  zolpidem (AMBIEN) 5 MG tablet Take 1 tablet (5 mg total) by mouth at bedtime as needed for sleep. 04/13/20   Gifford Shave, MD    Allergies    Cymbalta [duloxetine hcl], Dust mite extract, Escitalopram oxalate, and Other  Review of Systems   Review of Systems  Constitutional: Negative for diaphoresis.  Respiratory: Negative for shortness of breath.   Cardiovascular: Positive for chest pain. Negative for leg swelling.  Gastrointestinal: Negative for abdominal pain and vomiting.  Musculoskeletal: Negative for back pain.  All other systems reviewed and are negative.   Physical Exam Updated Vital Signs BP 120/88   Pulse 68   Temp (!) 97.5 F (36.4 C) (Oral)   Resp 18   Ht 5\' 5"  (1.651 m)   Wt 77.1 kg    SpO2 94%   BMI 28.29 kg/m   Physical Exam Vitals and nursing note reviewed.  Constitutional:      General: She is not in acute distress.    Appearance: She is well-developed. She is not ill-appearing or diaphoretic.  HENT:     Head: Normocephalic and atraumatic.     Right Ear: External ear normal.     Left Ear: External ear normal.     Nose: Nose normal.  Eyes:     General:        Right eye: No discharge.        Left eye: No discharge.  Cardiovascular:     Rate and Rhythm:  Normal rate and regular rhythm.     Pulses:          Radial pulses are 2+ on the right side and 2+ on the left side.     Heart sounds: Normal heart sounds.  Pulmonary:     Effort: Pulmonary effort is normal.     Breath sounds: Normal breath sounds.  Chest:     Chest wall: Tenderness (left anterior chest) present.  Abdominal:     Palpations: Abdomen is soft.     Tenderness: There is no abdominal tenderness.  Musculoskeletal:     Right lower leg: No edema.     Left lower leg: No edema.  Skin:    General: Skin is warm and dry.  Neurological:     Mental Status: She is alert.  Psychiatric:        Mood and Affect: Mood is not anxious.     ED Results / Procedures / Treatments   Labs (all labs ordered are listed, but only abnormal results are displayed) Labs Reviewed  CBC - Abnormal; Notable for the following components:      Result Value   Hemoglobin 15.2 (*)    All other components within normal limits  BASIC METABOLIC PANEL  TROPONIN I (HIGH SENSITIVITY)  TROPONIN I (HIGH SENSITIVITY)    EKG EKG Interpretation  Date/Time:  Friday February 13 2021 11:06:42 EDT Ventricular Rate:  80 PR Interval:  131 QRS Duration: 86 QT Interval:  366 QTC Calculation: 423 R Axis:   39 Text Interpretation: Sinus rhythm Low voltage, precordial leads Abnormal inferior Q waves  similar to 2020 Confirmed by Sherwood Gambler [phone]) on 02/13/2021 11:52:15 AM   Radiology DG Chest 2 View  Result Date:  02/13/2021 CLINICAL DATA:  Chest pain EXAM: CHEST - 2 VIEW COMPARISON:  May 25, 2019 FINDINGS: Lungs are clear. Heart size and pulmonary vascularity are normal. No adenopathy. There is mild degenerative change in the thoracic spine. IMPRESSION: Lungs clear.  Cardiac silhouette normal. Electronically Signed   By: Lowella Grip III M.D.   On: 02/13/2021 11:40    Procedures Procedures   Medications Ordered in ED Medications  aspirin chewable tablet 243 mg (243 mg Oral Given 02/13/21 1225)    ED Course  I have reviewed the triage vital signs and the nursing notes.  Pertinent labs & imaging results that were available during my care of the patient were reviewed by me and considered in my medical decision making (see chart for details).    MDM Rules/Calculators/A&P                          Patient's chest pain is nonspecific.  It is reproducible and does get better with aspirin.  My suspicion this is ACS, PE, dissection is pretty low.  She reports resolution of her symptoms and I think is reasonable to have her follow-up as an outpatient with PCP with return precautions. Final Clinical Impression(s) / ED Diagnoses Final diagnoses:  Nonspecific chest pain    Rx / DC Orders ED Discharge Orders    None       Sherwood Gambler, MD 02/13/21 [phone]

## 2021-02-14 ENCOUNTER — Other Ambulatory Visit: Payer: Self-pay | Admitting: Family Medicine

## 2021-04-29 ENCOUNTER — Other Ambulatory Visit: Payer: Self-pay

## 2021-04-29 ENCOUNTER — Emergency Department (HOSPITAL_COMMUNITY): Payer: Medicare Other

## 2021-04-29 ENCOUNTER — Emergency Department (HOSPITAL_COMMUNITY)
Admission: EM | Admit: 2021-04-29 | Discharge: 2021-04-29 | Disposition: A | Discharge status: Home / Self Care | Payer: Medicare Other | Attending: Emergency Medicine | Admitting: Emergency Medicine

## 2021-04-29 ENCOUNTER — Encounter (HOSPITAL_COMMUNITY): Payer: Self-pay

## 2021-04-29 DIAGNOSIS — R059 Cough, unspecified: Secondary | ICD-10-CM | POA: Diagnosis present

## 2021-04-29 DIAGNOSIS — Z87891 Personal history of nicotine dependence: Secondary | ICD-10-CM | POA: Insufficient documentation

## 2021-04-29 DIAGNOSIS — J441 Chronic obstructive pulmonary disease with (acute) exacerbation: Secondary | ICD-10-CM | POA: Diagnosis not present

## 2021-04-29 DIAGNOSIS — J45909 Unspecified asthma, uncomplicated: Secondary | ICD-10-CM | POA: Insufficient documentation

## 2021-04-29 MED ORDER — IPRATROPIUM BROMIDE 0.02 % IN SOLN: 0.5000 mg | Freq: Four times a day (QID) | RESPIRATORY_TRACT | 12 refills | Status: DC

## 2021-04-29 MED ORDER — ALBUTEROL SULFATE (2.5 MG/3ML) 0.083% IN NEBU
5.0000 mg | INHALATION_SOLUTION | Freq: Once | RESPIRATORY_TRACT | Status: AC
  Administered 2021-04-29: 5 mg via RESPIRATORY_TRACT
  Filled 2021-04-29: qty 6

## 2021-04-29 MED ORDER — PREDNISONE 10 MG PO TABS: 20.0000 mg | ORAL_TABLET | Freq: Every day | ORAL | 0 refills | Status: DC

## 2021-04-29 MED ORDER — IPRATROPIUM BROMIDE 0.02 % IN SOLN
0.5000 mg | Freq: Once | RESPIRATORY_TRACT | Status: AC
  Administered 2021-04-29: 0.5 mg via RESPIRATORY_TRACT
  Filled 2021-04-29: qty 2.5

## 2021-04-29 MED ORDER — ALBUTEROL SULFATE (5 MG/ML) 0.5% IN NEBU: 2.5000 mg | INHALATION_SOLUTION | Freq: Four times a day (QID) | RESPIRATORY_TRACT | 12 refills | Status: DC | PRN

## 2021-04-29 MED ORDER — PREDNISONE 20 MG PO TABS
60.0000 mg | ORAL_TABLET | Freq: Once | ORAL | Status: AC
  Administered 2021-04-29: 60 mg via ORAL

## 2021-04-29 MED ORDER — IPRATROPIUM-ALBUTEROL 0.5-2.5 (3) MG/3ML IN SOLN
3.0000 mL | Freq: Once | RESPIRATORY_TRACT | Status: AC
  Administered 2021-04-29: 3 mL via RESPIRATORY_TRACT
  Filled 2021-04-29: qty 3

## 2021-04-29 NOTE — Discharge Instructions (Addendum)
Return for any problem.  ?

## 2021-04-29 NOTE — ED Triage Notes (Signed)
Pt complains of being short of breath and a COPD exacerbation that she has been treated for but not getting any better

## 2021-04-29 NOTE — ED Provider Notes (Signed)
[facility] DEPT Provider Note   CSN: [id number] Arrival date & time: 04/29/21  9485     History Complaint: COPD exacerbation  Lori Owens is a 60 y.o. female.  The history is provided by the patient.  She has history of COPD and states that she had worsening of her usual dyspnea 2 days ago.  She started on a prednisone taper at that point and took 40 mg of prednisone 2 days ago and 30 mg of prednisone yesterday.  She has a minimal cough which is not changed from baseline.  She denies fever or chills.  Denies any chest pain.  She denies any sick contacts.  She has been vaccinated against COVID-19 including a booster, but continues to smoke.  She has used her home nebulizer without benefit.  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   Tobacco use disorder 05/07/2015   Obesity 05/03/2015   Excessive daytime sleepiness 03/03/2015   Heart palpitations 12/24/2014   Snoring 12/23/2014   Witnessed apneic spells 12/23/2014   Cardiomyopathy ([place]) 09/19/2014   Mild persistent asthma 09/03/2014   Asthma exacerbation 09/02/2014   Suspicious mole 04/19/2013   SOB (shortness of breath) 04/19/2013   Hypokalemia 01/18/2013   Dehydration 01/18/2013   Tachycardia 01/18/2013    Past Surgical History:  Procedure Laterality Date   ABDOMINAL HYSTERECTOMY     NOSE SURGERY     polyp removal  from nasal cavity    THROAT SURGERY     TONSILLECTOMY       OB History   No obstetric history on file.     Family History  Problem Relation Age of Onset   Diabetes Maternal Grandfather    Heart disease Maternal Grandfather    Cancer Maternal Grandfather    Hypertension Maternal Grandfather     Social History   Tobacco Use   Smoking status: Former    Packs/day: 1.00    Years: 20.00    Pack years: 20.00    Types: Cigarettes   Smokeless tobacco: Never  Vaping Use   Vaping Use: Never used  Substance Use Topics   Alcohol use: Yes    Alcohol/week: 0.0 standard drinks    Comment: Occasional   Drug use: Yes    Types: Marijuana    Comment: Occasional    Home Medications Prior to Admission medications   Medication Sig Start Date End Date Taking? Authorizing Provider  albuterol (PROVENTIL) (2.5 MG/3ML) 0.083% nebulizer solution TAKE 3 MLS BY NEBULIZATION EVERY 6 HOURS AS NEEDED FOR WHEEZING OR SHORTNESS OF BREATH. 02/16/21   Gifford Shave, MD  albuterol (VENTOLIN HFA) 108 (90 Base) MCG/ACT inhaler TAKE 2 PUFFS BY MOUTH EVERY 6 HOURS AS NEEDED FOR WHEEZE OR SHORTNESS OF BREATH 12/18/20   Gifford Shave, MD  ALPRAZolam Duanne Moron) 0.5 MG tablet Take 0.5 mg by mouth 2 (two) times daily as needed for anxiety.  [provider]  azithromycin (ZITHROMAX Z-PAK) 250 MG tablet Take 500 mg on day 1, then 250 mg once a day for days 2, 3, 4, and 5. Patient not taking: Reported on 10/11/2018 12/19/17   Fatima Blank, MD  benzonatate (TESSALON) 100 MG capsule Take 1 capsule (100 mg total) by mouth every 8 (eight) hours. Patient not taking: Reported on 05/02/2019 11/24/18   Barrie Folk, PA-C  Biotin w/ Vitamins C & E (HAIR SKIN & NAILS GUMMIES) 1250-7.5-7.5 MCG-MG-UNT CHEW Chew 1 tablet by mouth daily.    [provider]  Cholecalciferol 5000 units capsule Take 5,000 Units by mouth daily.  05/30/15   [provider]  diphenhydrAMINE-APAP, sleep,  (TYLENOL PM EXTRA STRENGTH PO) Take 4 tablets by mouth at bedtime.    [provider]  doxycycline (VIBRAMYCIN) 100 MG capsule Take 1 capsule (100 mg total) by mouth 2 (two) times daily. One po bid x 7 days Patient not taking: Reported on 10/12/2019 05/25/19   Milton Ferguson, MD  fluticasone [facility]) 50 MCG/ACT nasal spray Place 2 sprays into both nostrils daily as needed for allergies or rhinitis. 01/24/20   Gifford Shave, MD  lidocaine (LIDODERM) 5 % Place 1 patch onto the skin daily. Remove & Discard patch within 12 hours or as directed by MD 10/12/19   Lorin Glass, PA-C  montelukast (SINGULAIR) 10 MG tablet TAKE 1 TABLET BY MOUTH EVERY DAY 03/27/20   Gifford Shave, MD  PARoxetine (PAXIL) 40 MG tablet Take 1 tablet (40 mg total) by mouth every morning. 01/24/20   Gifford Shave, MD  predniSONE (DELTASONE) 10 MG tablet Take 2 tablets (20 mg total) by mouth daily. Patient not taking: Reported on 10/12/2019 05/25/19   Milton Ferguson, MD  ranitidine (ZANTAC) 150 MG capsule Take 1 capsule (150 mg total) by mouth daily. Patient not taking: Reported on 05/02/2019 11/18/17   Fatima Blank, MD  SUPER B COMPLEX/C PO Take 1 tablet by mouth daily.    [provider]  zolpidem (AMBIEN) 5 MG tablet Take 1 tablet (5 mg total) by mouth at bedtime as needed for sleep. 04/13/20   Gifford Shave, MD    Allergies    Cymbalta [duloxetine hcl], Dust mite extract, Escitalopram oxalate, and Other  Review of Systems   Review of Systems  All other systems reviewed and are negative.  Physical Exam Updated Vital Signs BP 133/88   Pulse 88   Temp 98 F (36.7 C) (Oral)   Resp 16   Ht 5\' 5"  (1.651 m)   Wt 77.1 kg   SpO2 94%   BMI 28.29 kg/m   Physical Exam Vitals and nursing note reviewed.  60 year old female, resting comfortably and in no acute distress. Vital signs are normal. Oxygen saturation is 94%, which is normal. Head is normocephalic and atraumatic. PERRLA, EOMI.  Oropharynx is clear. Neck is nontender and supple without adenopathy or JVD. Back is nontender and there is no CVA tenderness. Lungs have mild to moderate expiratory wheezing diffusely.  There are no rales or rhonchi. Chest is nontender. Heart has regular rate and rhythm without murmur. Abdomen is soft, flat, nontender without masses or hepatosplenomegaly and peristalsis is normoactive. Extremities have no cyanosis or edema, full range of motion is present. Skin is warm and dry without rash. Neurologic: Mental status is normal, cranial nerves are intact, there are no motor or sensory deficits.  ED Results / Procedures / Treatments    EKG EKG Interpretation  Date/Time:  Wednesday April 29 2021 02:30:49 EDT Ventricular Rate:  82 PR Interval:  142 QRS Duration: 94 QT Interval:  360 QTC Calculation: 421 R Axis:   32 Text Interpretation: Sinus rhythm Low voltage, precordial leads When compared with ECG of 02/13/2021, No significant change was found Confirmed by Delora Fuel ([id number]) on 04/29/2021 2:38:33 AM  Radiology DG Chest Port 1 View  Result Date: 04/29/2021 CLINICAL DATA:  Shortness of breath.  COPD exacerbation. EXAM: PORTABLE CHEST 1 VIEW COMPARISON:  Chest x-ray 02/13/2021. FINDINGS: Mediastinum and hilar structures normal. Heart size normal. Low lung volumes. No focal infiltrate. No pleural effusion or pneumothorax. IMPRESSION: Low lung volumes.  No focal infiltrate noted. Electronically Signed   By: Marcello Moores  Register   On: 04/29/2021 06:59    Procedures Procedures   Medications Ordered in ED Medications  predniSONE (DELTASONE) tablet 60 mg (has no administration in time range)  ipratropium-albuterol (DUONEB) 0.5-2.5 (3) MG/3ML nebulizer solution 3 mL (has no administration in time range)    ED Course  I have reviewed the triage vital signs and the nursing notes.  Pertinent imaging results that were available during my care of the patient were reviewed by me and considered in  my medical decision making (see chart for details).   MDM Rules/Calculators/A&P                         COPD exacerbation.  ECG showed no acute changes.  Will check chest x-ray to rule out pneumonia.  Old records are reviewed confirming occasional ED visits for COPD exacerbation, no hospitalizations for COPD since 2015.  She is given a dose of prednisone and will be given albuterol and ipratropium via nebulizer.  There was modest improvement with above-noted treatment.  She will be given a second albuterol and ipratropium nebulizer treatment.  Chest x-ray shows no evidence of pneumonia.  Case is signed out to Dr. Francia Greaves.  Final Clinical Impression(s) / ED Diagnoses Final diagnoses:  COPD with acute exacerbation [facility])    Rx / DC Orders ED Discharge Orders     None        Delora Fuel, MD 60/45/40 [phone]

## 2021-04-29 NOTE — ED Notes (Signed)
Brought pt snack

## 2021-04-29 NOTE — ED Provider Notes (Signed)
Seen after prior ED provider.  Patient is improved.  Mild diffuse minimal expiratory wheezing present on repeat exam.  Patient is speaking in full sentences.  She is comfortable.  Patient reports that she feels improved and desires discharge home.  She does understand need for close follow-up.  Strict return precautions given and understood.     Valarie Merino, MD 04/29/21 [phone]

## 2021-05-26 ENCOUNTER — Ambulatory Visit (HOSPITAL_COMMUNITY)
Admission: EM | Admit: 2021-05-26 | Discharge: 2021-05-27 | Disposition: A | Discharge status: Home / Self Care | Payer: Medicaid Other | Attending: Psychiatry | Admitting: Psychiatry

## 2021-05-26 ENCOUNTER — Emergency Department (HOSPITAL_COMMUNITY)
Admission: EM | Admit: 2021-05-26 | Discharge: 2021-05-26 | Payer: Medicare Other | Attending: Emergency Medicine | Admitting: Emergency Medicine

## 2021-05-26 ENCOUNTER — Emergency Department (HOSPITAL_COMMUNITY): Payer: Medicare Other

## 2021-05-26 ENCOUNTER — Other Ambulatory Visit: Payer: Self-pay

## 2021-05-26 ENCOUNTER — Encounter (HOSPITAL_COMMUNITY): Payer: Self-pay | Admitting: Emergency Medicine

## 2021-05-26 DIAGNOSIS — R45851 Suicidal ideations: Secondary | ICD-10-CM | POA: Insufficient documentation

## 2021-05-26 DIAGNOSIS — Z79899 Other long term (current) drug therapy: Secondary | ICD-10-CM | POA: Insufficient documentation

## 2021-05-26 DIAGNOSIS — R059 Cough, unspecified: Secondary | ICD-10-CM | POA: Insufficient documentation

## 2021-05-26 DIAGNOSIS — Z5900 Homelessness unspecified: Secondary | ICD-10-CM | POA: Insufficient documentation

## 2021-05-26 DIAGNOSIS — F332 Major depressive disorder, recurrent severe without psychotic features: Secondary | ICD-10-CM | POA: Insufficient documentation

## 2021-05-26 DIAGNOSIS — J449 Chronic obstructive pulmonary disease, unspecified: Secondary | ICD-10-CM | POA: Insufficient documentation

## 2021-05-26 DIAGNOSIS — J45909 Unspecified asthma, uncomplicated: Secondary | ICD-10-CM | POA: Diagnosis not present

## 2021-05-26 DIAGNOSIS — Z7951 Long term (current) use of inhaled steroids: Secondary | ICD-10-CM | POA: Diagnosis not present

## 2021-05-26 DIAGNOSIS — Z87891 Personal history of nicotine dependence: Secondary | ICD-10-CM | POA: Insufficient documentation

## 2021-05-26 DIAGNOSIS — F1290 Cannabis use, unspecified, uncomplicated: Secondary | ICD-10-CM | POA: Insufficient documentation

## 2021-05-26 DIAGNOSIS — Z20822 Contact with and (suspected) exposure to covid-19: Secondary | ICD-10-CM | POA: Insufficient documentation

## 2021-05-26 LAB — COMPREHENSIVE METABOLIC PANEL
ALT: 20 U/L (ref 0–44)
AST: 21 U/L (ref 15–41)
Albumin: 4.3 g/dL (ref 3.5–5.0)
Alkaline Phosphatase: 66 U/L (ref 38–126)
Anion gap: 10 (ref 5–15)
BUN: 10 mg/dL (ref 6–20)
CO2: 24 mmol/L (ref 22–32)
Calcium: 9.8 mg/dL (ref 8.9–10.3)
Chloride: 108 mmol/L (ref 98–111)
Creatinine, Ser: 0.75 mg/dL (ref 0.44–1.00)
GFR, Estimated: >60 mL/min (ref >60)
Glucose, Bld: 88 mg/dL (ref 70–99)
Potassium: 3.8 mmol/L (ref 3.5–5.1)
Sodium: 142 mmol/L (ref 135–145)
Total Bilirubin: 0.7 mg/dL (ref 0.3–1.2)
Total Protein: 7.1 g/dL (ref 6.5–8.1)

## 2021-05-26 LAB — CBC WITH DIFFERENTIAL/PLATELET
Abs Immature Granulocytes: 0.02 10*3/uL (ref 0.00–0.07)
Basophils Absolute: 0 10*3/uL (ref 0.0–0.1)
Basophils Relative: 1 %
Eosinophils Absolute: 0.2 10*3/uL (ref 0.0–0.5)
Eosinophils Relative: 4 %
HCT: 39.1 % (ref 36.0–46.0)
Hemoglobin: 13.3 g/dL (ref 12.0–15.0)
Immature Granulocytes: 0 %
Lymphocytes Relative: 22 %
Lymphs Abs: 1.3 10*3/uL (ref 0.7–4.0)
MCH: 32.9 pg (ref 26.0–34.0)
MCHC: 34 g/dL (ref 30.0–36.0)
MCV: 96.8 fL (ref 80.0–100.0)
Monocytes Absolute: 0.4 10*3/uL (ref 0.1–1.0)
Monocytes Relative: 6 %
Neutro Abs: 3.9 10*3/uL (ref 1.7–7.7)
Neutrophils Relative %: 67 %
Platelets: 258 10*3/uL (ref 150–400)
RBC: 4.04 MIL/uL (ref 3.87–5.11)
RDW: 14.5 % (ref 11.5–15.5)
WBC: 5.8 10*3/uL (ref 4.0–10.5)
nRBC: 0 % (ref 0.0–0.2)

## 2021-05-26 LAB — ACETAMINOPHEN LEVEL: Acetaminophen (Tylenol), Serum: <10 ug/mL — ABNORMAL LOW (ref 10–30)

## 2021-05-26 LAB — RAPID URINE DRUG SCREEN, HOSP PERFORMED
Amphetamines: NOT DETECTED
Barbiturates: NOT DETECTED
Benzodiazepines: NOT DETECTED
Cocaine: NOT DETECTED
Opiates: NOT DETECTED
Tetrahydrocannabinol: POSITIVE — AB

## 2021-05-26 LAB — SALICYLATE LEVEL: Salicylate Lvl: <7 mg/dL — ABNORMAL LOW (ref 7.0–30.0)

## 2021-05-26 LAB — I-STAT BETA HCG BLOOD, ED (MC, WL, AP ONLY): I-stat hCG, quantitative: 42 m[IU]/mL — ABNORMAL HIGH (ref <5)

## 2021-05-26 LAB — RESP PANEL BY RT-PCR (FLU A&B, COVID) ARPGX2
Influenza A by PCR: NEGATIVE
Influenza B by PCR: NEGATIVE
SARS Coronavirus 2 by RT PCR: NEGATIVE

## 2021-05-26 LAB — ETHANOL: Alcohol, Ethyl (B): <10 mg/dL (ref <10)

## 2021-05-26 MED ORDER — FLUTICASONE PROPIONATE 50 MCG/ACT NA SUSP
2.0000 | Freq: Every day | NASAL | Status: DC | PRN
  Administered 2021-05-26: 2 via NASAL
  Filled 2021-05-26: qty 16

## 2021-05-26 MED ORDER — MONTELUKAST SODIUM 10 MG PO TABS
10.0000 mg | ORAL_TABLET | Freq: Every day | ORAL | Status: DC
  Administered 2021-05-26 – 2021-05-27 (×2): 10 mg via ORAL
  Filled 2021-05-26 (×2): qty 1

## 2021-05-26 MED ORDER — NICOTINE 21 MG/24HR TD PT24
21.0000 mg | MEDICATED_PATCH | Freq: Every day | TRANSDERMAL | Status: DC
Start: 2021-05-26 — End: 2021-05-27
  Administered 2021-05-26 – 2021-05-27 (×2): 21 mg via TRANSDERMAL
  Filled 2021-05-26 (×2): qty 1

## 2021-05-26 MED ORDER — ONDANSETRON 4 MG PO TBDP
4.0000 mg | ORAL_TABLET | Freq: Four times a day (QID) | ORAL | Status: DC | PRN
  Administered 2021-05-26 – 2021-05-27 (×2): 4 mg via ORAL
  Filled 2021-05-26 (×3): qty 1

## 2021-05-26 MED ORDER — ALUM & MAG HYDROXIDE-SIMETH 200-200-20 MG/5ML PO SUSP: 30.0000 mL | ORAL | Status: DC | PRN

## 2021-05-26 MED ORDER — ACETAMINOPHEN 325 MG PO TABS
650.0000 mg | ORAL_TABLET | Freq: Four times a day (QID) | ORAL | Status: DC | PRN
Start: 2021-05-26 — End: 2021-05-27
  Administered 2021-05-27: 650 mg via ORAL
  Filled 2021-05-26: qty 2

## 2021-05-26 MED ORDER — ONDANSETRON 4 MG PO TBDP
4.0000 mg | ORAL_TABLET | Freq: Three times a day (TID) | ORAL | Status: DC | PRN
  Administered 2021-05-26: 4 mg via ORAL
  Filled 2021-05-26: qty 1

## 2021-05-26 MED ORDER — ALBUTEROL SULFATE HFA 108 (90 BASE) MCG/ACT IN AERS
2.0000 | INHALATION_SPRAY | Freq: Four times a day (QID) | RESPIRATORY_TRACT | Status: DC | PRN
  Administered 2021-05-26 – 2021-05-27 (×3): 2 via RESPIRATORY_TRACT
  Filled 2021-05-26 (×2): qty 6.7

## 2021-05-26 MED ORDER — MAGNESIUM HYDROXIDE 400 MG/5ML PO SUSP: 30.0000 mL | Freq: Every day | ORAL | Status: DC | PRN

## 2021-05-26 MED ORDER — PAROXETINE HCL 20 MG PO TABS
40.0000 mg | ORAL_TABLET | ORAL | Status: DC
  Administered 2021-05-26 – 2021-05-27 (×2): 40 mg via ORAL
  Filled 2021-05-26 (×3): qty 2

## 2021-05-26 MED ORDER — HYDROXYZINE HCL 25 MG PO TABS
25.0000 mg | ORAL_TABLET | Freq: Three times a day (TID) | ORAL | Status: DC | PRN
  Administered 2021-05-26: 25 mg via ORAL
  Filled 2021-05-26: qty 1

## 2021-05-26 MED ORDER — TRAZODONE HCL 50 MG PO TABS: 50.0000 mg | ORAL_TABLET | Freq: Every evening | ORAL | Status: DC | PRN

## 2021-05-26 NOTE — BH Assessment (Signed)
[facility] Assessment Progress Note   Per Shuvon Rankin, NP, pt is to be transferred to the [facility].  Please call report to [phone].  Pt is to be transported via [facility].  EDP Carmin Muskrat, MD and pt's nurse, [name], have been notified.   Jalene Mullet, [place] Coordinator [phone]

## 2021-05-26 NOTE — ED Notes (Signed)
Called report to Mickel Baas at [facility] and called safe transport to get pt.

## 2021-05-26 NOTE — ED Provider Notes (Signed)
Behavioral Health Admission [facility] & OBS)  Date: 05/26/21 Patient Name: Lori Owens MRN: [id number] Chief Complaint:  Chief Complaint  Patient presents with   urgent emergent eval      Diagnoses:  Final diagnoses:  MDD (major depressive disorder), recurrent severe, without psychosis ([place])   Lori Owens, 60 yr old female patient presented to [facility] as a transfer from [facility] ED with complaints of suicidal ideations.  Lori Owens, 60 y.o., female patient seen face to face by this provider, consulted with Dr. Serafina Mitchell; and chart reviewed on 05/26/21.    HPI:   During evaluation Lori Owens is in sitting position in no acute distress.  She s alert, oriented x 4, calm, cooperative and attentive.  Her mood is depressed with congruent affect.  She has normal speech, and behavior.  Objectively there is no evidence of psychosis/mania or delusional thinking.  Patient is able to converse coherently, goal directed thoughts, no distractibility, or pre-occupation.  She also denies paranoia.  Endorses homicidal ideations, mainly towards people who have hurt her. States, "I want to jump on people, just grab them, and shake them".  Endorses suicidal ideations.  Denies having any plan, intent, or access to means.  Patient states she cannot contract for safety at this time.  States she would like to be restarted on Paxil.  States her spouse made her throw her medications away, because he thought marijuana was a better option.  Patient endorses auditory hallucinations.  States, "it is my own voice, it is hard to explain I bounce ideas and talk to myself".  Denies visual hallucinations  Patient states technically she is homeless, because she left her spouse and her home.  Patient states her plan upon discharge is to go live with her son for a few weeks.  States that she can live with her daughter for 7 days out of the month.  Patient denies alcohol use, endorses marijuana use 1 bowl, every night at 4pm.  Last use was yesterday, 05/25/2021.  Patient agrees to be admitted to overnight assessment and to be reevaluated in the a.m.    PHQ 2-9:  [place] Visit from 01/24/2020 in [place] Office Visit from 02/04/2015 in [place]  Thoughts that you would be better off dead, or of hurting yourself in some way Not at all Not at all  PHQ-9 Total Score 11 [place] ED from 05/26/2021 in [place] DEPT Most recent reading at 05/26/2021 10:56 AM ED from 05/26/2021 in [facility] Most recent reading at 05/26/2021 10:55 AM ED from 04/29/2021 in [place] DEPT Most recent reading at 04/29/2021  2:53 AM  C-SSRS RISK CATEGORY Low Risk Error: Q7 should not be populated when Q6 is No Error: Question 2 not populated        Total Time spent with patient: 10-minute  Musculoskeletal  Strength & Muscle Tone: within normal limits Gait & Station: normal Patient leans: N/A  Psychiatric Specialty Exam  Presentation General Appearance: Appropriate for Environment  Eye Contact:Good  Speech:Clear and Coherent; Normal Rate  Speech Volume:Normal  Handedness:Right   Mood and Affect  Mood:Anxious; Depressed  Affect:Congruent   Thought Process  Thought Processes:Coherent  Descriptions of Associations:Intact  Orientation:Full (Time, Place and Person)  Thought Content:Logical  Diagnosis of Schizophrenia or Schizoaffective disorder in past: No  Duration of Psychotic Symptoms: Greater than six months  Hallucinations:Hallucinations: Auditory Description of Auditory Hallucinations: states she hears her own voice and she talks things through with herself  Ideas of Reference:None  Suicidal Thoughts:Suicidal Thoughts: Yes, Passive SI Passive Intent and/or Plan: Without Intent; Without Plan; Without Means to Carry Out  Homicidal  Thoughts:Homicidal Thoughts: Yes, Passive HI Passive Intent and/or Plan: Without Intent; Without Plan; Without Means to Carry Out   Sensorium  Memory:Immediate Good; Recent Good; Remote Good  Judgment:Fair  Insight:Fair   Executive Functions  Concentration:Good  Attention Span:Good  Recall:Good  Fund of Knowledge:Good  Language:Good   Psychomotor Activity  Psychomotor Activity:Psychomotor Activity: Normal   Assets  Assets:Communication Skills; Desire for Improvement; Physical Health; Resilience; Social Support; Leisure Time   Sleep  Sleep:Sleep: Fair Number of Hours of Sleep: 5   No data recorded  Physical Exam Vitals and nursing note reviewed.  Constitutional:      General: She is not in acute distress.    Appearance: Normal appearance. She is not ill-appearing.  HENT:     Head: Normocephalic.  Eyes:     Pupils: Pupils are equal, round, and reactive to light.  Cardiovascular:     Rate and Rhythm: Normal rate.  Pulmonary:     Effort: Pulmonary effort is normal. No respiratory distress.  Musculoskeletal:        General: Normal range of motion.     Cervical back: Normal range of motion.  Skin:    General: Skin is warm and dry.  Neurological:     Mental Status: She is alert and oriented to person, place, and time.  Psychiatric:        Attention and Perception: Attention normal. She perceives auditory hallucinations.        Mood and Affect: Mood is anxious and depressed.        Speech: Speech normal.        Behavior: Behavior normal. Behavior is cooperative.        Thought Content: Thought content includes homicidal and suicidal ideation. Thought content does not include homicidal or suicidal plan.        Cognition and Memory: Cognition normal.        Judgment: Judgment is impulsive.   Review of Systems  Constitutional: Negative.  Negative for chills and fever.  HENT: Negative.  Negative for hearing loss.   Eyes: Negative.   Respiratory: Negative.   Negative for cough.   Cardiovascular: Negative.   Skin: Negative.   Neurological: Negative.   Psychiatric/Behavioral:  Positive for depression, hallucinations and suicidal ideas. The patient is nervous/anxious.    Blood pressure 117/84, pulse (!) 101, temperature 98.5 F (36.9 C), temperature source Oral, resp. rate 16, SpO2 98 %. There is no height or weight on file to calculate BMI.  Past Psychiatric History:  Major Depressive Disorder, Recurrent, Severe without psychotic features and Substance use disorder  Is the patient at risk to self? Yes  Has the patient been a risk to self in the past 6 months? Yes .    Has the patient been a risk to self within the distant past? No   Is the patient a risk to others? No   Has the patient been a risk to others in the past 6 months? No   Has the patient been a risk to others within the distant past? No   Past Medical History:  Past Medical History:  Diagnosis Date   Asthma    COPD (chronic obstructive pulmonary disease) ([place])    Depression 01/18/2013  Headache(784.0)    Heart murmur    Melanoma ([place])    Seasonal allergies    Tobacco abuse 01/18/2013    Past Surgical History:  Procedure Laterality Date   ABDOMINAL HYSTERECTOMY     NOSE SURGERY     polyp removal from nasal cavity    THROAT SURGERY     TONSILLECTOMY      Family History:  Family History  Problem Relation Age of Onset   Diabetes Maternal Grandfather    Heart disease Maternal Grandfather    Cancer Maternal Grandfather    Hypertension Maternal Grandfather     Social History:  Social History   Socioeconomic History   Marital status: Single    Spouse name: Not on file   Number of children: Not on file   Years of education: Not on file   Highest education level: Not on file  Occupational History   Not on file  Tobacco Use   Smoking status: Former    Packs/day: 1.00    Years: 20.00    Pack years: 20.00    Types: Cigarettes   Smokeless tobacco: Never   Vaping Use   Vaping Use: Never used  Substance and Sexual Activity   Alcohol use: Yes    Alcohol/week: 0.0 standard drinks    Comment: Occasional   Drug use: Yes    Types: Marijuana    Comment: Occasional   Sexual activity: Yes    Partners: Male    Birth control/protection: Condom  Other Topics Concern   Not on file  Social History Narrative   ** Merged History Encounter **   Lives with mother in a one story home.  Has 2 children.  Works as a Electrical engineer.    Education: some college.       Social Determinants of Health   Financial Resource Strain: Not on file  Food Insecurity: Not on file  Transportation Needs: Not on file  Physical Activity: Not on file  Stress: Not on file  Social Connections: Not on file  Intimate Partner Violence: Not on file    SDOH:  SDOH Screenings   Alcohol Screen: Not on file  Depression (PHQ2-9): Not on file  Financial Resource Strain: Not on file  Food Insecurity: Not on file  Housing: Not on file  Physical Activity: Not on file  Social Connections: Not on file  Stress: Not on file  Tobacco Use: Medium Risk   Smoking Tobacco Use: Former   Smokeless Tobacco Use: Never  Transportation Needs: Not on file    Last Labs:  Admission on 05/26/2021, Discharged on 05/26/2021  Component Date Value Ref Range Status   SARS Coronavirus 2 by RT PCR 05/26/2021 NEGATIVE  NEGATIVE Final   Comment: (NOTE) SARS-CoV-2 target nucleic acids are NOT DETECTED.  The SARS-CoV-2 RNA is generally detectable in upper respiratory specimens during the acute phase of infection. The lowest concentration of SARS-CoV-2 viral copies this assay can detect is 138 copies/mL. A negative result does not preclude SARS-Cov-2 infection and should not be used as the sole basis for treatment or other patient management decisions. A negative result may occur with  improper specimen collection/handling, submission of specimen other than nasopharyngeal swab, presence of  viral mutation(s) within the areas targeted by this assay, and inadequate number of viral copies(<138 copies/mL). A negative result must be combined with clinical observations, patient history, and epidemiological information. The expected result is Negative.  Fact Sheet for Patients:  EntrepreneurPulse.com.au  Fact Sheet for Healthcare Providers:  IncredibleEmployment.be  This test is no                          t yet approved or cleared by the [place] and  has been authorized for detection and/or diagnosis of SARS-CoV-2 by FDA under an Emergency Use Authorization (EUA). This EUA will remain  in effect (meaning this test can be used) for the duration of the COVID-19 declaration under Section 564(b)(1) of the Act, 21 U.S.C.section 360bbb-3(b)(1), unless the authorization is terminated  or revoked sooner.       Influenza A by PCR 05/26/2021 NEGATIVE  NEGATIVE Final   Influenza B by PCR 05/26/2021 NEGATIVE  NEGATIVE Final   Comment: (NOTE) The Xpert Xpress SARS-CoV-2/FLU/RSV plus assay is intended as an aid in the diagnosis of influenza from Nasopharyngeal swab specimens and should not be used as a sole basis for treatment. Nasal washings and aspirates are unacceptable for Xpert Xpress SARS-CoV-2/FLU/RSV testing.  Fact Sheet for Patients: EntrepreneurPulse.com.au  Fact Sheet for Healthcare Providers: IncredibleEmployment.be  This test is not yet approved or cleared by the [place] FDA and has been authorized for detection and/or diagnosis of SARS-CoV-2 by FDA under an Emergency Use Authorization (EUA). This EUA will remain in effect (meaning this test can be used) for the duration of the COVID-19 declaration under Section 564(b)(1) of the Act, 21 U.S.C. section 360bbb-3(b)(1), unless the authorization is terminated or revoked.  Performed at [facility], [place] [address], [place], [place] [address]    Sodium 05/26/2021 142  135 - 145 mmol/L Final   Potassium 05/26/2021 3.8  3.5 - 5.1 mmol/L Final   Chloride 05/26/2021 108  98 - 111 mmol/L Final   CO2 05/26/2021 24  22 - 32 mmol/L Final   Glucose, Bld 05/26/2021 88  70 - 99 mg/dL Final   Glucose reference range applies only to samples taken after fasting for at least 8 hours.   BUN 05/26/2021 10  6 - 20 mg/dL Final   Creatinine, Ser 05/26/2021 0.75  0.44 - 1.00 mg/dL Final   Calcium 05/26/2021 9.8  8.9 - 10.3 mg/dL Final   Total Protein 05/26/2021 7.1  6.5 - 8.1 g/dL Final   Albumin 05/26/2021 4.3  3.5 - 5.0 g/dL Final   AST 05/26/2021 21  15 - 41 U/L Final   ALT 05/26/2021 20  0 - 44 U/L Final   Alkaline Phosphatase 05/26/2021 66  38 - 126 U/L Final   Total Bilirubin 05/26/2021 0.7  0.3 - 1.2 mg/dL Final   GFR, Estimated 05/26/2021 >60  >60 mL/min Final   Comment: (NOTE) Calculated using the CKD-EPI Creatinine Equation (2021)    Anion gap 05/26/2021 10  5 - 15 Final   Performed at [facility], [place] [address] [place], [place] [address]   Opiates 05/26/2021 NONE DETECTED  NONE DETECTED Final   Cocaine 05/26/2021 NONE DETECTED  NONE DETECTED Final   Benzodiazepines 05/26/2021 NONE DETECTED  NONE DETECTED Final   Amphetamines 05/26/2021 NONE DETECTED  NONE DETECTED Final   Tetrahydrocannabinol 05/26/2021 POSITIVE (A) NONE DETECTED Final   Barbiturates 05/26/2021 NONE DETECTED  NONE DETECTED Final   Comment: (NOTE) DRUG SCREEN FOR MEDICAL PURPOSES ONLY.  IF CONFIRMATION IS NEEDED FOR ANY PURPOSE, NOTIFY LAB WITHIN 5 DAYS.  LOWEST DETECTABLE LIMITS FOR URINE DRUG SCREEN Drug Class  Cutoff (ng/mL) Amphetamine and metabolites    1000 Barbiturate and metabolites    200 Benzodiazepine                 007 Tricyclics and metabolites     300 Opiates and metabolites        300 Cocaine and metabolites        300 THC                            50 Performed at  [facility], [place] [address], [place], [place] [address]    WBC 05/26/2021 5.8  4.0 - 10.5 K/uL Final   RBC 05/26/2021 4.04  3.87 - 5.11 MIL/uL Final   Hemoglobin 05/26/2021 13.3  12.0 - 15.0 g/dL Final   HCT 05/26/2021 39.1  36.0 - 46.0 % Final   MCV 05/26/2021 96.8  80.0 - 100.0 fL Final   MCH 05/26/2021 32.9  26.0 - 34.0 pg Final   MCHC 05/26/2021 34.0  30.0 - 36.0 g/dL Final   RDW 05/26/2021 14.5  11.5 - 15.5 % Final   Platelets 05/26/2021 258  150 - 400 K/uL Final   nRBC 05/26/2021 0.0  0.0 - 0.2 % Final   Neutrophils Relative % 05/26/2021 67  % Final   Neutro Abs 05/26/2021 3.9  1.7 - 7.7 K/uL Final   Lymphocytes Relative 05/26/2021 22  % Final   Lymphs Abs 05/26/2021 1.3  0.7 - 4.0 K/uL Final   Monocytes Relative 05/26/2021 6  % Final   Monocytes Absolute 05/26/2021 0.4  0.1 - 1.0 K/uL Final   Eosinophils Relative 05/26/2021 4  % Final   Eosinophils Absolute 05/26/2021 0.2  0.0 - 0.5 K/uL Final   Basophils Relative 05/26/2021 1  % Final   Basophils Absolute 05/26/2021 0.0  0.0 - 0.1 K/uL Final   Immature Granulocytes 05/26/2021 0  % Final   Abs Immature Granulocytes 05/26/2021 0.02  0.00 - 0.07 K/uL Final   Performed at [facility], [place] [address], [place], [place] [address]   I-stat hCG, quantitative 05/26/2021 42.0 (A) <5 mIU/mL Final   Comment 3 05/26/2021          Final   Comment:   GEST. AGE      CONC.  (mIU/mL)   <=1 WEEK        5 - 50     2 WEEKS       50 - 500     3 WEEKS       100 - 10,000     4 WEEKS     1,000 - 30,000        FEMALE AND NON-PREGNANT FEMALE:     LESS THAN 5 mIU/mL    Acetaminophen (Tylenol), Serum 05/26/2021 <10 (A) 10 - 30 ug/mL Final   Comment: (NOTE) Therapeutic concentrations vary significantly. A range of 10-30 ug/mL  may be an effective concentration for many patients. However, some  are best treated at concentrations outside of this range. Acetaminophen concentrations >150 ug/mL at 4 hours after  ingestion  and >50 ug/mL at 12 hours after ingestion are often associated with  toxic reactions.  Performed at [facility], [place] [address], [place], [place] [address]    Alcohol, Ethyl (B) 05/26/2021 <10  <10 mg/dL Final   Comment: (NOTE) Lowest detectable limit for serum alcohol is 10 mg/dL.  For medical purposes only. Performed at [facility], [place] [address] [place], [place] [address]    Salicylate Lvl 93/81/0175 <7.0 (A) 7.0 - 30.0 mg/dL Final   Performed at [place] [address], [place], [place] [address]  Admission on 02/13/2021, Discharged on 02/13/2021  Component Date Value Ref Range Status   Sodium 02/13/2021 139  135 - 145 mmol/L Final   Potassium 02/13/2021 4.1  3.5 - 5.1 mmol/L Final   Chloride 02/13/2021 106  98 - 111 mmol/L Final   CO2 02/13/2021 24  22 - 32 mmol/L Final   Glucose, Bld 02/13/2021 98  70 - 99 mg/dL Final   Glucose reference range applies only to samples taken after fasting for at least 8 hours.   BUN 02/13/2021 13  6 - 20 mg/dL Final   Creatinine, Ser 02/13/2021 0.95  0.44 - 1.00 mg/dL Final   Calcium 02/13/2021 9.9  8.9 - 10.3 mg/dL Final   GFR, Estimated 02/13/2021 >60  >60 mL/min Final   Comment: (NOTE) Calculated using the CKD-EPI Creatinine Equation (2021)    Anion gap 02/13/2021 9  5 - 15 Final   Performed at [facility], [place] [address] [place], [place] [address]   WBC 02/13/2021 6.0  4.0 - 10.5 K/uL Final   RBC 02/13/2021 4.77  3.87 - 5.11 MIL/uL Final   Hemoglobin 02/13/2021 15.2 (A) 12.0 - 15.0 g/dL Final   HCT 02/13/2021 45.2  36.0 - 46.0 % Final   MCV 02/13/2021 94.8  80.0 - 100.0 fL Final   MCH 02/13/2021 31.9  26.0 - 34.0 pg Final   MCHC 02/13/2021 33.6  30.0 - 36.0 g/dL Final   RDW 02/13/2021 11.8  11.5 - 15.5 % Final   Platelets 02/13/2021 262  150 - 400 K/uL Final   nRBC 02/13/2021 0.0  0.0 - 0.2 % Final   Performed at [facility], [place] [address], [place], [place] [address]   Troponin I (High Sensitivity) 02/13/2021 2  <18 ng/L Final   Comment: (NOTE) Elevated high sensitivity troponin I (hsTnI) values and significant  changes across serial measurements may suggest ACS but many other  chronic and acute conditions are known to elevate hsTnI results.  Refer to the "Links" section for chest pain algorithms and additional  guidance. Performed at [facility], [place] [address], [place], [place] [address]    Troponin I (High Sensitivity) 02/13/2021 4  <18 ng/L Final   Comment: (NOTE) Elevated high sensitivity troponin I (hsTnI) values and significant  changes across serial measurements may suggest ACS but many other  chronic and acute conditions are known to elevate hsTnI results.  Refer to the "Links" section for chest pain algorithms and additional  guidance. Performed at [facility], [place] [address], [place],  [address]     Allergies: Cymbalta [duloxetine hcl], Dust mite extract, Escitalopram oxalate, and Other  PTA Medications: (Not in a hospital admission)   Medical Decision Making  Patient cannot contract for safety at this time.  Patient will be admitted to overnight assessment and will be reevaluated in the morning by psychiatry.    Recommendations  Based on my evaluation the patient does not appear to have an emergency medical condition. Admit patient for overnight continuous assessment, with reevaluation in the a.m. by psychiatry. EKG ordered, lab work was reviewed.  Paxil 40 p.o. mg daily and PRN trazodone 50 mg nightly, hydroxyzine 25 mg p.o.PRN TID ordered  Revonda Humphrey, NP 05/26/21  11:46  AM

## 2021-05-26 NOTE — ED Triage Notes (Addendum)
Patient arrives complaining of SI/HI. Patient states she recently got married, and her husband took her medication and told her, "she just needed to smoke weed." Patient endorses intermittent command AH to hurt people. Patient states, "It would feel so good right now, but I don't want to do that." Patient states she wants to hurt herself due to feeling this way. Patient endorses frustration with how she is feeling. Patient is aware the AH are not real, and states that they are not people she knows. Patient noted to be though blocking during questioning. Patient noted to have some difficulty answering questions about herself and history.

## 2021-05-26 NOTE — ED Notes (Addendum)
Pt is a direct admit from [place] (accepted by [employer], NP) admitted to continuous assessment endorsing passive SI no plan and HI toward husband no plan. Pt states, "It's my 3rd marriage and it's failed already. He was no good". Pt reports being off medication, Paxil, for a few weeks and need to restart medication. Calm, cooperative throughout assessment. Denies AVH. Oriented to unit and unit rules. Will monitor for safety.

## 2021-05-26 NOTE — ED Notes (Signed)
Pt to X-Ray via stretcher 
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
## 2021-05-26 NOTE — ED Notes (Signed)
Pt clothes shoes and keys to car in bag labeled and in cabinet for pt belongings 23-25. Pt put her purse with her money and phone and other valuables in her car. Security walked with pt to put purse in her car.

## 2021-05-26 NOTE — BH Assessment (Signed)
Disposition:   Clinician requested patient's nurse [name], RN), to place the TTS machine in patient's room for her initial assessment.

## 2021-05-26 NOTE — ED Provider Notes (Signed)
[facility] DEPT Provider Note   CSN: [id number] Arrival date & time: 05/26/21  0545     History Chief Complaint  Patient presents with   Suicidal   Homicidal    Lori Owens is a 60 y.o. female.  60 y.o female with a PMH of Asthma, COPD, Depression presents to the ED with a chief complaint of SI/HI. Patient states she recently got married (8 months ago) and her current husband decided she needed to stop taking her "depression medication" and "just smoke weed. Reports she has not been taking her medication in several months. She also endorses SI, HI, along with auditory hallucinations, stating the voices " keep talking to me, I am not sure what they are seen, but I feel like I keep pulling away from the code.  Reports on her way here while driving, she began to hear some of the voices and felt like she was in and out of sleep.  No prior attempts in the past, has been on antidepressants for several years.No chest pain, no shortness of breath, no abdominal pain. Cough at baseline.   The history is provided by the patient and medical records.      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   Tobacco use disorder 05/07/2015   Obesity 05/03/2015   Excessive daytime sleepiness 03/03/2015   Heart palpitations 12/24/2014   Snoring 12/23/2014   Witnessed apneic spells 12/23/2014    Cardiomyopathy ([place]) 09/19/2014   Mild persistent asthma 09/03/2014   Asthma exacerbation 09/02/2014   Suspicious mole 04/19/2013   SOB (shortness of breath) 04/19/2013   Hypokalemia 01/18/2013   Dehydration 01/18/2013   Tachycardia 01/18/2013    Past Surgical History:  Procedure Laterality Date   ABDOMINAL HYSTERECTOMY     NOSE SURGERY     polyp removal from nasal cavity    THROAT SURGERY     TONSILLECTOMY       OB History   No obstetric history on file.     Family History  Problem Relation Age of Onset   Diabetes Maternal Grandfather    Heart disease Maternal Grandfather    Cancer Maternal Grandfather    Hypertension Maternal Grandfather     Social History   Tobacco Use   Smoking status: Former    Packs/day: 1.00    Years: 20.00    Pack years: 20.00    Types: Cigarettes   Smokeless tobacco: Never  Vaping Use   Vaping Use: Never used  Substance Use Topics   Alcohol use: Yes    Alcohol/week: 0.0 standard drinks    Comment: Occasional   Drug use: Yes    Types: Marijuana    Comment: Occasional    Home Medications Prior to Admission medications   Medication Sig Start Date End Date Taking? Authorizing Provider  albuterol (PROVENTIL) (2.5 MG/3ML) 0.083% nebulizer solution TAKE 3 MLS BY NEBULIZATION  EVERY 6 HOURS AS NEEDED FOR WHEEZING OR SHORTNESS OF BREATH. 02/16/21   Gifford Shave, MD  albuterol (PROVENTIL) (5 MG/ML) 0.5% nebulizer solution Take 0.5 mLs (2.5 mg total) by nebulization every 6 (six) hours as needed for wheezing or shortness of breath. 04/29/21   Valarie Merino, MD  albuterol (VENTOLIN HFA) 108 (90 Base) MCG/ACT inhaler TAKE 2 PUFFS BY MOUTH EVERY 6 HOURS AS NEEDED FOR WHEEZE OR SHORTNESS OF BREATH 12/18/20   Gifford Shave, MD  ALPRAZolam Duanne Moron) 0.5 MG tablet Take 0.5 mg by mouth 2 (two) times daily as needed for anxiety.    [provider]  azithromycin (ZITHROMAX Z-PAK) 250 MG tablet Take 500 mg on day 1, then 250 mg once a day  for days 2, 3, 4, and 5. Patient not taking: Reported on 10/11/2018 12/19/17   Fatima Blank, MD  benzonatate (TESSALON) 100 MG capsule Take 1 capsule (100 mg total) by mouth every 8 (eight) hours. Patient not taking: Reported on 05/02/2019 11/24/18   Barrie Folk, PA-C  Biotin w/ Vitamins C & E (HAIR SKIN & NAILS GUMMIES) 1250-7.5-7.5 MCG-MG-UNT CHEW Chew 1 tablet by mouth daily.    [provider]  Cholecalciferol 5000 units capsule Take 5,000 Units by mouth daily.  05/30/15   [provider]  diphenhydrAMINE-APAP, sleep, (TYLENOL PM EXTRA STRENGTH PO) Take 4 tablets by mouth at bedtime.    [provider]  doxycycline (VIBRAMYCIN) 100 MG capsule Take 1 capsule (100 mg total) by mouth 2 (two) times daily. One po bid x 7 days Patient not taking: Reported on 10/12/2019 05/25/19   Milton Ferguson, MD  fluticasone [facility]) 50 MCG/ACT nasal spray Place 2 sprays into both nostrils daily as needed for allergies or rhinitis. 01/24/20   Gifford Shave, MD  ipratropium (ATROVENT) 0.02 % nebulizer solution Take 2.5 mLs (0.5 mg total) by nebulization 4 (four) times daily. 04/29/21   Valarie Merino, MD  lidocaine (LIDODERM) 5 % Place 1 patch onto the skin daily. Remove & Discard patch within 12 hours or as directed by MD 10/12/19   Lorin Glass, PA-C  montelukast (SINGULAIR) 10 MG tablet TAKE 1 TABLET BY MOUTH EVERY DAY 03/27/20   Gifford Shave, MD  PARoxetine (PAXIL) 40 MG tablet Take 1 tablet (40 mg total) by mouth every morning. 01/24/20   Gifford Shave, MD  predniSONE (DELTASONE) 10 MG tablet Take 2 tablets (20 mg total) by mouth daily. Patient not taking: Reported on 10/12/2019 05/25/19   Milton Ferguson, MD  predniSONE (DELTASONE) 10 MG tablet Take 2 tablets (20 mg total) by mouth daily. 04/29/21   Valarie Merino, MD  ranitidine (ZANTAC) 150 MG capsule Take 1 capsule (150 mg total) by mouth daily. Patient not taking: Reported on 05/02/2019 11/18/17    Fatima Blank, MD  SUPER B COMPLEX/C PO Take 1 tablet by mouth daily.    [provider]  zolpidem (AMBIEN) 5 MG tablet Take 1 tablet (5 mg total) by mouth at bedtime as needed for sleep. 04/13/20   Gifford Shave, MD    Allergies    Cymbalta [duloxetine hcl], Dust mite extract, Escitalopram oxalate, and Other  Review of Systems   Review of Systems  Constitutional:  Negative for chills and fever.  HENT:  Negative for sore throat.   Respiratory:  Negative for shortness of breath.   Cardiovascular:  Negative for chest pain.  Gastrointestinal:  Negative for abdominal pain, nausea and vomiting.  Genitourinary:  Negative for flank  pain.  Musculoskeletal:  Negative for back pain.  Skin:  Negative for pallor and wound.  Neurological:  Negative for light-headedness.  Psychiatric/Behavioral:  Positive for hallucinations and suicidal ideas. The patient is nervous/anxious.   All other systems reviewed and are negative.  Physical Exam Updated Vital Signs BP 125/76   Pulse 95   Temp 97.9 F (36.6 C) (Oral)   Resp 16   SpO2 95%   Physical Exam Vitals and nursing note reviewed.  Constitutional:      Appearance: Normal appearance.  HENT:     Head: Normocephalic and atraumatic.     Nose: Nose normal.     Mouth/Throat:     Mouth: Mucous membranes are moist.  Eyes:     Pupils: Pupils are equal, round, and reactive to light.  Cardiovascular:     Rate and Rhythm: Normal rate.  Pulmonary:     Effort: Pulmonary effort is normal.     Breath sounds: Examination of the right-upper field reveals wheezing. Examination of the right-middle field reveals wheezing. Examination of the right-lower field reveals decreased breath sounds. Examination of the left-lower field reveals decreased breath sounds. Decreased breath sounds and wheezing present.  Chest:     Chest wall: No tenderness.  Abdominal:     General: Abdomen is flat.     Tenderness: There is no abdominal tenderness.  There is no right CVA tenderness or left CVA tenderness.  Musculoskeletal:     Cervical back: Normal range of motion and neck supple.  Skin:    General: Skin is warm and dry.  Neurological:     Mental Status: She is alert and oriented to person, place, and time.    ED Results / Procedures / Treatments   Labs (all labs ordered are listed, but only abnormal results are displayed) Labs Reviewed  RAPID URINE DRUG SCREEN, HOSP PERFORMED - Abnormal; Notable for the following components:      Result Value   Tetrahydrocannabinol POSITIVE (*)    All other components within normal limits  ACETAMINOPHEN LEVEL - Abnormal; Notable for the following components:   Acetaminophen (Tylenol), Serum <10 (*)    All other components within normal limits  SALICYLATE LEVEL - Abnormal; Notable for the following components:   Salicylate Lvl <6.1 (*)    All other components within normal limits  I-STAT BETA HCG BLOOD, ED ([facility], [facility], [facility] ONLY) - Abnormal; Notable for the following components:   I-stat hCG, quantitative 42.0 (*)    All other components within normal limits  RESP PANEL BY RT-PCR (FLU A&B, COVID) ARPGX2  COMPREHENSIVE METABOLIC PANEL  CBC WITH DIFFERENTIAL/PLATELET  ETHANOL    EKG None  Radiology DG Chest 2 View  Result Date: 05/26/2021 CLINICAL DATA:  Suicidal/homicidal. EXAM: CHEST - 2 VIEW COMPARISON:  04/29/2021. FINDINGS: Mediastinum hilar structures normal. No focal infiltrate. No pleural effusion or pneumothorax. Heart size normal. Mild thoracic spine scoliosis and degenerative change. IMPRESSION: No acute cardiopulmonary disease. Electronically Signed   By: Marcello Moores  Register   On: 05/26/2021 07:28    Procedures Procedures   Medications Ordered in ED Medications - No data to display  ED Course  I have reviewed the triage vital signs and the nursing notes.  Pertinent labs & imaging results that were available during my care of the patient were reviewed by me and considered in my  medical decision making (see chart for details).  Clinical Course as of 05/26/21 0851  Tue May 26, 2021  9509 Tetrahydrocannabinol(!): POSITIVE [JS]  Clinical Course User Index [JS] Janeece Fitting, PA-C   MDM Rules/Calculators/A&P   Patient presents to the ED with a chief complaint of SI, HI, auditory hallucinations for some time.  Patient been taking antidepressants for several years, these were discontinued about a months ago as her husband stated "she is in the system obsolete ". Patient reports voices which she continues to "pull away from ".  History of COPD at baseline, does have a chronic cough.  But is here without any shortness of breath or chest pain.  Vitals are unremarkable on arrival satting at 99% on room air.  Lungs are diminished to auscultation along with wheezing noted to bilateral bases.  She is afebrile.  Overall nontoxic-appearing.  CBC without any leukocytosis, hemoglobin is within normal limits.  CMP without any electrolyte derangement, current levels within normal limits.  COVID, flu AMB are negative.  UDS is positive for THC, stated she has been smoking marijuana in order to help with her depression.  A TTS consultation was placed.  Patient has been accepted to the detox facility for further monitoring.  She remains hemodynamically stable for further management.  Portions of this note were generated with Lobbyist. Dictation errors may occur despite best attempts at proofreading.  Final Clinical Impression(s) / ED Diagnoses Final diagnoses:  Suicidal ideations  Cough    Rx / DC Orders ED Discharge Orders     None        Janeece Fitting, PA-C 05/26/21 7340    Carmin Muskrat, MD 05/26/21 1536

## 2021-05-27 MED ORDER — PAROXETINE HCL 40 MG PO TABS: 40.0000 mg | ORAL_TABLET | ORAL | 0 refills | Status: DC

## 2021-05-27 MED ORDER — PROMETHAZINE HCL 25 MG PO TABS
25.0000 mg | ORAL_TABLET | Freq: Once | ORAL | Status: AC
  Administered 2021-05-27: 25 mg via ORAL
  Filled 2021-05-27: qty 1

## 2021-05-27 MED ORDER — TRAZODONE HCL 50 MG PO TABS: 50.0000 mg | ORAL_TABLET | Freq: Every evening | ORAL | 0 refills | Status: DC | PRN

## 2021-05-27 MED ORDER — OXYMETAZOLINE HCL 0.05 % NA SOLN: 1.0000 | Freq: Two times a day (BID) | NASAL | Status: DC | PRN

## 2021-05-27 NOTE — ED Notes (Signed)
Pt c/o nausea and requests medication. Zofran 4 mg PRN given,

## 2021-05-27 NOTE — ED Notes (Signed)
Patient with dry heaves - no vomit noted. Informed provider and orders received - will continue monitoring for improvement and safety

## 2021-05-27 NOTE — ED Notes (Signed)
Pt asleep with even and unlabored respirations. No distress or discomfort noted. Pt remains safe on the unit. Will continue to monitor. 

## 2021-05-27 NOTE — ED Notes (Signed)
Patient went to sleep before new prn med given - will continue to monitor symptom relief and safety

## 2021-05-27 NOTE — ED Notes (Signed)
Patient with no sxs of nausea at this time.  Still walking around the unit at times - no sxs of distress noted. Will continue to monitor

## 2021-05-27 NOTE — ED Notes (Signed)
Pt c/o of headache at 6/10. Tylenol 650 mg PRN given.

## 2021-05-27 NOTE — ED Notes (Signed)
Pt ambulatory, alert, and oriented X4 on and off the unit. Education, support, and encouragement provided. Discharge summary/AVS, prescriptions, medications, and follow up appointments reviewed with pt and given to pt. Suicide prevention resources provided. Pt's belongings in locker returned and belongings sheet signed. Pt denies SI/HI, A/VH, pain, or any concerns at this time. Pt discharged to lobby to [facility] to be transported to [facility] to retrieve her car.

## 2021-05-27 NOTE — ED Notes (Signed)
[facility] called to transport pt to [employer] parking lot to retrieve her car.

## 2021-05-27 NOTE — Discharge Instructions (Addendum)

## 2021-05-27 NOTE — ED Notes (Signed)
Pt stated she is missing belongings that were put to wash. Did not find any garments infiormed pt I will continue looking.

## 2021-05-27 NOTE — ED Provider Notes (Signed)
FBC/OBS ASAP Discharge Summary  Date and Time: 05/27/2021 1:20 PM  Name: Lori Owens  MRN:  [id number]   Discharge Diagnoses:  Final diagnoses:  MDD (major depressive disorder), recurrent severe, without psychosis ([place])    Subjective: Patient states "I was having thoughts of hurting my husband and suicidal thoughts but I feel better."  She is insightful states "my mental health is worse more than that, I am looking forward to my daughter who is having another baby."  Patient is reassessed, face-to-face, by nurse practitioner.  She is seated on fold-down stretcher, no apparent distress.  She is alert and oriented, pleasant and cooperative during assessment.  She presents with bright mood and congruent affect.  She has normal speech and behavior, able to converse coherently with goal-directed thoughts.  She reports recent stressors include "I have a problem sleeping and nobody will give me Xanax, I feel like I am the caretaker for everybody but I have to take care of me too."  She reports primarily she assists with the care of her 20 year old mother.  She denies suicidal and homicidal ideations currently.  She denies any history of suicide attempts, denies any history of self-harm.  She contracts verbally for safety with this Probation officer.  She denies both auditory and visual hallucinations.  There is no evidence of delusional thought content and she denies symptoms of paranoia.  She is not currently followed by outpatient psychiatry but plans to follow with outpatient psychiatry moving forward.  She reports she has been prescribed Paxil, and it has been effective for her mood in the past.  She resides in [place] with her daughter currently, she denies access to weapons.  She is not currently employed.  She denies alcohol and substance use currently.  She reports last use of marijuana was 2 days ago.  She plans to stop using marijuana at this time, reports she was encouraged to use marijuana rather  than take her antidepressant medication by her ex husband.  Patient offered support and encouragement.  She does not have a phone number for anyone to reach for collateral information but would give consent for this writer to speak with her daughter, Apolonio Schneiders.  Unable to locate daughter's number.    Stay Summary:  HPI 05/26/2021:   During evaluation Tanecia Mccay is in sitting position in no acute distress.  She s alert, oriented x 4, calm, cooperative and attentive.  Her mood is depressed with congruent affect.  She has normal speech, and behavior.  Objectively there is no evidence of psychosis/mania or delusional thinking.  Patient is able to converse coherently, goal directed thoughts, no distractibility, or pre-occupation.  She also denies paranoia.  Endorses homicidal ideations, mainly towards people who have hurt her. States, "I want to jump on people, just grab them, and shake them".  Endorses suicidal ideations.  Denies having any plan, intent, or access to means.  Patient states she cannot contract for safety at this time.  States she would like to be restarted on Paxil.  States her spouse made her throw her medications away, because he thought marijuana was a better option.  Patient endorses auditory hallucinations.  States, "it is my own voice, it is hard to explain I bounce ideas and talk to myself".  Denies visual hallucinations   Patient states technically she is homeless, because she left her spouse and her home.  Patient states her plan upon discharge is to go live with her son for a few weeks.  States that she  can live with her daughter for 7 days out of the month.   Patient denies alcohol use, endorses marijuana use 1 bowl, every night at 4pm. Last use was yesterday, 05/25/2021.   Patient agrees to be admitted to overnight assessment and to be reevaluated in the a.m.   Total Time spent with patient: 30 minutes  Past Psychiatric History: Depression, anxiety Past Medical History:   Past Medical History:  Diagnosis Date   Asthma    COPD (chronic obstructive pulmonary disease) ([place])    Depression 01/18/2013   Headache(784.0)    Heart murmur    Melanoma ([place])    Seasonal allergies    Tobacco abuse 01/18/2013    Past Surgical History:  Procedure Laterality Date   ABDOMINAL HYSTERECTOMY     NOSE SURGERY     polyp removal from nasal cavity    THROAT SURGERY     TONSILLECTOMY     Family History:  Family History  Problem Relation Age of Onset   Diabetes Maternal Grandfather    Heart disease Maternal Grandfather    Cancer Maternal Grandfather    Hypertension Maternal Grandfather    Family Psychiatric History: None reported Social History:  Social History   Substance and Sexual Activity  Alcohol Use Yes   Alcohol/week: 0.0 standard drinks   Comment: Occasional     Social History   Substance and Sexual Activity  Drug Use Yes   Types: Marijuana   Comment: Occasional    Social History   Socioeconomic History   Marital status: Single    Spouse name: Not on file   Number of children: Not on file   Years of education: Not on file   Highest education level: Not on file  Occupational History   Not on file  Tobacco Use   Smoking status: Former    Packs/day: 1.00    Years: 20.00    Pack years: 20.00    Types: Cigarettes   Smokeless tobacco: Never  Vaping Use   Vaping Use: Never used  Substance and Sexual Activity   Alcohol use: Yes    Alcohol/week: 0.0 standard drinks    Comment: Occasional   Drug use: Yes    Types: Marijuana    Comment: Occasional   Sexual activity: Yes    Partners: Male    Birth control/protection: Condom  Other Topics Concern   Not on file  Social History Narrative   ** Merged History Encounter **   Lives with mother in a one story home.  Has 2 children.  Works as a Electrical engineer.    Education: some college.       Social Determinants of Health   Financial Resource Strain: Not on file  Food Insecurity: Not on  file  Transportation Needs: Not on file  Physical Activity: Not on file  Stress: Not on file  Social Connections: Not on file   SDOH:  SDOH Screenings   Alcohol Screen: Not on file  Depression (PHQ2-9): Not on file  Financial Resource Strain: Not on file  Food Insecurity: Not on file  Housing: Not on file  Physical Activity: Not on file  Social Connections: Not on file  Stress: Not on file  Tobacco Use: Medium Risk   Smoking Tobacco Use: Former   Smokeless Tobacco Use: Never  Transportation Needs: Not on file    Tobacco Cessation:  A prescription for an FDA-approved tobacco cessation medication was offered at discharge and the patient refused  Current Medications:  Current Facility-Administered  Medications  Medication Dose Route Frequency Provider Last Rate Last Admin   acetaminophen (TYLENOL) tablet 650 mg  650 mg Oral Q6H PRN Revonda Humphrey, NP   650 mg at 05/27/21 0905   albuterol (VENTOLIN HFA) 108 (90 Base) MCG/ACT inhaler 2 puff  2 puff Inhalation Q6H PRN Revonda Humphrey, NP   2 puff at 05/27/21 0906   alum & mag hydroxide-simeth (MAALOX/MYLANTA) 200-200-20 MG/5ML suspension 30 mL  30 mL Oral Q4H PRN Revonda Humphrey, NP       fluticasone (FLONASE) 50 MCG/ACT nasal spray 2 spray  2 spray Each Nare Daily PRN Revonda Humphrey, NP   2 spray at 05/26/21 2002   hydrOXYzine (ATARAX/VISTARIL) tablet 25 mg  25 mg Oral TID PRN Revonda Humphrey, NP   25 mg at 05/26/21 1951   magnesium hydroxide (MILK OF MAGNESIA) suspension 30 mL  30 mL Oral Daily PRN Revonda Humphrey, NP       montelukast (SINGULAIR) tablet 10 mg  10 mg Oral Daily Thomes Lolling H, NP   10 mg at 05/27/21 0905   nicotine (NICODERM CQ - dosed in mg/24 hours) patch 21 mg  21 mg Transdermal Q0600 Revonda Humphrey, NP   21 mg at 05/27/21 0612   ondansetron (ZOFRAN-ODT) disintegrating tablet 4 mg  4 mg Oral Q6H PRN Lindon Romp A, NP   4 mg at 05/27/21 0956   oxymetazoline (AFRIN) 0.05 % nasal spray 1  spray  1 spray Each Nare BID PRN Rozetta Nunnery, NP       PARoxetine (PAXIL) tablet 40 mg  40 mg Oral Clayborn Bigness H, NP   40 mg at 05/27/21 9518   traZODone (DESYREL) tablet 50 mg  50 mg Oral QHS PRN Revonda Humphrey, NP       Current Outpatient Medications  Medication Sig Dispense Refill   albuterol (PROVENTIL) (2.5 MG/3ML) 0.083% nebulizer solution TAKE 3 MLS BY NEBULIZATION EVERY 6 HOURS AS NEEDED FOR WHEEZING OR SHORTNESS OF BREATH. 75 mL 11   albuterol (VENTOLIN HFA) 108 (90 Base) MCG/ACT inhaler TAKE 2 PUFFS BY MOUTH EVERY 6 HOURS AS NEEDED FOR WHEEZE OR SHORTNESS OF BREATH 18 each 4   Cholecalciferol 5000 units capsule Take 5,000 Units by mouth daily.      diphenhydrAMINE-APAP, sleep, (TYLENOL PM EXTRA STRENGTH PO) Take 4 tablets by mouth at bedtime.     fluticasone (FLONASE) 50 MCG/ACT nasal spray Place 2 sprays into both nostrils daily as needed for allergies or rhinitis. 15.8 mL 2   ipratropium (ATROVENT) 0.02 % nebulizer solution Take 2.5 mLs (0.5 mg total) by nebulization 4 (four) times daily. 75 mL 12   lidocaine (LIDODERM) 5 % Place 1 patch onto the skin daily. Remove & Discard patch within 12 hours or as directed by MD 30 patch 0   montelukast (SINGULAIR) 10 MG tablet TAKE 1 TABLET BY MOUTH EVERY DAY 90 tablet 2   PARoxetine (PAXIL) 40 MG tablet Take 1 tablet (40 mg total) by mouth every morning. (Patient not taking: Reported on 05/26/2021) 30 tablet 2   SUPER B COMPLEX/C PO Take 1 tablet by mouth daily.     SYMBICORT 160-4.5 MCG/ACT inhaler Inhale 2 puffs into the lungs 2 (two) times daily.      PTA Medications: (Not in a hospital admission)   Musculoskeletal  Strength & Muscle Tone: within normal limits Gait & Station: normal Patient leans: N/A  Psychiatric Specialty Exam  Presentation  General Appearance:  Appropriate for Environment; Casual  Eye Contact:Good  Speech:Clear and Coherent; Normal Rate  Speech Volume:Normal  Handedness:Right   Mood  and Affect  Mood:Euthymic  Affect:Appropriate; Congruent   Thought Process  Thought Processes:Coherent  Descriptions of Associations:Intact  Orientation:Full (Time, Place and Person)  Thought Content:Logical  Diagnosis of Schizophrenia or Schizoaffective disorder in past: No  Duration of Psychotic Symptoms: Greater than six months   Hallucinations:Hallucinations: Auditory Description of Auditory Hallucinations: states she hears her own voice and she talks things through with herself  Ideas of Reference:None  Suicidal Thoughts:Suicidal Thoughts: Yes, Passive SI Passive Intent and/or Plan: Without Intent; Without Plan; Without Means to Carry Out  Homicidal Thoughts:Homicidal Thoughts: Yes, Passive HI Passive Intent and/or Plan: Without Intent; Without Plan; Without Means to Carry Out   Sensorium  Memory:Immediate Good; Recent Good; Remote Good  Judgment:Good  Insight:Fair   Executive Functions  Concentration:Good  Attention Span:Good  [place] of Knowledge:Good  Language:Good   Psychomotor Activity  Psychomotor Activity:Psychomotor Activity: Normal   Assets  Assets:Communication Skills; Desire for Improvement; Financial Resources/Insurance; Housing; Intimacy; Leisure Time; Physical Health; Resilience; Social Support; Talents/Skills; Transportation   Sleep  Sleep:Sleep: [place] Number of Hours of Sleep: 5   No data recorded  Physical Exam  Physical Exam Vitals and nursing note reviewed.  Constitutional:      Appearance: Normal appearance. She is well-developed and normal weight.  HENT:     Head: Normocephalic and atraumatic.     Nose: Nose normal.  Cardiovascular:     Rate and Rhythm: Normal rate.  Pulmonary:     Effort: Pulmonary effort is normal.  Musculoskeletal:        General: Normal range of motion.     Cervical back: Normal range of motion.  Neurological:     Mental Status: She is alert and oriented to person, place, and time.   Psychiatric:        Attention and Perception: Attention and perception normal.        Mood and Affect: Mood and affect normal.        Speech: Speech normal.        Behavior: Behavior normal. Behavior is cooperative.        Thought Content: Thought content normal.        Cognition and Memory: Cognition and memory normal.        Judgment: Judgment normal.   Review of Systems  Constitutional: Negative.   HENT: Negative.    Eyes: Negative.   Respiratory: Negative.    Cardiovascular: Negative.   Gastrointestinal: Negative.   Genitourinary: Negative.   Musculoskeletal: Negative.   Skin: Negative.   Neurological: Negative.   Endo/Heme/Allergies: Negative.   Psychiatric/Behavioral: Negative.    Blood pressure (!) 138/98, pulse 93, temperature 98.1 F (36.7 C), temperature source Oral, resp. rate 18, SpO2 98 %. There is no height or weight on file to calculate BMI.  Demographic Factors:  Caucasian  Loss Factors: NA  Historical Factors: NA  Risk Reduction Factors:   Sense of responsibility to family, Living with another person, especially a relative, Positive social support, Positive therapeutic relationship, and Positive coping skills or problem solving skills  Continued Clinical Symptoms:  More than one psychiatric diagnosis  Cognitive Features That Contribute To Risk:  None    Suicide Risk:  Minimal: No identifiable suicidal ideation.  Patients presenting with no risk factors but with morbid ruminations; may be classified as minimal risk based on the severity of the depressive symptoms  Plan  Of Care/Follow-up recommendations:  Follow-up with outpatient psychiatry, resources provided. Medications: -Trazodone 50 mg nightly as needed/sleep -Paroxetine 40 mg daily  Disposition: Discharge  Lucky Rathke, FNP 05/27/2021, 1:20 PM

## 2021-05-27 NOTE — ED Notes (Signed)
Provider notified of continuing c/o nausea - orders received

## 2021-05-27 NOTE — ED Notes (Signed)
Patient c/o nasal congestion. States the nasal spray hasn't worked and requested something that "opened her up" meaning her sinuses on further clarification. Patient has tried sudafed in the past. Explained to patient I would contact provider but I didn't think we could give that here. Patient verbalizes understanding and provider notified

## 2021-06-06 ENCOUNTER — Other Ambulatory Visit: Payer: Self-pay | Admitting: Family Medicine

## 2021-06-08 ENCOUNTER — Other Ambulatory Visit: Payer: Self-pay

## 2021-06-08 DIAGNOSIS — J4530 Mild persistent asthma, uncomplicated: Secondary | ICD-10-CM

## 2021-06-08 MED ORDER — TRAZODONE HCL 50 MG PO TABS: 50.0000 mg | ORAL_TABLET | Freq: Every evening | ORAL | 0 refills | Status: DC | PRN

## 2021-06-08 MED ORDER — ALBUTEROL SULFATE HFA 108 (90 BASE) MCG/ACT IN AERS: INHALATION_SPRAY | RESPIRATORY_TRACT | 4 refills | Status: DC

## 2021-06-08 MED ORDER — ALBUTEROL SULFATE (2.5 MG/3ML) 0.083% IN NEBU: INHALATION_SOLUTION | RESPIRATORY_TRACT | 11 refills | Status: AC

## 2021-06-08 MED ORDER — PAROXETINE HCL 40 MG PO TABS: 40.0000 mg | ORAL_TABLET | ORAL | 0 refills | Status: DC

## 2021-06-23 ENCOUNTER — Ambulatory Visit (INDEPENDENT_AMBULATORY_CARE_PROVIDER_SITE_OTHER): Payer: Medicare Other | Admitting: Family Medicine

## 2021-06-23 ENCOUNTER — Encounter: Payer: Self-pay | Admitting: Family Medicine

## 2021-06-23 ENCOUNTER — Other Ambulatory Visit: Payer: Self-pay

## 2021-06-23 VITALS — BP 119/80 | HR 76 | Ht 65.0 in | Wt 173.2 lb

## 2021-06-23 DIAGNOSIS — F332 Major depressive disorder, recurrent severe without psychotic features: Secondary | ICD-10-CM | POA: Diagnosis not present

## 2021-06-23 DIAGNOSIS — R0602 Shortness of breath: Secondary | ICD-10-CM

## 2021-06-23 DIAGNOSIS — Z23 Encounter for immunization: Secondary | ICD-10-CM

## 2021-06-23 DIAGNOSIS — J449 Chronic obstructive pulmonary disease, unspecified: Secondary | ICD-10-CM

## 2021-06-23 MED ORDER — TRAZODONE HCL 50 MG PO TABS: 150.0000 mg | ORAL_TABLET | Freq: Every evening | ORAL | 2 refills | Status: DC | PRN

## 2021-06-23 MED ORDER — PAROXETINE HCL 40 MG PO TABS: 40.0000 mg | ORAL_TABLET | ORAL | 0 refills | Status: DC

## 2021-06-23 MED ORDER — TRAZODONE HCL 50 MG PO TABS: 100.0000 mg | ORAL_TABLET | Freq: Every evening | ORAL | 2 refills | Status: DC | PRN

## 2021-06-23 NOTE — Patient Instructions (Addendum)
It was great seeing you today.  I am sorry you are having issues with depression.  Below is information on outpatient psychiatrist/psychologist.  I have refilled your Paxil and sent a prescription for your trazodone for 100 mg nightly.  If you have any thoughts of harming yourself please seek medical attention immediately.  I also sent a referral in for a pulmonologist to see you regarding your COPD.  If you have any questions or concerns call the clinic.  I hope you have a wonderful day!  Outpatient Mental Health Providers (No Insurance required or Self Pay)  [facility]  [phone] [email or web address] [facility]  [address] [place], [place] Crisis [phone]  [facility]) can see uninsured folks for outpatient therapy [email or web address] [address] [place], [place] [address] [phone]  [place] Mon-Fri, 8am-3pm [email or web address], [place], [place]   [place] [phone]- [place] [facility] for psych med management, there may be a wait- if [facility] is working with clients for OPT, they will coordinate with [place] for [place]   Walk-in-Clinic: Monday- Friday 9:00 AM - 4:00 PM [place], [place] [phone]  [facility] [place] ([employer]) walk in M-F 8am-12pm and  1pm-3pm [place]- [place]  [place]  Phone: [phone]  [facility] ([place] and substance challenges) [address], [place] [phone]    [email or web address], [place]     Phone:  [phone] [place]  [place]  [place]  [phone] [email or web address].gl   [facility] ( virtual or zoom therapy) [email or web address]  [place]  [place]    [facility] [phone]  grief counseling, dementia and caregiver support    [facility] Walk-in MWF 12:30 to 3:00     [place] [address]  [phone]  [email or web address].[email or web address] call to schedule an appointment     National Alliance on Mental Illness (NAMI) [place]- Wellness classes, Support groups        [address] [place], [place] [address] [phone]   CurrentJokes.cz   [facility]  (Psycho-social Rehabilitation clubhouse, Individual and group therapy) [address] [place], [place] [address]   [phone]- [phone]  24- Hour Availability:  *[place] or [phone] * [facility] [employer] (Domestic Violence, Rape, etc. )6167253763 [place] Sessions [phone] or [phone] * [place] [phone] only) [phone] (after hours) *[facility] Mobile Crisis Unit [phone] *[place] National Suicide Hotline (934) 225-1378 Diamantina Monks)

## 2021-06-23 NOTE — Progress Notes (Signed)
    SUBJECTIVE:   CHIEF COMPLAINT / HPI:   Depression Patient presents because she has been having issues with her depression and sweet.  She reports that she was following with a psychiatric provider but has no longer following with them and is trying to get connected with a new psychiatrist/psychologist.  She was taking Paxil as well as trazodone but is out of these prescriptions.  She was under the impression that she needed a referral for psychiatry to get a refill on the medications.  Denies any SI or HI.  Reports that she was taking 150 mg of trazodone which was helping her sleep.  Pulmonary concerns Patient reports a history of COPD and she used to follow with a pulmonologist who she would see yearly when she lived in [place].  She would like to be connected back with the pulmonologist.  He is on controller medications for this and reports ED visits 1-2 times a year because of her COPD.  OBJECTIVE:   BP 119/80   Pulse 76   Ht '5\' 5"'$  (1.651 m)   Wt 173 lb 3.2 oz (78.6 kg)   SpO2 96%   BMI 28.82 kg/m   General: Well-appearing 60 year old female in no acute distress  cardiac: Regular rate and rhythm, no murmurs appreciated  Respiratory: Normal work of breathing, lungs clear to auscultation bilaterally, mildly prolonged expiratory phase Abdomen: Soft, nontender, positive bowel sounds MSK: No gross abnormalities Psych: Pleasant mood, occasionally looks down, PHQ below  PHQ9 SCORE ONLY 06/23/2021 04/09/2020 01/24/2020  PHQ-9 Total Score 18 0 11     ASSESSMENT/PLAN:   MDD (major depressive disorder), recurrent severe, without psychosis ([place]) Patient reports chronic issues with depression.  She was taking Paxil as well as trazodone for sleep but has run out of these medications.  Was following with a psychiatric provider but is looking for a new one and has not seen her previous provider in "a long time".  Needs refills on these medications.  Provided patient with information on  psychiatric providers in the region and refilled her medications until she can establish with a new provider.  Discussed SI and HI and provided strict return precautions as well as precautions to proceed to the crisis center.  Provided information on this.  No further questions or concerns at this time.  SOB (shortness of breath) Patient reports history of COPD.  Was seeing pulmonologist In [place].  Referral placed for pulmonology per patient's request.     Gifford Shave, MD [place]

## 2021-06-24 NOTE — Assessment & Plan Note (Signed)
Patient reports chronic issues with depression.  She was taking Paxil as well as trazodone for sleep but has run out of these medications.  Was following with a psychiatric provider but is looking for a new one and has not seen her previous provider in "a long time".  Needs refills on these medications.  Provided patient with information on psychiatric providers in the region and refilled her medications until she can establish with a new provider.  Discussed SI and HI and provided strict return precautions as well as precautions to proceed to the crisis center.  Provided information on this.  No further questions or concerns at this time.

## 2021-06-24 NOTE — Assessment & Plan Note (Signed)
Patient reports history of COPD.  Was seeing pulmonologist In [place].  Referral placed for pulmonology per patient's request.

## 2021-07-15 ENCOUNTER — Other Ambulatory Visit: Payer: Self-pay | Admitting: Family Medicine

## 2021-07-30 ENCOUNTER — Other Ambulatory Visit: Payer: Self-pay | Admitting: Family Medicine

## 2021-08-04 ENCOUNTER — Ambulatory Visit (INDEPENDENT_AMBULATORY_CARE_PROVIDER_SITE_OTHER): Payer: Medicare HMO | Admitting: Pulmonary Disease

## 2021-08-04 ENCOUNTER — Other Ambulatory Visit: Payer: Self-pay

## 2021-08-04 ENCOUNTER — Encounter: Payer: Self-pay | Admitting: Pulmonary Disease

## 2021-08-04 VITALS — BP 110/70 | HR 83 | Temp 98.1°F | Ht 65.0 in | Wt 172.8 lb

## 2021-08-04 DIAGNOSIS — Z87891 Personal history of nicotine dependence: Secondary | ICD-10-CM | POA: Diagnosis not present

## 2021-08-04 DIAGNOSIS — J441 Chronic obstructive pulmonary disease with (acute) exacerbation: Secondary | ICD-10-CM | POA: Diagnosis not present

## 2021-08-04 DIAGNOSIS — J449 Chronic obstructive pulmonary disease, unspecified: Secondary | ICD-10-CM | POA: Diagnosis not present

## 2021-08-04 MED ORDER — TRELEGY ELLIPTA 100-62.5-25 MCG/INH IN AEPB: 1.0000 | INHALATION_SPRAY | Freq: Every day | RESPIRATORY_TRACT | 0 refills | Status: DC

## 2021-08-04 MED ORDER — TRELEGY ELLIPTA 100-62.5-25 MCG/INH IN AEPB: 1.0000 | INHALATION_SPRAY | Freq: Every day | RESPIRATORY_TRACT | 6 refills | Status: DC

## 2021-08-04 MED ORDER — AZITHROMYCIN 250 MG PO TABS: ORAL_TABLET | ORAL | 0 refills | Status: DC

## 2021-08-04 MED ORDER — PREDNISONE 10 MG PO TABS: 40.0000 mg | ORAL_TABLET | Freq: Every day | ORAL | 0 refills | Status: DC

## 2021-08-04 NOTE — Progress Notes (Signed)
Patient seen in the office today and instructed on use of Trelegy 124mcg.  Patient expressed understanding and demonstrated technique.  [facility] 08/04/21

## 2021-08-04 NOTE — Patient Instructions (Signed)
Take prednisone 40 mg daily for 5 days  Start azithromycin pack for 5 days  Start Trelegy Ellipta 1 puff daily -Rinse mouth out after each use  Continue to use albuterol inhaler or nebulizer treatment every 4-6 hours as needed for cough, wheezing, shortness of breath or chest tightness  We will obtain pulmonary function tests in about 2 weeks once you are feeling better.

## 2021-08-04 NOTE — Progress Notes (Signed)
Synopsis: Referred in September 2022 for COPD by Madison Hickman, MD  Subjective:   PATIENT ID: Lori Owens GENDER: female DOB: April 30, 1961, MRN: [id number]  HPI  Chief Complaint  Patient presents with   Consult    Referred by PCP for COPD. States she was diagnosed with COPD for the past 20 years. States she wants to get on better medications for COPD.    Lori Owens is a 60 year old woman, former smoker with history asthma who is referred to the pulmonary clinic for evaluation of COPD.   She reports being diagnosed with COPD about 10 years ago. She is currently usign symbicort 160-68mcg 2 puffs twice daily and as needed albuterol. She recently moved back to the [place] area to care for her mother. Since moving back from [facility], she has had increased dyspnea, cough, wheezing and night time awakenings with cough/dyspnea/wheezing that responds to albuterol nebulizer treatments. She has sputum production in the mornings.  She denies issues with seasonal allergies or hot or cold temperatures bothering her breathing. She does have sinus congestion and post nasal drainage. She uses flonase which helps. She is suing singulair daily.   She is also allergic to cats and dogs which both are in her mothers house. There is no carpeting. She quit smoking about 10 years ago and has a 20 pack year smoking history. Her mother smoked around her when she was a child.  Past Medical History:  Diagnosis Date   Asthma    COPD (chronic obstructive pulmonary disease) ([place])    Depression 01/18/2013   Headache(784.0)    Heart murmur    Melanoma ([place])    Seasonal allergies    Tobacco abuse 01/18/2013     Family History  Problem Relation Age of Onset   Diabetes Maternal Grandfather    Heart disease Maternal Grandfather    Cancer Maternal Grandfather    Hypertension Maternal Grandfather      Social History   Socioeconomic History   Marital status: Single    Spouse name: Not on file    Number of children: Not on file   Years of education: Not on file   Highest education level: Not on file  Occupational History   Not on file  Tobacco Use   Smoking status: Former    Packs/day: 1.00    Years: 20.00    Pack years: 20.00    Types: Cigarettes   Smokeless tobacco: Never  Vaping Use   Vaping Use: Never used  Substance and Sexual Activity   Alcohol use: Yes    Alcohol/week: 0.0 standard drinks    Comment: Occasional   Drug use: Yes    Types: Marijuana    Comment: Occasional   Sexual activity: Yes    Partners: Male    Birth control/protection: Condom  Other Topics Concern   Not on file  Social History Narrative   ** Merged History Encounter **   Lives with mother in a one story home.  Has 2 children.  Works as a Electrical engineer.    Education: some college.       Social Determinants of Health   Financial Resource Strain: Not on file  Food Insecurity: Not on file  Transportation Needs: Not on file  Physical Activity: Not on file  Stress: Not on file  Social Connections: Not on file  Intimate Partner Violence: Not on file     Allergies  Allergen Reactions   Cymbalta [Duloxetine Hcl]  Suicidal- did not help   Dust Mite Extract    Escitalopram Oxalate     Suicidal.   Other     Pt is highly allergic to cats/dogs     Outpatient Medications Prior to Visit  Medication Sig Dispense Refill   albuterol (PROVENTIL) (2.5 MG/3ML) 0.083% nebulizer solution TAKE 3 MLS BY NEBULIZATION EVERY 6 HOURS AS NEEDED FOR WHEEZING OR SHORTNESS OF BREATH. 75 mL 11   albuterol (VENTOLIN HFA) 108 (90 Base) MCG/ACT inhaler TAKE 2 PUFFS BY MOUTH EVERY 6 HOURS AS NEEDED FOR WHEEZE OR SHORTNESS OF BREATH 18 each 4   Cholecalciferol 5000 units capsule Take 5,000 Units by mouth daily.      diphenhydrAMINE-APAP, sleep, (TYLENOL PM EXTRA STRENGTH PO) Take 4 tablets by mouth at bedtime.     fluticasone (FLONASE) 50 MCG/ACT nasal spray Place 2 sprays into both nostrils daily as needed  for allergies or rhinitis. 15.8 mL 2   lidocaine (LIDODERM) 5 % Place 1 patch onto the skin daily. Remove & Discard patch within 12 hours or as directed by MD 30 patch 0   montelukast (SINGULAIR) 10 MG tablet TAKE 1 TABLET BY MOUTH EVERY DAY 90 tablet 2   PARoxetine (PAXIL) 40 MG tablet TAKE 1 TABLET BY MOUTH EVERY DAY IN THE MORNING 30 tablet 0   SUPER B COMPLEX/C PO Take 1 tablet by mouth daily.     traZODone (DESYREL) 50 MG tablet TAKE 2 TABLETS (100 MG TOTAL) BY MOUTH AT BEDTIME AS NEEDED FOR SLEEP. 180 tablet 2   ipratropium (ATROVENT) 0.02 % nebulizer solution Take 2.5 mLs (0.5 mg total) by nebulization 4 (four) times daily. 75 mL 12   SYMBICORT 160-4.5 MCG/ACT inhaler Inhale 2 puffs into the lungs 2 (two) times daily.     No facility-administered medications prior to visit.   Review of Systems  Constitutional:  Negative for chills, fever, malaise/fatigue and weight loss.  HENT:  Positive for congestion. Negative for sinus pain and sore throat.   Eyes: Negative.   Respiratory:  Positive for cough, sputum production and wheezing. Negative for hemoptysis and shortness of breath.   Cardiovascular:  Negative for chest pain, palpitations, orthopnea, claudication and leg swelling.  Gastrointestinal:  Negative for abdominal pain, heartburn, nausea and vomiting.  Genitourinary: Negative.   Musculoskeletal:  Negative for joint pain and myalgias.  Skin:  Negative for rash.  Neurological:  Negative for weakness.  Endo/Heme/Allergies: Negative.   Psychiatric/Behavioral: Negative.     Objective:   Vitals:   08/04/21 1425  BP: 110/70  Pulse: 83  Temp: 98.1 F (36.7 C)  TempSrc: Oral  SpO2: 95%  Weight: 172 lb 12.8 oz (78.4 kg)  Height: 5\' 5"  (1.651 m)   Physical Exam Constitutional:      General: She is not in acute distress.    Appearance: She is not ill-appearing.  HENT:     Head: Normocephalic and atraumatic.  Eyes:     General: No scleral icterus.    Conjunctiva/sclera:  Conjunctivae normal.     Pupils: Pupils are equal, round, and reactive to light.  Cardiovascular:     Rate and Rhythm: Normal rate and regular rhythm.     Pulses: Normal pulses.     Heart sounds: Normal heart sounds. No murmur heard. Pulmonary:     Effort: Pulmonary effort is normal.     Breath sounds: Rhonchi present. No rales.  Abdominal:     General: Bowel sounds are normal.     Palpations: Abdomen  is soft.  Musculoskeletal:     Right lower leg: No edema.     Left lower leg: No edema.  Lymphadenopathy:     Cervical: No cervical adenopathy.  Skin:    General: Skin is warm and dry.  Neurological:     General: No focal deficit present.     Mental Status: She is alert.  Psychiatric:        Mood and Affect: Mood normal.        Behavior: Behavior normal.        Thought Content: Thought content normal.        Judgment: Judgment normal.   CBC    Component Value Date/Time   WBC 5.8 05/26/2021 0703   RBC 4.04 05/26/2021 0703   HGB 13.3 05/26/2021 0703   HCT 39.1 05/26/2021 0703   PLT 258 05/26/2021 0703   MCV 96.8 05/26/2021 0703   MCH 32.9 05/26/2021 0703   MCHC 34.0 05/26/2021 0703   RDW 14.5 05/26/2021 0703   LYMPHSABS 1.3 05/26/2021 0703   MONOABS 0.4 05/26/2021 0703   EOSABS 0.2 05/26/2021 0703   BASOSABS 0.0 05/26/2021 0703   BMP Latest Ref Rng & Units 05/26/2021 02/13/2021 05/02/2020  Glucose 70 - 99 mg/dL 88 98 116(H)  BUN 6 - 20 mg/dL 10 13 13   Creatinine 0.44 - 1.00 mg/dL 0.75 0.95 0.70  BUN/Creat Ratio 9 - 23 - - -  Sodium 135 - 145 mmol/L 142 139 140  Potassium 3.5 - 5.1 mmol/L 3.8 4.1 4.2  Chloride 98 - 111 mmol/L 108 106 103  CO2 22 - 32 mmol/L 24 24 24   Calcium 8.9 - 10.3 mg/dL 9.8 9.9 10.0   Chest imaging: CXR 05/26/21 Mediastinum hilar structures normal. No focal infiltrate. No pleural effusion or pneumothorax. Heart size normal. Mild thoracic spine scoliosis and degenerative change.  PFT: PFT Results Latest Ref Rng & Units 05/02/2015  FVC-Pre L  2.48  FVC-Predicted Pre % 66  FVC-Post L 3.24  FVC-Predicted Post % 87  Pre FEV1/FVC % % 69  Post FEV1/FCV % % 75  FEV1-Pre L 1.70  FEV1-Predicted Pre % 58  FEV1-Post L 2.42  DLCO uncorrected ml/min/mmHg 23.06  DLCO UNC% % 85  DLVA Predicted % 87  TLC L 6.64  TLC % Predicted % 124  RV % Predicted % 203  Moderate obstructive defect with reversibility to bronchodilators.  Echo 2016: LVEF 55-60%. Grade I diastolic dysfunction. Trace TR  Assessment & Plan:   Chronic obstructive pulmonary disease, unspecified COPD type ([place]) - Plan: Pulmonary Function Test, Fluticasone-Umeclidin-Vilant (TRELEGY ELLIPTA) 100-62.5-25 MCG/INH AEPB  COPD with acute exacerbation ([place]) - Plan: predniSONE (DELTASONE) 10 MG tablet, azithromycin (ZITHROMAX) 250 MG tablet  Former tobacco use - Plan: Ambulatory Referral for Lung Cancer Scre  Discussion: Lori Owens is a 60 year old woman, former smoker with history asthma who is referred to the pulmonary clinic for evaluation of COPD.   She may have asthma vs asthma-COPD overlap syndrome. She had PFTs in 2016 which showed reversible obstruction. Given her increase in symptoms along with night time awakenings and rhonchi on exam today, we will  treat her for COPD exacerbation with prednisone and azithromycin. We will change her inhaler to Trelegy Ellipta 1 puff daily and she can stop the symbicort. She can continue albuterol nebulizer treatments and inhaler as needed.  We will obtain pulmonary function tests in 2-4 weeks after she has recovered from her exacerbation. We will refer her to our lung cancer screening  program as she has 20 pack year smoking history and quit within the last 15 years.   She is to continue flonase for her sinus congestion/postnasal drainage. She is to continue montelukast. We can consider CBC with diff and IgE testing in the future.  Follow up in 3 months.  Freda Jackson, MD [facility] Office:  [phone]   Current Outpatient Medications:    albuterol (PROVENTIL) (2.5 MG/3ML) 0.083% nebulizer solution, TAKE 3 MLS BY NEBULIZATION EVERY 6 HOURS AS NEEDED FOR WHEEZING OR SHORTNESS OF BREATH., Disp: 75 mL, Rfl: 11   albuterol (VENTOLIN HFA) 108 (90 Base) MCG/ACT inhaler, TAKE 2 PUFFS BY MOUTH EVERY 6 HOURS AS NEEDED FOR WHEEZE OR SHORTNESS OF BREATH, Disp: 18 each, Rfl: 4   azithromycin (ZITHROMAX) 250 MG tablet, Take as directed, Disp: 6 tablet, Rfl: 0   Cholecalciferol 5000 units capsule, Take 5,000 Units by mouth daily. , Disp: , Rfl:    diphenhydrAMINE-APAP, sleep, (TYLENOL PM EXTRA STRENGTH PO), Take 4 tablets by mouth at bedtime., Disp: , Rfl:    fluticasone (FLONASE) 50 MCG/ACT nasal spray, Place 2 sprays into both nostrils daily as needed for allergies or rhinitis., Disp: 15.8 mL, Rfl: 2   Fluticasone-Umeclidin-Vilant (TRELEGY ELLIPTA) 100-62.5-25 MCG/INH AEPB, Inhale 1 puff into the lungs daily., Disp: 1 each, Rfl: 6   lidocaine (LIDODERM) 5 %, Place 1 patch onto the skin daily. Remove & Discard patch within 12 hours or as directed by MD, Disp: 30 patch, Rfl: 0   montelukast (SINGULAIR) 10 MG tablet, TAKE 1 TABLET BY MOUTH EVERY DAY, Disp: 90 tablet, Rfl: 2   PARoxetine (PAXIL) 40 MG tablet, TAKE 1 TABLET BY MOUTH EVERY DAY IN THE MORNING, Disp: 30 tablet, Rfl: 0   predniSONE (DELTASONE) 10 MG tablet, Take 4 tablets (40 mg total) by mouth daily with breakfast., Disp: 20 tablet, Rfl: 0   SUPER B COMPLEX/C PO, Take 1 tablet by mouth daily., Disp: , Rfl:    traZODone (DESYREL) 50 MG tablet, TAKE 2 TABLETS (100 MG TOTAL) BY MOUTH AT BEDTIME AS NEEDED FOR SLEEP., Disp: 180 tablet, Rfl: 2

## 2021-08-06 ENCOUNTER — Encounter: Payer: Self-pay | Admitting: Pulmonary Disease

## 2021-08-07 ENCOUNTER — Other Ambulatory Visit: Payer: Self-pay | Admitting: Family Medicine

## 2021-08-07 DIAGNOSIS — J4530 Mild persistent asthma, uncomplicated: Secondary | ICD-10-CM

## 2021-09-02 ENCOUNTER — Other Ambulatory Visit: Payer: Self-pay | Admitting: *Deleted

## 2021-09-02 DIAGNOSIS — Z87891 Personal history of nicotine dependence: Secondary | ICD-10-CM

## 2021-09-05 ENCOUNTER — Encounter (HOSPITAL_COMMUNITY): Payer: Self-pay

## 2021-09-05 ENCOUNTER — Other Ambulatory Visit: Payer: Self-pay

## 2021-09-05 ENCOUNTER — Inpatient Hospital Stay (HOSPITAL_COMMUNITY)
Admission: EM | Admit: 2021-09-05 | Discharge: 2021-09-08 | DRG: 192 | Disposition: A | Discharge status: Home / Self Care | Payer: Medicare HMO | Attending: Internal Medicine | Admitting: Internal Medicine

## 2021-09-05 ENCOUNTER — Emergency Department (HOSPITAL_COMMUNITY): Payer: Medicare HMO

## 2021-09-05 DIAGNOSIS — Z20822 Contact with and (suspected) exposure to covid-19: Secondary | ICD-10-CM | POA: Diagnosis present

## 2021-09-05 DIAGNOSIS — Z716 Tobacco abuse counseling: Secondary | ICD-10-CM

## 2021-09-05 DIAGNOSIS — J440 Chronic obstructive pulmonary disease with acute lower respiratory infection: Secondary | ICD-10-CM | POA: Diagnosis present

## 2021-09-05 DIAGNOSIS — F17210 Nicotine dependence, cigarettes, uncomplicated: Secondary | ICD-10-CM | POA: Diagnosis present

## 2021-09-05 DIAGNOSIS — J441 Chronic obstructive pulmonary disease with (acute) exacerbation: Secondary | ICD-10-CM | POA: Diagnosis not present

## 2021-09-05 DIAGNOSIS — Z90710 Acquired absence of both cervix and uterus: Secondary | ICD-10-CM

## 2021-09-05 DIAGNOSIS — Z888 Allergy status to other drugs, medicaments and biological substances status: Secondary | ICD-10-CM

## 2021-09-05 DIAGNOSIS — F32A Depression, unspecified: Secondary | ICD-10-CM | POA: Diagnosis present

## 2021-09-05 DIAGNOSIS — Z720 Tobacco use: Secondary | ICD-10-CM

## 2021-09-05 DIAGNOSIS — Z8249 Family history of ischemic heart disease and other diseases of the circulatory system: Secondary | ICD-10-CM

## 2021-09-05 DIAGNOSIS — Z85820 Personal history of malignant melanoma of skin: Secondary | ICD-10-CM

## 2021-09-05 DIAGNOSIS — Z79899 Other long term (current) drug therapy: Secondary | ICD-10-CM

## 2021-09-05 DIAGNOSIS — Z833 Family history of diabetes mellitus: Secondary | ICD-10-CM

## 2021-09-05 DIAGNOSIS — F419 Anxiety disorder, unspecified: Secondary | ICD-10-CM

## 2021-09-05 DIAGNOSIS — J209 Acute bronchitis, unspecified: Secondary | ICD-10-CM | POA: Diagnosis present

## 2021-09-05 LAB — COMPREHENSIVE METABOLIC PANEL
ALT: 20 U/L (ref 0–44)
AST: 18 U/L (ref 15–41)
Albumin: 4.2 g/dL (ref 3.5–5.0)
Alkaline Phosphatase: 78 U/L (ref 38–126)
Anion gap: 9 (ref 5–15)
BUN: 20 mg/dL (ref 6–20)
CO2: 23 mmol/L (ref 22–32)
Calcium: 9.7 mg/dL (ref 8.9–10.3)
Chloride: 109 mmol/L (ref 98–111)
Creatinine, Ser: 0.58 mg/dL (ref 0.44–1.00)
GFR, Estimated: >60 mL/min (ref >60)
Glucose, Bld: 92 mg/dL (ref 70–99)
Potassium: 4.2 mmol/L (ref 3.5–5.1)
Sodium: 141 mmol/L (ref 135–145)
Total Bilirubin: 0.8 mg/dL (ref 0.3–1.2)
Total Protein: 7.5 g/dL (ref 6.5–8.1)

## 2021-09-05 LAB — CBC WITH DIFFERENTIAL/PLATELET
Abs Immature Granulocytes: 0.01 10*3/uL (ref 0.00–0.07)
Basophils Absolute: 0.1 10*3/uL (ref 0.0–0.1)
Basophils Relative: 1 %
Eosinophils Absolute: 0.4 10*3/uL (ref 0.0–0.5)
Eosinophils Relative: 7 %
HCT: 42.5 % (ref 36.0–46.0)
Hemoglobin: 14.2 g/dL (ref 12.0–15.0)
Immature Granulocytes: 0 %
Lymphocytes Relative: 22 %
Lymphs Abs: 1.3 10*3/uL (ref 0.7–4.0)
MCH: 31.1 pg (ref 26.0–34.0)
MCHC: 33.4 g/dL (ref 30.0–36.0)
MCV: 93.2 fL (ref 80.0–100.0)
Monocytes Absolute: 0.4 10*3/uL (ref 0.1–1.0)
Monocytes Relative: 7 %
Neutro Abs: 3.8 10*3/uL (ref 1.7–7.7)
Neutrophils Relative %: 63 %
Platelets: 271 10*3/uL (ref 150–400)
RBC: 4.56 MIL/uL (ref 3.87–5.11)
RDW: 12.3 % (ref 11.5–15.5)
WBC: 5.9 10*3/uL (ref 4.0–10.5)
nRBC: 0 % (ref 0.0–0.2)

## 2021-09-05 LAB — RESP PANEL BY RT-PCR (FLU A&B, COVID) ARPGX2
Influenza A by PCR: NEGATIVE
Influenza B by PCR: NEGATIVE
SARS Coronavirus 2 by RT PCR: NEGATIVE

## 2021-09-05 LAB — BRAIN NATRIURETIC PEPTIDE: B Natriuretic Peptide: 17.9 pg/mL (ref 0.0–100.0)

## 2021-09-05 MED ORDER — IPRATROPIUM-ALBUTEROL 0.5-2.5 (3) MG/3ML IN SOLN
3.0000 mL | Freq: Four times a day (QID) | RESPIRATORY_TRACT | Status: DC
  Administered 2021-09-06 – 2021-09-07 (×8): 3 mL via RESPIRATORY_TRACT
  Filled 2021-09-05 (×8): qty 3

## 2021-09-05 MED ORDER — GUAIFENESIN ER 600 MG PO TB12
1200.0000 mg | ORAL_TABLET | Freq: Two times a day (BID) | ORAL | Status: AC
  Administered 2021-09-06 – 2021-09-08 (×6): 1200 mg via ORAL
  Filled 2021-09-05 (×6): qty 2

## 2021-09-05 MED ORDER — SODIUM CHLORIDE 3 % IN NEBU: 4.0000 mL | INHALATION_SOLUTION | Freq: Two times a day (BID) | RESPIRATORY_TRACT | Status: DC

## 2021-09-05 MED ORDER — ACETAMINOPHEN 325 MG PO TABS
650.0000 mg | ORAL_TABLET | Freq: Four times a day (QID) | ORAL | Status: DC | PRN
  Administered 2021-09-06 – 2021-09-07 (×2): 650 mg via ORAL
  Filled 2021-09-05 (×3): qty 2

## 2021-09-05 MED ORDER — ALBUTEROL SULFATE (2.5 MG/3ML) 0.083% IN NEBU
10.0000 mg | INHALATION_SOLUTION | Freq: Once | RESPIRATORY_TRACT | Status: AC
  Administered 2021-09-05: 10 mg via RESPIRATORY_TRACT

## 2021-09-05 MED ORDER — AZITHROMYCIN 250 MG PO TABS
250.0000 mg | ORAL_TABLET | Freq: Every day | ORAL | Status: DC
  Administered 2021-09-07 – 2021-09-08 (×2): 250 mg via ORAL
  Filled 2021-09-05 (×2): qty 1

## 2021-09-05 MED ORDER — PREDNISONE 20 MG PO TABS
60.0000 mg | ORAL_TABLET | Freq: Once | ORAL | Status: AC
  Administered 2021-09-05: 60 mg via ORAL
  Filled 2021-09-05: qty 3

## 2021-09-05 MED ORDER — METHYLPREDNISOLONE SODIUM SUCC 125 MG IJ SOLR
80.0000 mg | INTRAMUSCULAR | Status: AC
  Administered 2021-09-06 – 2021-09-08 (×3): 80 mg via INTRAVENOUS
  Filled 2021-09-05 (×4): qty 2

## 2021-09-05 MED ORDER — POLYETHYLENE GLYCOL 3350 17 G PO PACK: 17.0000 g | PACK | Freq: Every day | ORAL | Status: DC | PRN

## 2021-09-05 MED ORDER — ALBUTEROL SULFATE (2.5 MG/3ML) 0.083% IN NEBU
INHALATION_SOLUTION | RESPIRATORY_TRACT | Status: AC
  Filled 2021-09-05: qty 3

## 2021-09-05 MED ORDER — IPRATROPIUM-ALBUTEROL 0.5-2.5 (3) MG/3ML IN SOLN
3.0000 mL | Freq: Once | RESPIRATORY_TRACT | Status: AC
  Administered 2021-09-05: 3 mL via RESPIRATORY_TRACT
  Filled 2021-09-05: qty 3

## 2021-09-05 MED ORDER — AZITHROMYCIN 250 MG PO TABS
500.0000 mg | ORAL_TABLET | Freq: Every day | ORAL | Status: AC
  Administered 2021-09-06: 500 mg via ORAL
  Filled 2021-09-05: qty 2

## 2021-09-05 MED ORDER — ALBUTEROL (5 MG/ML) CONTINUOUS INHALATION SOLN: 10.0000 mg/h | INHALATION_SOLUTION | Freq: Once | RESPIRATORY_TRACT | Status: DC

## 2021-09-05 MED ORDER — ONDANSETRON HCL 4 MG/2ML IJ SOLN: 4.0000 mg | Freq: Four times a day (QID) | INTRAMUSCULAR | Status: DC | PRN

## 2021-09-05 MED ORDER — ALBUTEROL SULFATE (2.5 MG/3ML) 0.083% IN NEBU
2.5000 mg | INHALATION_SOLUTION | Freq: Once | RESPIRATORY_TRACT | Status: AC
  Administered 2021-09-05: 2.5 mg via RESPIRATORY_TRACT
  Filled 2021-09-05: qty 3

## 2021-09-05 MED ORDER — ALBUTEROL SULFATE HFA 108 (90 BASE) MCG/ACT IN AERS
2.0000 | INHALATION_SPRAY | Freq: Once | RESPIRATORY_TRACT | Status: AC
  Administered 2021-09-05: 2 via RESPIRATORY_TRACT
  Filled 2021-09-05: qty 6.7

## 2021-09-05 MED ORDER — MELATONIN 3 MG PO TABS: 3.0000 mg | ORAL_TABLET | Freq: Every evening | ORAL | Status: DC | PRN

## 2021-09-05 NOTE — ED Notes (Signed)
Patient given food and drink.

## 2021-09-05 NOTE — ED Provider Notes (Signed)
Emergency Medicine Provider Triage Evaluation Note  Lori Owens , a 60 y.o. female  was evaluated in triage.  Pt complains of shortness of breath.  This is been going on for 2 to 3 weeks.  Is been worsening recently, she reports that her oximeter read her her oxygen level at 90% last night.  She does have a history of COPD, current smoker, not on oxygen at home.  Last time she used her rescue inhaler was 2 days ago, her pulmonologist ordered her a DuoNeb which she has not been able to pick up yet..  Review of Systems  Positive: Shortness of breath, cough Negative: Chest pain, hematemesis  Physical Exam  There were no vitals taken for this visit. Gen:   Awake, no distress   Resp:  Prolonged expiratory phase.  Inspiratory and expiratory wheezing diffuse throughout all lung fields. MSK:   Moves extremities without difficulty  Other:    Medical Decision Making  Medically screening exam initiated at 9:53 AM.  Appropriate orders placed.  Lori Owens was informed that the remainder of the evaluation will be completed by another provider, this initial triage assessment does not replace that evaluation, and the importance of remaining in the ED until their evaluation is complete.  Shortness of breath work-up.    Lori Raring, PA-C 09/05/21 5056    Lori Biles, MD 09/05/21 1346

## 2021-09-05 NOTE — H&P (Signed)
History and Physical  Lori Owens ZCH:[id number] DOB: [birth year] DOA: 09/05/2021  Referring physician: Dr. Almyra Free, [facility]  PCP: Lori Shave, MD  Outpatient Specialists: Pulmonology Patient coming from: Home.  Chief Complaint: Shortness of breath, productive cough  HPI: Lori Owens is a 60 y.o. female with medical history significant for COPD not on oxygen supplementation at baseline, ongoing tobacco use disorder, asthma, chronic anxiety/depression, who presented to [facility] ED with complaints of worsening shortness of breath and a productive cough x1 week duration.  No subjective fevers or chills.  No chest pain.  She has cut down on tobacco use, now smokes a few cigarettes a day.  Upon presentation to the ED O2 saturation 90% on room air.  Placed on 4 L nasal cannula however she has nail polish which can preclude the accuracy of pulse oximeter.  She received albuterol nebs and 1 dose of prednisone 60 mg.  Admitted by hospitalist service.  ED Course:  Temperature 97.7.  BP 129/87, pulse 86, respiration rate 17, saturation 91% on 4 L.  CBC and BMP essentially unremarkable.  BNP normal.  Review of Systems: Review of systems as noted in the HPI. All other systems reviewed and are negative.   Past Medical History:  Diagnosis Date   Asthma    COPD (chronic obstructive pulmonary disease) ([place])    Depression 01/18/2013   Headache(784.0)    Heart murmur    Melanoma ([place])    Seasonal allergies    Tobacco abuse 01/18/2013   Past Surgical History:  Procedure Laterality Date   ABDOMINAL HYSTERECTOMY     NOSE SURGERY     polyp removal from nasal cavity    THROAT SURGERY     TONSILLECTOMY      Social History:  reports that she has quit smoking. Her smoking use included cigarettes. She has a 20.00 pack-year smoking history. She has never used smokeless tobacco. She reports current alcohol use. She reports current drug use. Drug: Marijuana.   Allergies  Allergen Reactions   Dust Mite Extract  Shortness Of Breath   Cymbalta [Duloxetine Hcl] Other (See Comments)    Suicidal   Escitalopram Oxalate Other (See Comments)    Suicidal.   Other Other (See Comments)    Pt is highly allergic to cats/dogs - causes shortness of breath    Family History  Problem Relation Age of Onset   Diabetes Maternal Grandfather    Heart disease Maternal Grandfather    Cancer Maternal Grandfather    Hypertension Maternal Grandfather       Prior to Admission medications   Medication Sig Start Date End Date Taking? Authorizing Provider  albuterol (PROVENTIL) (2.5 MG/3ML) 0.083% nebulizer solution TAKE 3 MLS BY NEBULIZATION EVERY 6 HOURS AS NEEDED FOR WHEEZING OR SHORTNESS OF BREATH. Patient taking differently: Take 2.5 mg by nebulization every 6 (six) hours as needed for wheezing or shortness of breath. 06/08/21  Yes Lori Shave, MD  Cholecalciferol 5000 units capsule Take 5,000 Units by mouth every morning. 05/30/15  Yes [provider]  fluticasone (FLONASE) 50 MCG/ACT nasal spray Place 2 sprays into both nostrils daily as needed for allergies or rhinitis. 01/24/20  Yes Lori Shave, MD  Fluticasone-Umeclidin-Vilant (TRELEGY ELLIPTA) 100-62.5-25 MCG/INH AEPB Inhale 1 puff into the lungs daily. 08/04/21  Yes Freddi Starr, MD  montelukast (SINGULAIR) 10 MG tablet TAKE 1 TABLET BY MOUTH EVERY DAY Patient taking differently: Take 10 mg by mouth at bedtime as needed (congestion). 06/08/21  Yes Lori Shave, MD  PARoxetine (  PAXIL) 40 MG tablet TAKE 1 TABLET BY MOUTH EVERY DAY IN THE MORNING Patient taking differently: Take 40 mg by mouth daily after breakfast. 08/07/21  Yes Lori Shave, MD  SUPER B COMPLEX/C PO Take 1 tablet by mouth every morning.   Yes [provider]  traZODone (DESYREL) 50 MG tablet TAKE 2 TABLETS (100 MG TOTAL) BY MOUTH AT BEDTIME AS NEEDED FOR SLEEP. Patient taking differently: Take 150 mg by mouth at bedtime. 07/31/21  Yes Lori Shave, MD   albuterol (VENTOLIN HFA) 108 (90 Base) MCG/ACT inhaler TAKE 2 PUFFS BY MOUTH EVERY 6 HOURS AS NEEDED FOR WHEEZE OR SHORTNESS OF BREATH Patient not taking: No sig reported 08/07/21   Lori Shave, MD    Physical Exam: BP 130/79   Pulse 99   Temp 97.7 F (36.5 C) (Oral)   Resp (!) 22   Ht 5\' 5"  (1.651 m)   Wt 74.8 kg   SpO2 91%   BMI 27.46 kg/m   General: 59 y.o. year-old female well developed well nourished in no acute distress.  Alert and oriented x3. Cardiovascular: Regular rate and rhythm with no rubs or gallops.  No thyromegaly or JVD noted.  No lower extremity edema. 2/4 pulses in all 4 extremities. Respiratory: Diffuse wheezing bilaterally. Good inspiratory effort. Abdomen: Soft nontender nondistended with normal bowel sounds x4 quadrants. Muskuloskeletal: No cyanosis, clubbing or edema noted bilaterally Neuro: CN II-XII intact, strength, sensation, reflexes Skin: No ulcerative lesions noted or rashes Psychiatry: Judgement and insight appear normal. Mood is appropriate for condition and setting          Labs on Admission:  Basic Metabolic Panel: Recent Labs  Lab 09/05/21 1028  NA 141  K 4.2  CL 109  CO2 23  GLUCOSE 92  BUN 20  CREATININE 0.58  CALCIUM 9.7   Liver Function Tests: Recent Labs  Lab 09/05/21 1028  AST 18  ALT 20  ALKPHOS 78  BILITOT 0.8  PROT 7.5  ALBUMIN 4.2   No results for input(s): LIPASE, AMYLASE in the last 168 hours. No results for input(s): AMMONIA in the last 168 hours. CBC: Recent Labs  Lab 09/05/21 1028  WBC 5.9  NEUTROABS 3.8  HGB 14.2  HCT 42.5  MCV 93.2  PLT 271   Cardiac Enzymes: No results for input(s): CKTOTAL, CKMB, CKMBINDEX, TROPONINI in the last 168 hours.  BNP (last 3 results) Recent Labs    09/05/21 1028  BNP 17.9    ProBNP (last 3 results) No results for input(s): PROBNP in the last 8760 hours.  CBG: No results for input(s): GLUCAP in the last 168 hours.  Radiological Exams on  Admission: DG Chest 2 View  Result Date: 09/05/2021 CLINICAL DATA:  Shortness of breath EXAM: CHEST - 2 VIEW COMPARISON:  05/26/2021 FINDINGS: Normal heart size and mediastinal contours. No acute infiltrate or edema. No effusion or pneumothorax. No acute osseous findings. IMPRESSION: No active cardiopulmonary disease. Electronically Signed   By: Jorje Guild M.D.   On: 09/05/2021 10:59    EKG: I independently viewed the EKG done and my findings are as followed: Sinus rhythm rate of 97.  Nonspecific ST-T changes.  QTc 430.  Assessment/Plan Present on Admission:  COPD exacerbation (HCC)  Active Problems:   COPD exacerbation ([facility])  COPD/asthma exacerbation, suspect contributed by ongoing tobacco use COVID-19 screening test negative, influenza A&B negative. Received nebs treatment and 1 dose of 60 mg prednisone in the ED. Resume home bronchodilators.  She follows with pulmonary  outpatient. Start IV Solu-Medrol 40 mg twice daily, DuoNebs every 6 hours, Z-Pak. Start pulmonary toilet hypersaline nebs twice daily x3 days and Mucinex 1200 mg twice daily x3 days. Incentive spirometer Mobilize as tolerated   Questionable hypoxia Upon presentation to the ED O2 saturation 90% on room air.  Placed on 4 L nasal cannula however she has nail polish which can preclude the accuracy of pulse oximeter.  Can use earlobe. Obtain home oxygen evaluation prior to DC  Tobacco use disorder Tobacco cessation counseling done at bedside. Nicotine patch.  Chronic anxiety/depression Resume home regimen, Paxil and trazodone    DVT prophylaxis: Subcu Lovenox daily  Code Status: Full code  Family Communication: None at bedside  Disposition Plan: Admitted to [facility]  Consults called: None  Admission status: Observation status   Status is: Observation       Kayleen Memos MD [facility] Pager [phone]  If 7PM-7AM, please contact night-coverage [email or web address] Password  [email or web address]  09/05/2021, 11:41 PM

## 2021-09-05 NOTE — ED Notes (Signed)
Respiratory called for continuous neb 

## 2021-09-05 NOTE — ED Triage Notes (Signed)
Patient reports that she has a history of COPD. Patient c/o increased SOB and a cough x 1 1/2 weeks. Patient states she ran out of her Albuterol inhaler.

## 2021-09-06 DIAGNOSIS — Z833 Family history of diabetes mellitus: Secondary | ICD-10-CM | POA: Diagnosis not present

## 2021-09-06 DIAGNOSIS — Z79899 Other long term (current) drug therapy: Secondary | ICD-10-CM | POA: Diagnosis not present

## 2021-09-06 DIAGNOSIS — F419 Anxiety disorder, unspecified: Secondary | ICD-10-CM | POA: Diagnosis present

## 2021-09-06 DIAGNOSIS — F32A Depression, unspecified: Secondary | ICD-10-CM | POA: Diagnosis present

## 2021-09-06 DIAGNOSIS — Z716 Tobacco abuse counseling: Secondary | ICD-10-CM | POA: Diagnosis not present

## 2021-09-06 DIAGNOSIS — F17210 Nicotine dependence, cigarettes, uncomplicated: Secondary | ICD-10-CM | POA: Diagnosis present

## 2021-09-06 DIAGNOSIS — Z720 Tobacco use: Secondary | ICD-10-CM

## 2021-09-06 DIAGNOSIS — Z8249 Family history of ischemic heart disease and other diseases of the circulatory system: Secondary | ICD-10-CM | POA: Diagnosis not present

## 2021-09-06 DIAGNOSIS — Z85820 Personal history of malignant melanoma of skin: Secondary | ICD-10-CM | POA: Diagnosis not present

## 2021-09-06 DIAGNOSIS — Z90710 Acquired absence of both cervix and uterus: Secondary | ICD-10-CM | POA: Diagnosis not present

## 2021-09-06 DIAGNOSIS — J209 Acute bronchitis, unspecified: Secondary | ICD-10-CM | POA: Diagnosis present

## 2021-09-06 DIAGNOSIS — Z888 Allergy status to other drugs, medicaments and biological substances status: Secondary | ICD-10-CM | POA: Diagnosis not present

## 2021-09-06 DIAGNOSIS — J440 Chronic obstructive pulmonary disease with acute lower respiratory infection: Secondary | ICD-10-CM | POA: Diagnosis present

## 2021-09-06 DIAGNOSIS — J441 Chronic obstructive pulmonary disease with (acute) exacerbation: Secondary | ICD-10-CM | POA: Diagnosis present

## 2021-09-06 DIAGNOSIS — Z20822 Contact with and (suspected) exposure to covid-19: Secondary | ICD-10-CM | POA: Diagnosis present

## 2021-09-06 LAB — CBC
HCT: 41.9 % (ref 36.0–46.0)
HCT: 38.5 % (ref 36.0–46.0)
Hemoglobin: 13.9 g/dL (ref 12.0–15.0)
Hemoglobin: 13 g/dL (ref 12.0–15.0)
MCH: 30.9 pg (ref 26.0–34.0)
MCH: 31.3 pg (ref 26.0–34.0)
MCHC: 33.2 g/dL (ref 30.0–36.0)
MCHC: 33.8 g/dL (ref 30.0–36.0)
MCV: 93.1 fL (ref 80.0–100.0)
MCV: 92.5 fL (ref 80.0–100.0)
Platelets: 273 10*3/uL (ref 150–400)
Platelets: 253 10*3/uL (ref 150–400)
RBC: 4.5 MIL/uL (ref 3.87–5.11)
RBC: 4.16 MIL/uL (ref 3.87–5.11)
RDW: 12.2 % (ref 11.5–15.5)
RDW: 11.9 % (ref 11.5–15.5)
WBC: 7.3 10*3/uL (ref 4.0–10.5)
WBC: 4.9 10*3/uL (ref 4.0–10.5)
nRBC: 0 % (ref 0.0–0.2)
nRBC: 0 % (ref 0.0–0.2)

## 2021-09-06 LAB — HIV ANTIBODY (ROUTINE TESTING W REFLEX): HIV Screen 4th Generation wRfx: NONREACTIVE

## 2021-09-06 LAB — BASIC METABOLIC PANEL
Anion gap: 5 (ref 5–15)
BUN: 19 mg/dL (ref 6–20)
CO2: 25 mmol/L (ref 22–32)
Calcium: 9.2 mg/dL (ref 8.9–10.3)
Chloride: 108 mmol/L (ref 98–111)
Creatinine, Ser: 0.64 mg/dL (ref 0.44–1.00)
GFR, Estimated: >60 mL/min (ref >60)
Glucose, Bld: 114 mg/dL — ABNORMAL HIGH (ref 70–99)
Potassium: 4.2 mmol/L (ref 3.5–5.1)
Sodium: 138 mmol/L (ref 135–145)

## 2021-09-06 LAB — PHOSPHORUS: Phosphorus: 4.2 mg/dL (ref 2.5–4.6)

## 2021-09-06 LAB — CREATININE, SERUM
Creatinine, Ser: 0.81 mg/dL (ref 0.44–1.00)
GFR, Estimated: >60 mL/min (ref >60)

## 2021-09-06 LAB — MAGNESIUM: Magnesium: 2 mg/dL (ref 1.7–2.4)

## 2021-09-06 MED ORDER — UMECLIDINIUM BROMIDE 62.5 MCG/ACT IN AEPB
1.0000 | INHALATION_SPRAY | Freq: Every day | RESPIRATORY_TRACT | Status: DC
  Administered 2021-09-06 – 2021-09-08 (×2): 1 via RESPIRATORY_TRACT
  Filled 2021-09-06: qty 7

## 2021-09-06 MED ORDER — FLUTICASONE-UMECLIDIN-VILANT 100-62.5-25 MCG/ACT IN AEPB
1.0000 | INHALATION_SPRAY | Freq: Every day | RESPIRATORY_TRACT | Status: DC
Start: 2021-09-06 — End: 2021-09-06

## 2021-09-06 MED ORDER — FLUTICASONE PROPIONATE 50 MCG/ACT NA SUSP
2.0000 | Freq: Every day | NASAL | Status: DC | PRN
  Filled 2021-09-06: qty 16

## 2021-09-06 MED ORDER — MONTELUKAST SODIUM 10 MG PO TABS
10.0000 mg | ORAL_TABLET | Freq: Every day | ORAL | Status: DC
  Administered 2021-09-07 – 2021-09-08 (×2): 10 mg via ORAL
  Filled 2021-09-06 (×3): qty 1

## 2021-09-06 MED ORDER — FLUTICASONE FUROATE-VILANTEROL 100-25 MCG/ACT IN AEPB
1.0000 | INHALATION_SPRAY | Freq: Every day | RESPIRATORY_TRACT | Status: DC
  Administered 2021-09-06 – 2021-09-08 (×2): 1 via RESPIRATORY_TRACT
  Filled 2021-09-06: qty 28

## 2021-09-06 MED ORDER — ENOXAPARIN SODIUM 40 MG/0.4ML IJ SOSY
40.0000 mg | PREFILLED_SYRINGE | Freq: Every day | INTRAMUSCULAR | Status: DC
  Administered 2021-09-06 – 2021-09-07 (×3): 40 mg via SUBCUTANEOUS
  Filled 2021-09-06 (×3): qty 0.4

## 2021-09-06 MED ORDER — TRAZODONE HCL 100 MG PO TABS
100.0000 mg | ORAL_TABLET | Freq: Every evening | ORAL | Status: DC | PRN
  Administered 2021-09-06 – 2021-09-07 (×3): 100 mg via ORAL
  Filled 2021-09-06 (×3): qty 1

## 2021-09-06 MED ORDER — SODIUM CHLORIDE 3 % IN NEBU
4.0000 mL | INHALATION_SOLUTION | Freq: Two times a day (BID) | RESPIRATORY_TRACT | Status: DC
  Administered 2021-09-06 – 2021-09-08 (×4): 4 mL via RESPIRATORY_TRACT
  Filled 2021-09-06 (×5): qty 4

## 2021-09-06 MED ORDER — SODIUM CHLORIDE 0.9 % IV SOLN: INTRAVENOUS | Status: DC

## 2021-09-06 MED ORDER — PAROXETINE HCL 20 MG PO TABS
40.0000 mg | ORAL_TABLET | Freq: Every day | ORAL | Status: DC
  Administered 2021-09-06 – 2021-09-08 (×3): 40 mg via ORAL
  Filled 2021-09-06 (×4): qty 2

## 2021-09-06 NOTE — Progress Notes (Addendum)
PROGRESS NOTE    Lori Owens   EVO:[id number]  DOB: 1961/06/23  DOA: 09/05/2021 PCP: Gifford Shave, MD   Brief Narrative:  Lori Owens is a 60 y.o. female with medical history significant for COPD not on oxygen supplementation at baseline, ongoing tobacco use disorder, asthma, chronic anxiety/depression, who presented to [facility] ED with complaints of worsening shortness of breath and a productive cough x1 week duration.  No subjective fevers or chills.  No chest pain.  She has cut down on tobacco use, now smokes a few cigarettes a day.  Upon presentation to the ED O2 saturation 90% on room air.  Placed on 4 L nasal cannula however she has nail polish which can preclude the accuracy of pulse oximeter.  She received albuterol nebs and 1 dose of prednisone 60 mg.    Subjective: She is still short of breath and coughing.     Assessment & Plan:   Principal Problem:   COPD exacerbation ([place]) - still is wheezing today- has an ongoing congested cough but is unable to cough the sputum up - cont Solumedrol, Nebs including hypertonic nebs, Mucinex and Singulair - start normal saline to help liquify sputum   Active Problems:   Tobacco abuse - have advised her to avoid smoking    Anxiety and depression . Paxil  Time spent in minutes: 35 DVT prophylaxis: enoxaparin (LOVENOX) injection 40 mg Start: 09/06/21 0045  Code Status: Full code Family Communication:  Level of Care: Level of care: Med-Surg Disposition Plan:  Status is: Observation  The patient will require care spanning > 2 midnights and should be moved to inpatient because: ongoing dyspnea and wheezing    Consultants:  none Procedures:  none Antimicrobials:  Anti-infectives (From admission, onward)    Start     Dose/Rate Route Frequency Ordered Stop   09/07/21 1000  azithromycin (ZITHROMAX) tablet 250 mg       See Hyperspace for full Linked Orders Report.   250 mg Oral Daily 09/05/21 2349 09/11/21 0959   09/06/21  1000  azithromycin (ZITHROMAX) tablet 500 mg       See Hyperspace for full Linked Orders Report.   500 mg Oral Daily 09/05/21 2349 09/06/21 0921        Objective: Vitals:   09/06/21 0703 09/06/21 0800 09/06/21 1103 09/06/21 1254  BP:  109/72 128/87 (!) 141/99  Pulse:  65 92 95  Resp:  18 19 16   Temp:    97.7 F (36.5 C)  TempSrc:    Oral  SpO2: 91% 91% 92% 100%  Weight:      Height:       No intake or output data in the 24 hours ending 09/06/21 1318 Filed Weights   09/05/21 1016  Weight: 74.8 kg    Examination: General exam: Appears comfortable  HEENT: PERRLA, oral mucosa moist, no sclera icterus or thrush Respiratory system: wheezing in b/l lung fields- cough which sounds congested Cardiovascular system: S1 & S2 heard, RRR.   Gastrointestinal system: Abdomen soft, non-tender, nondistended. Normal bowel sounds. Central nervous system: Alert and oriented. No focal neurological deficits. Extremities: No cyanosis, clubbing or edema Skin: No rashes or ulcers Psychiatry:  Mood & affect appropriate.     Data Reviewed: I have personally reviewed following labs and imaging studies  CBC: Recent Labs  Lab 09/05/21 1028 09/06/21 0049 09/06/21 0642  WBC 5.9 7.3 4.9  NEUTROABS 3.8  --   --   HGB 14.2 13.9 13.0  HCT 42.5 41.9 38.5  MCV 93.2 93.1 92.5  PLT 271 273 673   Basic Metabolic Panel: Recent Labs  Lab 09/05/21 1028 09/06/21 0049 09/06/21 0642  NA 141  --  138  K 4.2  --  4.2  CL 109  --  108  CO2 23  --  25  GLUCOSE 92  --  114*  BUN 20  --  19  CREATININE 0.58 0.81 0.64  CALCIUM 9.7  --  9.2  MG  --   --  2.0  PHOS  --   --  4.2   GFR: Estimated Creatinine Clearance: 76.6 mL/min (by C-G formula based on SCr of 0.64 mg/dL). Liver Function Tests: Recent Labs  Lab 09/05/21 1028  AST 18  ALT 20  ALKPHOS 78  BILITOT 0.8  PROT 7.5  ALBUMIN 4.2   No results for input(s): LIPASE, AMYLASE in the last 168 hours. No results for input(s): AMMONIA in  the last 168 hours. Coagulation Profile: No results for input(s): INR, PROTIME in the last 168 hours. Cardiac Enzymes: No results for input(s): CKTOTAL, CKMB, CKMBINDEX, TROPONINI in the last 168 hours. BNP (last 3 results) No results for input(s): PROBNP in the last 8760 hours. HbA1C: No results for input(s): HGBA1C in the last 72 hours. CBG: No results for input(s): GLUCAP in the last 168 hours. Lipid Profile: No results for input(s): CHOL, HDL, LDLCALC, TRIG, CHOLHDL, LDLDIRECT in the last 72 hours. Thyroid Function Tests: No results for input(s): TSH, T4TOTAL, FREET4, T3FREE, THYROIDAB in the last 72 hours. Anemia Panel: No results for input(s): VITAMINB12, FOLATE, FERRITIN, TIBC, IRON, RETICCTPCT in the last 72 hours. Urine analysis:    Component Value Date/Time   COLORURINE YELLOW 10/12/2019 1804   APPEARANCEUR CLEAR 10/12/2019 1804   LABSPEC >1.046 (H) 10/12/2019 1804   PHURINE 5.0 10/12/2019 1804   GLUCOSEU NEGATIVE 10/12/2019 1804   HGBUR NEGATIVE 10/12/2019 1804   BILIRUBINUR NEGATIVE 10/12/2019 1804   KETONESUR 5 (A) 10/12/2019 1804   PROTEINUR NEGATIVE 10/12/2019 1804   UROBILINOGEN 0.2 01/30/2015 2221   NITRITE NEGATIVE 10/12/2019 1804   LEUKOCYTESUR NEGATIVE 10/12/2019 1804   Sepsis Labs: @LABRCNTIP (procalcitonin:4,lacticidven:4) ) Recent Results (from the past 240 hour(s))  Resp Panel by RT-PCR (Flu A&B, Covid) Nasopharyngeal Swab     Status: None   Collection Time: 09/05/21 10:32 AM   Specimen: Nasopharyngeal Swab; Nasopharyngeal(NP) swabs in vial transport medium  Result Value Ref Range Status   SARS Coronavirus 2 by RT PCR NEGATIVE NEGATIVE Final    Comment: (NOTE) SARS-CoV-2 target nucleic acids are NOT DETECTED.  The SARS-CoV-2 RNA is generally detectable in upper respiratory specimens during the acute phase of infection. The lowest concentration of SARS-CoV-2 viral copies this assay can detect is 138 copies/mL. A negative result does not preclude  SARS-Cov-2 infection and should not be used as the sole basis for treatment or other patient management decisions. A negative result may occur with  improper specimen collection/handling, submission of specimen other than nasopharyngeal swab, presence of viral mutation(s) within the areas targeted by this assay, and inadequate number of viral copies(<138 copies/mL). A negative result must be combined with clinical observations, patient history, and epidemiological information. The expected result is Negative.  Fact Sheet for Patients:  EntrepreneurPulse.com.au  Fact Sheet for Healthcare Providers:  IncredibleEmployment.be  This test is no t yet approved or cleared by the [place] FDA and  has been authorized for detection and/or diagnosis of SARS-CoV-2 by FDA under an Emergency Use Authorization (EUA). This EUA will remain  in effect (meaning this test can be used) for the duration of the COVID-19 declaration under Section 564(b)(1) of the Act, 21 U.S.C.section 360bbb-3(b)(1), unless the authorization is terminated  or revoked sooner.       Influenza A by PCR NEGATIVE NEGATIVE Final   Influenza B by PCR NEGATIVE NEGATIVE Final    Comment: (NOTE) The Xpert Xpress SARS-CoV-2/FLU/RSV plus assay is intended as an aid in the diagnosis of influenza from Nasopharyngeal swab specimens and should not be used as a sole basis for treatment. Nasal washings and aspirates are unacceptable for Xpert Xpress SARS-CoV-2/FLU/RSV testing.  Fact Sheet for Patients: EntrepreneurPulse.com.au  Fact Sheet for Healthcare Providers: IncredibleEmployment.be  This test is not yet approved or cleared by the [place] FDA and has been authorized for detection and/or diagnosis of SARS-CoV-2 by FDA under an Emergency Use Authorization (EUA). This EUA will remain in effect (meaning this test can be used) for the duration of  the COVID-19 declaration under Section 564(b)(1) of the Act, 21 U.S.C. section 360bbb-3(b)(1), unless the authorization is terminated or revoked.  Performed at [facility], [place] [address] [place], [place] [address]          Radiology Studies: DG Chest 2 View  Result Date: 09/05/2021 CLINICAL DATA:  Shortness of breath EXAM: CHEST - 2 VIEW COMPARISON:  05/26/2021 FINDINGS: Normal heart size and mediastinal contours. No acute infiltrate or edema. No effusion or pneumothorax. No acute osseous findings. IMPRESSION: No active cardiopulmonary disease. Electronically Signed   By: Jorje Guild M.D.   On: 09/05/2021 10:59      Scheduled Meds:  [START ON 09/07/2021] azithromycin  250 mg Oral Daily   enoxaparin (LOVENOX) injection  40 mg Subcutaneous QHS   fluticasone furoate-vilanterol  1 puff Inhalation Daily   And   umeclidinium bromide  1 puff Inhalation Daily   guaiFENesin  1,200 mg Oral BID   ipratropium-albuterol  3 mL Nebulization Q6H   methylPREDNISolone (SOLU-MEDROL) injection  80 mg Intravenous Q24H   montelukast  10 mg Oral Daily   PARoxetine  40 mg Oral QPC breakfast   sodium chloride HYPERTONIC  4 mL Nebulization BID   Continuous Infusions:  sodium chloride 75 mL/hr at 09/06/21 1309     LOS: 0 days      Debbe Odea, MD [facility] Pager: [email or web address] 09/06/2021, 1:18 PM

## 2021-09-06 NOTE — Progress Notes (Signed)
Took pt for a walk around the floor without O2 suppliment. Spo2 remained at 96% without SOB and Dyspnea.

## 2021-09-06 NOTE — Plan of Care (Signed)
?  Problem: Clinical Measurements: ?Goal: Ability to maintain clinical measurements within normal limits will improve ?Outcome: Progressing ?  ?Problem: Clinical Measurements: ?Goal: Will remain free from infection ?Outcome: Progressing ?  ?Problem: Clinical Measurements: ?Goal: Respiratory complications will improve ?Outcome: Progressing ?  ?

## 2021-09-06 NOTE — ED Notes (Signed)
Patient oxygen saturations 96% on 4L via [place].  Weaned down to 2L to see how she tolerates.

## 2021-09-07 ENCOUNTER — Telehealth: Payer: Self-pay | Admitting: Pulmonary Disease

## 2021-09-07 DIAGNOSIS — J441 Chronic obstructive pulmonary disease with (acute) exacerbation: Secondary | ICD-10-CM | POA: Diagnosis not present

## 2021-09-07 MED ORDER — IPRATROPIUM-ALBUTEROL 0.5-2.5 (3) MG/3ML IN SOLN
3.0000 mL | Freq: Three times a day (TID) | RESPIRATORY_TRACT | Status: DC
  Administered 2021-09-08: 3 mL via RESPIRATORY_TRACT
  Filled 2021-09-07: qty 3

## 2021-09-07 MED ORDER — ALBUTEROL SULFATE (2.5 MG/3ML) 0.083% IN NEBU
2.5000 mg | INHALATION_SOLUTION | RESPIRATORY_TRACT | Status: DC | PRN
  Administered 2021-09-08: 2.5 mg via RESPIRATORY_TRACT
  Filled 2021-09-07: qty 3

## 2021-09-07 NOTE — Telephone Encounter (Signed)
The treatments provided by the hospital team will help the cough and sputum production clear up with time. She should continue her trelegy ellipta once out of the hospital along with as needed nebulizer treatments. They can consider using a flutter valve while in the hospital to help clear the mucous with nebulizer treatments.   Hope she feels better soon.  Dr. Erin Fulling

## 2021-09-07 NOTE — Telephone Encounter (Signed)
Lm for patient.  

## 2021-09-07 NOTE — Progress Notes (Signed)
PROGRESS NOTE    Lori Owens   IHK:[id number]  DOB: Nov 05, 1961  DOA: 09/05/2021 PCP: Gifford Shave, MD   Brief Narrative:  Lori Owens is a 60 y.o. female with medical history significant for COPD not on oxygen supplementation at baseline, ongoing tobacco use disorder, asthma, chronic anxiety/depression, who presented to [facility] ED with complaints of worsening shortness of breath and a productive cough x1 week duration.  No subjective fevers or chills.  No chest pain.  She has cut down on tobacco use, now smokes a few cigarettes a day.  Upon presentation to the ED O2 saturation 90% on room air.  Placed on 4 L nasal cannula however she has nail polish which can preclude the accuracy of pulse oximeter.  She received albuterol nebs and 1 dose of prednisone 60 mg.    Subjective: She is still short of breath and coughing.     Assessment & Plan:   Principal Problem:   COPD exacerbation ([place]) - still is wheezing today- has an ongoing congested cough but is unable to cough the sputum up - cont Solumedrol, Nebs including hypertonic nebs, Mucinex and Singulair - added flutter valve to help mobilize sputum- added IS as well  Active Problems:   Tobacco abuse - have advised her to avoid smoking    Anxiety and depression . Paxil  Time spent in minutes: 35 DVT prophylaxis: enoxaparin (LOVENOX) injection 40 mg Start: 09/06/21 0045  Code Status: Full code Family Communication:  Level of Care: Level of care: Med-Surg Disposition Plan:  Status is: Observation  The patient will require care spanning > 2 midnights and should be moved to inpatient because: ongoing dyspnea and wheezing    Consultants:  none Procedures:  none Antimicrobials:  Anti-infectives (From admission, onward)    Start     Dose/Rate Route Frequency Ordered Stop   09/07/21 1000  azithromycin (ZITHROMAX) tablet 250 mg       See Hyperspace for full Linked Orders Report.   250 mg Oral Daily 09/05/21 2349 09/11/21  0959   09/06/21 1000  azithromycin (ZITHROMAX) tablet 500 mg       See Hyperspace for full Linked Orders Report.   500 mg Oral Daily 09/05/21 2349 09/06/21 0921        Objective: Vitals:   09/06/21 2214 09/07/21 0133 09/07/21 0624 09/07/21 0756  BP: 118/77  (!) 148/95   Pulse: 88  72   Resp: 16  16   Temp: 98 F (36.7 C)  98.2 F (36.8 C)   TempSrc: Oral  Oral   SpO2: 98% 95% 97% 94%  Weight:      Height:        Intake/Output Summary (Last 24 hours) at 09/07/2021 1334 Last data filed at 09/07/2021 1226 Gross per 24 hour  Intake 2449.57 ml  Output --  Net 2449.57 ml   Filed Weights   09/05/21 1016  Weight: 74.8 kg    Examination: General exam: Appears comfortable  HEENT: PERRLA, oral mucosa moist, no sclera icterus or thrush Respiratory system: b/l wheeze and rhonchi- has a cough Cardiovascular system: S1 & S2 heard, regular rate and rhythm Gastrointestinal system: Abdomen soft, non-tender, nondistended. Normal bowel sounds   Central nervous system: Alert and oriented. No focal neurological deficits. Extremities: No cyanosis, clubbing or edema Skin: No rashes or ulcers Psychiatry:  Mood & affect appropriate.      Data Reviewed: I have personally reviewed following labs and imaging studies  CBC: Recent Labs  Lab 09/05/21 1028  09/06/21 0049 09/06/21 0642  WBC 5.9 7.3 4.9  NEUTROABS 3.8  --   --   HGB 14.2 13.9 13.0  HCT 42.5 41.9 38.5  MCV 93.2 93.1 92.5  PLT 271 273 518    Basic Metabolic Panel: Recent Labs  Lab 09/05/21 1028 09/06/21 0049 09/06/21 0642  NA 141  --  138  K 4.2  --  4.2  CL 109  --  108  CO2 23  --  25  GLUCOSE 92  --  114*  BUN 20  --  19  CREATININE 0.58 0.81 0.64  CALCIUM 9.7  --  9.2  MG  --   --  2.0  PHOS  --   --  4.2    GFR: Estimated Creatinine Clearance: 76.6 mL/min (by C-G formula based on SCr of 0.64 mg/dL). Liver Function Tests: Recent Labs  Lab 09/05/21 1028  AST 18  ALT 20  ALKPHOS 78  BILITOT 0.8   PROT 7.5  ALBUMIN 4.2    No results for input(s): LIPASE, AMYLASE in the last 168 hours. No results for input(s): AMMONIA in the last 168 hours. Coagulation Profile: No results for input(s): INR, PROTIME in the last 168 hours. Cardiac Enzymes: No results for input(s): CKTOTAL, CKMB, CKMBINDEX, TROPONINI in the last 168 hours. BNP (last 3 results) No results for input(s): PROBNP in the last 8760 hours. HbA1C: No results for input(s): HGBA1C in the last 72 hours. CBG: No results for input(s): GLUCAP in the last 168 hours. Lipid Profile: No results for input(s): CHOL, HDL, LDLCALC, TRIG, CHOLHDL, LDLDIRECT in the last 72 hours. Thyroid Function Tests: No results for input(s): TSH, T4TOTAL, FREET4, T3FREE, THYROIDAB in the last 72 hours. Anemia Panel: No results for input(s): VITAMINB12, FOLATE, FERRITIN, TIBC, IRON, RETICCTPCT in the last 72 hours. Urine analysis:    Component Value Date/Time   COLORURINE YELLOW 10/12/2019 1804   APPEARANCEUR CLEAR 10/12/2019 1804   LABSPEC >1.046 (H) 10/12/2019 1804   PHURINE 5.0 10/12/2019 1804   GLUCOSEU NEGATIVE 10/12/2019 1804   HGBUR NEGATIVE 10/12/2019 1804   BILIRUBINUR NEGATIVE 10/12/2019 1804   KETONESUR 5 (A) 10/12/2019 1804   PROTEINUR NEGATIVE 10/12/2019 1804   UROBILINOGEN 0.2 01/30/2015 2221   NITRITE NEGATIVE 10/12/2019 1804   LEUKOCYTESUR NEGATIVE 10/12/2019 1804   Sepsis Labs: @LABRCNTIP (procalcitonin:4,lacticidven:4) ) Recent Results (from the past 240 hour(s))  Resp Panel by RT-PCR (Flu A&B, Covid) Nasopharyngeal Swab     Status: None   Collection Time: 09/05/21 10:32 AM   Specimen: Nasopharyngeal Swab; Nasopharyngeal(NP) swabs in vial transport medium  Result Value Ref Range Status   SARS Coronavirus 2 by RT PCR NEGATIVE NEGATIVE Final    Comment: (NOTE) SARS-CoV-2 target nucleic acids are NOT DETECTED.  The SARS-CoV-2 RNA is generally detectable in upper respiratory specimens during the acute phase of infection.  The lowest concentration of SARS-CoV-2 viral copies this assay can detect is 138 copies/mL. A negative result does not preclude SARS-Cov-2 infection and should not be used as the sole basis for treatment or other patient management decisions. A negative result may occur with  improper specimen collection/handling, submission of specimen other than nasopharyngeal swab, presence of viral mutation(s) within the areas targeted by this assay, and inadequate number of viral copies(<138 copies/mL). A negative result must be combined with clinical observations, patient history, and epidemiological information. The expected result is Negative.  Fact Sheet for Patients:  EntrepreneurPulse.com.au  Fact Sheet for Healthcare Providers:  IncredibleEmployment.be  This test is no t  yet approved or cleared by the [place] and  has been authorized for detection and/or diagnosis of SARS-CoV-2 by FDA under an Emergency Use Authorization (EUA). This EUA will remain  in effect (meaning this test can be used) for the duration of the COVID-19 declaration under Section 564(b)(1) of the Act, 21 U.S.C.section 360bbb-3(b)(1), unless the authorization is terminated  or revoked sooner.       Influenza A by PCR NEGATIVE NEGATIVE Final   Influenza B by PCR NEGATIVE NEGATIVE Final    Comment: (NOTE) The Xpert Xpress SARS-CoV-2/FLU/RSV plus assay is intended as an aid in the diagnosis of influenza from Nasopharyngeal swab specimens and should not be used as a sole basis for treatment. Nasal washings and aspirates are unacceptable for Xpert Xpress SARS-CoV-2/FLU/RSV testing.  Fact Sheet for Patients: EntrepreneurPulse.com.au  Fact Sheet for Healthcare Providers: IncredibleEmployment.be  This test is not yet approved or cleared by the [place] FDA and has been authorized for detection and/or diagnosis of SARS-CoV-2 by FDA  under an Emergency Use Authorization (EUA). This EUA will remain in effect (meaning this test can be used) for the duration of the COVID-19 declaration under Section 564(b)(1) of the Act, 21 U.S.C. section 360bbb-3(b)(1), unless the authorization is terminated or revoked.  Performed at [facility], [place] [address], [place], [place] [address]          Radiology Studies: No results found.    Scheduled Meds:  azithromycin  250 mg Oral Daily   enoxaparin (LOVENOX) injection  40 mg Subcutaneous QHS   fluticasone furoate-vilanterol  1 puff Inhalation Daily   And   umeclidinium bromide  1 puff Inhalation Daily   guaiFENesin  1,200 mg Oral BID   ipratropium-albuterol  3 mL Nebulization Q6H   methylPREDNISolone (SOLU-MEDROL) injection  80 mg Intravenous Q24H   montelukast  10 mg Oral Daily   PARoxetine  40 mg Oral QPC breakfast   sodium chloride HYPERTONIC  4 mL Nebulization BID   Continuous Infusions:  sodium chloride 75 mL/hr at 09/07/21 0255     LOS: 1 day      Debbe Odea, MD [facility] Pager: [email or web address] 09/07/2021, 1:34 PM

## 2021-09-07 NOTE — Telephone Encounter (Signed)
Patient is currently in the hospital for COPD Exacerbation and she is having some phlegm, cough, and congestion and she is still in the hospital at this time and is being treated for a COPD exacerbation and she reports he oxygen saturation are at 94%. She wants to keep you in the loop and see if you have any other recommendations other than what the hospital is treating her with. She is more concerned about getting her phlegm up. Please advise.

## 2021-09-07 NOTE — Telephone Encounter (Signed)
I called the patient and I gave her the recommendations per the provider and nothing further is needed.

## 2021-09-08 DIAGNOSIS — J440 Chronic obstructive pulmonary disease with acute lower respiratory infection: Secondary | ICD-10-CM | POA: Diagnosis not present

## 2021-09-08 DIAGNOSIS — J209 Acute bronchitis, unspecified: Secondary | ICD-10-CM | POA: Diagnosis not present

## 2021-09-08 MED ORDER — GUAIFENESIN ER 600 MG PO TB12: 1200.0000 mg | ORAL_TABLET | Freq: Two times a day (BID) | ORAL | 0 refills | Status: DC

## 2021-09-08 MED ORDER — SODIUM CHLORIDE 3 % IN NEBU: 4.0000 mL | INHALATION_SOLUTION | Freq: Two times a day (BID) | RESPIRATORY_TRACT | 12 refills | Status: DC

## 2021-09-08 MED ORDER — AZITHROMYCIN 250 MG PO TABS
250.0000 mg | ORAL_TABLET | Freq: Every day | ORAL | 0 refills | Status: DC
Start: 2021-09-08 — End: 2021-12-22

## 2021-09-08 MED ORDER — PREDNISONE 10 MG PO TABS: ORAL_TABLET | ORAL | 0 refills | Status: DC

## 2021-09-08 MED ORDER — IPRATROPIUM-ALBUTEROL 0.5-2.5 (3) MG/3ML IN SOLN: 3.0000 mL | RESPIRATORY_TRACT | 0 refills | Status: DC | PRN

## 2021-09-08 NOTE — Discharge Summary (Signed)
Physician Discharge Summary  Lori Owens IRW:[id number] DOB: [birth year] DOA: 09/05/2021  PCP: Gifford Shave, MD  Admit date: 09/05/2021 Discharge date: 09/08/2021  Admitted From: home  Disposition:  home   Recommendations for Outpatient Follow-up:  Continue to encourage to stop smoking  Home Health:  none  Discharge Condition:  stable   CODE STATUS:  Full code   Diet recommendation:  heart healthy Consultations: none  Procedures/Studies: none   Discharge Diagnoses:  Principal Problem:   COPD (chronic obstructive pulmonary disease) with acute bronchitis ([place]) Active Problems:   Tobacco abuse   Anxiety and depression     Brief Summary: Lori Owens is a 60 y.o. female with medical history significant for COPD not on oxygen supplementation at baseline, ongoing tobacco use disorder, asthma, chronic anxiety/depression, who presented to [facility] ED with complaints of worsening shortness of breath and a productive cough x1 week duration.  No subjective fevers or chills.  No chest pain.  She has cut down on tobacco use, now smokes a few cigarettes a day.  Upon presentation to the ED O2 saturation 90% on room air.  Placed on 4 L nasal cannula however she has nail polish which can preclude the accuracy of pulse oximeter.  She received albuterol nebs and 1 dose of prednisone 60 mg.   Hospital Course:  Principal Problem:   COPD exacerbation ([place])  - has an ongoing congested cough but wheezing has improved - treated with Solumedrol, Azithromycin, Nebs including hypertonic nebs, Mucinex and Singulair - added flutter valve to help mobilize sputum-  - have transitioned to Prednisone today    Active Problems:   Tobacco abuse - have advised her to avoid smoking     Anxiety and depression . Paxil   Discharge Exam: Vitals:   09/08/21 0813 09/08/21 0815  BP:    Pulse:    Resp:    Temp:    SpO2: 98% 98%   Vitals:   09/07/21 2000 09/08/21 0517 09/08/21 0813 09/08/21 0815  BP:  (!) 151/92 (!) 141/92    Pulse: 95 72    Resp: 17 17    Temp: 97.9 F (36.6 C) 98.5 F (36.9 C)    TempSrc: Oral     SpO2: 99% 97% 98% 98%  Weight:      Height:        General: Pt is alert, awake, not in acute distress Cardiovascular: RRR, S1/S2 +, no rubs, no gallops Respiratory: CTA bilaterally, no wheezing, no rhonchi Abdominal: Soft, NT, ND, bowel sounds + Extremities: no edema, no cyanosis   Discharge Instructions  Discharge Instructions     Diet - low sodium heart healthy   Complete by: As directed    For home use only DME Nebulizer machine   Complete by: As directed    Patient needs a nebulizer to treat with the following condition: COPD exacerbation (HCC)   Length of Need: Lifetime   Increase activity slowly   Complete by: As directed       Allergies as of 09/08/2021       Reactions   Dust Mite Extract Shortness Of Breath   Cymbalta [duloxetine Hcl] Other (See Comments)   Suicidal   Escitalopram Oxalate Other (See Comments)   Suicidal.   Other Other (See Comments)   Pt is highly allergic to cats/dogs - causes shortness of breath        Medication List     TAKE these medications    albuterol (2.5 MG/3ML) 0.083% nebulizer solution Commonly  known as: PROVENTIL TAKE 3 MLS BY NEBULIZATION EVERY 6 HOURS AS NEEDED FOR WHEEZING OR SHORTNESS OF BREATH. What changed:  how much to take how to take this when to take this reasons to take this additional instructions   albuterol 108 (90 Base) MCG/ACT inhaler Commonly known as: VENTOLIN HFA TAKE 2 PUFFS BY MOUTH EVERY 6 HOURS AS NEEDED FOR WHEEZE OR SHORTNESS OF BREATH What changed: Another medication with the same name was changed. Make sure you understand how and when to take each.   azithromycin 250 MG tablet Commonly known as: ZITHROMAX Take 1 tablet (250 mg total) by mouth daily.   Cholecalciferol 125 MCG (5000 UT) capsule Take 5,000 Units by mouth every morning.   fluticasone 50 MCG/ACT nasal  spray Commonly known as: FLONASE Place 2 sprays into both nostrils daily as needed for allergies or rhinitis.   guaiFENesin 600 MG 12 hr tablet Commonly known as: MUCINEX Take 2 tablets (1,200 mg total) by mouth 2 (two) times daily.   ipratropium-albuterol 0.5-2.5 (3) MG/3ML Soln Commonly known as: DUONEB Take 3 mLs by nebulization every 4 (four) hours as needed.   montelukast 10 MG tablet Commonly known as: SINGULAIR TAKE 1 TABLET BY MOUTH EVERY DAY What changed:  when to take this reasons to take this   PARoxetine 40 MG tablet Commonly known as: PAXIL TAKE 1 TABLET BY MOUTH EVERY DAY IN THE MORNING What changed: See the new instructions.   predniSONE 10 MG tablet Commonly known as: DELTASONE 60 mg tomorrow, taper by 10 mg daily until complete   sodium chloride HYPERTONIC 3 % nebulizer solution Take 4 mLs by nebulization 2 (two) times daily.   SUPER B COMPLEX/C PO Take 1 tablet by mouth every morning.   traZODone 50 MG tablet Commonly known as: DESYREL TAKE 2 TABLETS (100 MG TOTAL) BY MOUTH AT BEDTIME AS NEEDED FOR SLEEP. What changed:  how much to take when to take this   Trelegy Ellipta 100-62.5-25 MCG/ACT Aepb Generic drug: Fluticasone-Umeclidin-Vilant Inhale 1 puff into the lungs daily.               Durable Medical Equipment  (From admission, onward)           Start     Ordered   09/08/21 0000  For home use only DME Nebulizer machine       Question Answer Comment  Patient needs a nebulizer to treat with the following condition COPD exacerbation ([place])   Length of Need Lifetime      09/08/21 0752            Follow-up Information     [employer] .          [facility] Follow up.   Why: to provide home nebulizer machine Contact information: toll free # [phone]               Allergies  Allergen Reactions   Dust Mite Extract Shortness Of Breath   Cymbalta [Duloxetine Hcl] Other (See Comments)    Suicidal    Escitalopram Oxalate Other (See Comments)    Suicidal.   Other Other (See Comments)    Pt is highly allergic to cats/dogs - causes shortness of breath      DG Chest 2 View  Result Date: 09/05/2021 CLINICAL DATA:  Shortness of breath EXAM: CHEST - 2 VIEW COMPARISON:  05/26/2021 FINDINGS: Normal heart size and mediastinal contours. No acute infiltrate or edema. No effusion or pneumothorax. No acute osseous findings. IMPRESSION:  No active cardiopulmonary disease. Electronically Signed   By: Jorje Guild M.D.   On: 09/05/2021 10:59     The results of significant diagnostics from this hospitalization (including imaging, microbiology, ancillary and laboratory) are listed below for reference.     Microbiology: Recent Results (from the past 240 hour(s))  Resp Panel by RT-PCR (Flu A&B, Covid) Nasopharyngeal Swab     Status: None   Collection Time: 09/05/21 10:32 AM   Specimen: Nasopharyngeal Swab; Nasopharyngeal(NP) swabs in vial transport medium  Result Value Ref Range Status   SARS Coronavirus 2 by RT PCR NEGATIVE NEGATIVE Final    Comment: (NOTE) SARS-CoV-2 target nucleic acids are NOT DETECTED.  The SARS-CoV-2 RNA is generally detectable in upper respiratory specimens during the acute phase of infection. The lowest concentration of SARS-CoV-2 viral copies this assay can detect is 138 copies/mL. A negative result does not preclude SARS-Cov-2 infection and should not be used as the sole basis for treatment or other patient management decisions. A negative result may occur with  improper specimen collection/handling, submission of specimen other than nasopharyngeal swab, presence of viral mutation(s) within the areas targeted by this assay, and inadequate number of viral copies(<138 copies/mL). A negative result must be combined with clinical observations, patient history, and epidemiological information. The expected result is Negative.  Fact Sheet for Patients:   EntrepreneurPulse.com.au  Fact Sheet for Healthcare Providers:  IncredibleEmployment.be  This test is no t yet approved or cleared by the [place] FDA and  has been authorized for detection and/or diagnosis of SARS-CoV-2 by FDA under an Emergency Use Authorization (EUA). This EUA will remain  in effect (meaning this test can be used) for the duration of the COVID-19 declaration under Section 564(b)(1) of the Act, 21 U.S.C.section 360bbb-3(b)(1), unless the authorization is terminated  or revoked sooner.       Influenza A by PCR NEGATIVE NEGATIVE Final   Influenza B by PCR NEGATIVE NEGATIVE Final    Comment: (NOTE) The Xpert Xpress SARS-CoV-2/FLU/RSV plus assay is intended as an aid in the diagnosis of influenza from Nasopharyngeal swab specimens and should not be used as a sole basis for treatment. Nasal washings and aspirates are unacceptable for Xpert Xpress SARS-CoV-2/FLU/RSV testing.  Fact Sheet for Patients: EntrepreneurPulse.com.au  Fact Sheet for Healthcare Providers: IncredibleEmployment.be  This test is not yet approved or cleared by the [place] FDA and has been authorized for detection and/or diagnosis of SARS-CoV-2 by FDA under an Emergency Use Authorization (EUA). This EUA will remain in effect (meaning this test can be used) for the duration of the COVID-19 declaration under Section 564(b)(1) of the Act, 21 U.S.C. section 360bbb-3(b)(1), unless the authorization is terminated or revoked.  Performed at [facility], [place] [address] [place], [place] [address]      Labs: BNP (last 3 results) Recent Labs    09/05/21 1028  BNP 79.0   Basic Metabolic Panel: Recent Labs  Lab 09/05/21 1028 09/06/21 0049 09/06/21 0642  NA 141  --  138  K 4.2  --  4.2  CL 109  --  108  CO2 23  --  25  GLUCOSE 92  --  114*  BUN 20  --  19  CREATININE 0.58 0.81 0.64   CALCIUM 9.7  --  9.2  MG  --   --  2.0  PHOS  --   --  4.2   Liver Function Tests: Recent Labs  Lab 09/05/21 1028  AST 18  ALT 20  ALKPHOS 78  BILITOT 0.8  PROT 7.5  ALBUMIN 4.2   No results for input(s): LIPASE, AMYLASE in the last 168 hours. No results for input(s): AMMONIA in the last 168 hours. CBC: Recent Labs  Lab 09/05/21 1028 09/06/21 0049 09/06/21 0642  WBC 5.9 7.3 4.9  NEUTROABS 3.8  --   --   HGB 14.2 13.9 13.0  HCT 42.5 41.9 38.5  MCV 93.2 93.1 92.5  PLT 271 273 253   Cardiac Enzymes: No results for input(s): CKTOTAL, CKMB, CKMBINDEX, TROPONINI in the last 168 hours. BNP: Invalid input(s): POCBNP CBG: No results for input(s): GLUCAP in the last 168 hours. D-Dimer No results for input(s): DDIMER in the last 72 hours. Hgb A1c No results for input(s): HGBA1C in the last 72 hours. Lipid Profile No results for input(s): CHOL, HDL, LDLCALC, TRIG, CHOLHDL, LDLDIRECT in the last 72 hours. Thyroid function studies No results for input(s): TSH, T4TOTAL, T3FREE, THYROIDAB in the last 72 hours.  Invalid input(s): FREET3 Anemia work up No results for input(s): VITAMINB12, FOLATE, FERRITIN, TIBC, IRON, RETICCTPCT in the last 72 hours. Urinalysis    Component Value Date/Time   COLORURINE YELLOW 10/12/2019 1804   APPEARANCEUR CLEAR 10/12/2019 1804   LABSPEC >1.046 (H) 10/12/2019 1804   PHURINE 5.0 10/12/2019 1804   GLUCOSEU NEGATIVE 10/12/2019 1804   HGBUR NEGATIVE 10/12/2019 1804   BILIRUBINUR NEGATIVE 10/12/2019 1804   KETONESUR 5 (A) 10/12/2019 1804   PROTEINUR NEGATIVE 10/12/2019 1804   UROBILINOGEN 0.2 01/30/2015 2221   NITRITE NEGATIVE 10/12/2019 1804   LEUKOCYTESUR NEGATIVE 10/12/2019 1804   Sepsis Labs Invalid input(s): PROCALCITONIN,  WBC,  LACTICIDVEN Microbiology Recent Results (from the past 240 hour(s))  Resp Panel by RT-PCR (Flu A&B, Covid) Nasopharyngeal Swab     Status: None   Collection Time: 09/05/21 10:32 AM   Specimen:  Nasopharyngeal Swab; Nasopharyngeal(NP) swabs in vial transport medium  Result Value Ref Range Status   SARS Coronavirus 2 by RT PCR NEGATIVE NEGATIVE Final    Comment: (NOTE) SARS-CoV-2 target nucleic acids are NOT DETECTED.  The SARS-CoV-2 RNA is generally detectable in upper respiratory specimens during the acute phase of infection. The lowest concentration of SARS-CoV-2 viral copies this assay can detect is 138 copies/mL. A negative result does not preclude SARS-Cov-2 infection and should not be used as the sole basis for treatment or other patient management decisions. A negative result may occur with  improper specimen collection/handling, submission of specimen other than nasopharyngeal swab, presence of viral mutation(s) within the areas targeted by this assay, and inadequate number of viral copies(<138 copies/mL). A negative result must be combined with clinical observations, patient history, and epidemiological information. The expected result is Negative.  Fact Sheet for Patients:  EntrepreneurPulse.com.au  Fact Sheet for Healthcare Providers:  IncredibleEmployment.be  This test is no t yet approved or cleared by the [place] FDA and  has been authorized for detection and/or diagnosis of SARS-CoV-2 by FDA under an Emergency Use Authorization (EUA). This EUA will remain  in effect (meaning this test can be used) for the duration of the COVID-19 declaration under Section 564(b)(1) of the Act, 21 U.S.C.section 360bbb-3(b)(1), unless the authorization is terminated  or revoked sooner.       Influenza A by PCR NEGATIVE NEGATIVE Final   Influenza B by PCR NEGATIVE NEGATIVE Final    Comment: (NOTE) The Xpert Xpress SARS-CoV-2/FLU/RSV plus assay is intended as an aid in the diagnosis of influenza from Nasopharyngeal swab specimens and  should not be used as a sole basis for treatment. Nasal washings and aspirates are unacceptable for  Xpert Xpress SARS-CoV-2/FLU/RSV testing.  Fact Sheet for Patients: EntrepreneurPulse.com.au  Fact Sheet for Healthcare Providers: IncredibleEmployment.be  This test is not yet approved or cleared by the [place] FDA and has been authorized for detection and/or diagnosis of SARS-CoV-2 by FDA under an Emergency Use Authorization (EUA). This EUA will remain in effect (meaning this test can be used) for the duration of the COVID-19 declaration under Section 564(b)(1) of the Act, 21 U.S.C. section 360bbb-3(b)(1), unless the authorization is terminated or revoked.  Performed at [facility], [place] [address] [place], [place] [address]      Time coordinating discharge in minutes: 65  SIGNED:   Debbe Odea, MD  [facility] 09/08/2021, 3:37 PM

## 2021-09-08 NOTE — TOC Transition Note (Signed)
Transition of Care [facility]) - CM/SW Discharge Note   Patient Details  Name: Lori Owens MRN: [id number] Date of Birth: 05-15-1961  [facility] CM/SW Contact:  Lennart Pall, LCSW Phone Number: 09/08/2021, 9:26 AM   Clinical Narrative:    Met with pt who is aware orders are in for home nebulizer machine. Pt without agency preference. Referral placed with [facility] for delivery to pt's room prior to dc today.  No further TOC needs.   Final next level of care: Home/Self Care Barriers to Discharge: No Barriers Identified   Patient Goals and CMS Choice Patient states their goals for this hospitalization and ongoing recovery are:: return home      Discharge Placement                       Discharge Plan and Services                DME Arranged: Nebulizer machine DME Agency: [employer] Date DME Agency Contacted: 09/08/21 Time DME Agency Contacted: [phone] Representative spoke with at DME Agency: Brenton Grills HH Arranged: NA [place] Agency: NA        Social Determinants of Health ([place]) Interventions     Readmission Risk Interventions No flowsheet data found.

## 2021-09-08 NOTE — Plan of Care (Signed)
Plan of care reviewed and discussed with the patient. 

## 2021-09-08 NOTE — Plan of Care (Signed)
  Problem: Education: Goal: Knowledge of General Education information will improve Description: Including pain rating scale, medication(s)/side effects and non-pharmacologic comfort measures Outcome: Adequate for Discharge   Problem: Health Behavior/Discharge Planning: Goal: Ability to manage health-related needs will improve Outcome: Adequate for Discharge   Problem: Clinical Measurements: Goal: Ability to maintain clinical measurements within normal limits will improve Outcome: Adequate for Discharge Goal: Will remain free from infection Outcome: Adequate for Discharge Goal: Diagnostic test results will improve Outcome: Adequate for Discharge Goal: Respiratory complications will improve Outcome: Adequate for Discharge Goal: Cardiovascular complication will be avoided Outcome: Adequate for Discharge   Problem: Activity: Goal: Risk for activity intolerance will decrease Outcome: Adequate for Discharge   Problem: Coping: Goal: Level of anxiety will decrease Outcome: Adequate for Discharge   Problem: Elimination: Goal: Will not experience complications related to bowel motility Outcome: Adequate for Discharge   Problem: Pain Managment: Goal: General experience of comfort will improve Outcome: Adequate for Discharge   Problem: Safety: Goal: Ability to remain free from injury will improve Outcome: Adequate for Discharge   Problem: Skin Integrity: Goal: Risk for impaired skin integrity will decrease Outcome: Adequate for Discharge

## 2021-09-14 ENCOUNTER — Telehealth: Payer: Self-pay | Admitting: Acute Care

## 2021-09-14 ENCOUNTER — Telehealth: Payer: Medicare HMO | Admitting: Acute Care

## 2021-09-14 NOTE — Telephone Encounter (Signed)
Patient was a no show for her shared decision making visit. CT Scan appointment will be cancelled and patient will be rescheduled.

## 2021-09-15 ENCOUNTER — Ambulatory Visit (HOSPITAL_BASED_OUTPATIENT_CLINIC_OR_DEPARTMENT_OTHER): Payer: Medicare HMO

## 2021-09-28 ENCOUNTER — Telehealth: Payer: Self-pay | Admitting: Pulmonary Disease

## 2021-09-28 ENCOUNTER — Other Ambulatory Visit: Payer: Self-pay

## 2021-09-28 ENCOUNTER — Ambulatory Visit (INDEPENDENT_AMBULATORY_CARE_PROVIDER_SITE_OTHER): Payer: Medicare HMO | Admitting: Pulmonary Disease

## 2021-09-28 DIAGNOSIS — J449 Chronic obstructive pulmonary disease, unspecified: Secondary | ICD-10-CM

## 2021-09-28 LAB — PULMONARY FUNCTION TEST
DL/VA % pred: 100 %
DL/VA: 4.2 ml/min/mmHg/L
DLCO cor % pred: 76 %
DLCO cor: 16.55 ml/min/mmHg
DLCO unc % pred: 75 %
DLCO unc: 16.35 ml/min/mmHg
FEF 25-75 Post: 1.1 L/sec
FEF 25-75 Pre: 0.65 L/sec
FEF2575-%Change-Post: 70 %
FEF2575-%Pred-Post: 43 %
FEF2575-%Pred-Pre: 25 %
FEV1-%Change-Post: 12 %
FEV1-%Pred-Post: 54 %
FEV1-%Pred-Pre: 47 %
FEV1-Post: 1.5 L
FEV1-Pre: 1.33 L
FEV1FVC-%Change-Post: 0 %
FEV1FVC-%Pred-Pre: 79 %
FEV6-%Change-Post: 11 %
FEV6-%Pred-Post: 69 %
FEV6-%Pred-Pre: 62 %
FEV6-Post: 2.4 L
FEV6-Pre: 2.15 L
FEV6FVC-%Pred-Post: 103 %
FEV6FVC-%Pred-Pre: 103 %
FVC-%Change-Post: 11 %
FVC-%Pred-Post: 67 %
FVC-%Pred-Pre: 59 %
FVC-Post: 2.4 L
FVC-Pre: 2.15 L
Post FEV1/FVC ratio: 63 %
Post FEV6/FVC ratio: 100 %
Pre FEV1/FVC ratio: 62 %
Pre FEV6/FVC Ratio: 100 %
RV % pred: 166 %
RV: 3.44 L
TLC % pred: 109 %
TLC: 5.88 L

## 2021-09-28 NOTE — Progress Notes (Signed)
Full PFT performed today. °

## 2021-09-28 NOTE — Patient Instructions (Signed)
Full PFT performed today. °

## 2021-09-28 NOTE — Telephone Encounter (Signed)
I called the patient and she is aware that the PFT results are prelaminarly. Please advise on the results.

## 2021-10-06 ENCOUNTER — Telehealth (INDEPENDENT_AMBULATORY_CARE_PROVIDER_SITE_OTHER): Payer: Medicare HMO | Admitting: Acute Care

## 2021-10-06 ENCOUNTER — Encounter: Payer: Self-pay | Admitting: Acute Care

## 2021-10-06 ENCOUNTER — Other Ambulatory Visit: Payer: Self-pay

## 2021-10-06 DIAGNOSIS — F17210 Nicotine dependence, cigarettes, uncomplicated: Secondary | ICD-10-CM | POA: Diagnosis not present

## 2021-10-06 NOTE — Progress Notes (Addendum)
Virtual Visit via Telephone Note  I connected with Lori Owens on 09/22/21 at  2:00 PM EST by telephone and verified that I am speaking with the correct person using two identifiers.  Location: Patient: Home Provider: Working from home   I discussed the limitations, risks, security and privacy concerns of performing an evaluation and management service by telephone and the availability of in person appointments. I also discussed with the patient that there may be a patient responsible charge related to this service. The patient expressed understanding and agreed to proceed.  Shared Decision Making Visit Lung Cancer Screening Program [id number])   Eligibility: Age 60 y.o. Pack Years Smoking History Calculation 22.5 (# packs/per year x # years smoked) Recent History of coughing up blood  no Unexplained weight loss? no ( >Than 15 pounds within the last 6 months ) Prior History Lung / other cancer no (Diagnosis within the last 5 years already requiring surveillance chest CT Scans). Smoking Status Current Smoker Former Smokers: Years since quit:   Quit Date: NA  Visit Components: Discussion included one or more decision making aids. yes Discussion included risk/benefits of screening. yes Discussion included potential follow up diagnostic testing for abnormal scans. yes Discussion included meaning and risk of over diagnosis. yes Discussion included meaning and risk of False Positives. yes Discussion included meaning of total radiation exposure. yes  Counseling Included: Importance of adherence to annual lung cancer LDCT screening. yes Impact of comorbidities on ability to participate in the program. yes Ability and willingness to under diagnostic treatment. yes  Smoking Cessation Counseling: Current Smokers:  Discussed importance of smoking cessation. yes Information about tobacco cessation classes and interventions provided to patient. yes Patient provided with "ticket" for LDCT  Scan. yes Symptomatic Patient. yes  Counseling(Intermediate counseling: > three minutes) [id number] Diagnosis Code: Tobacco Use Z72.0 Asymptomatic Patient NA  Counseling NA Former Smokers:  Discussed the importance of maintaining cigarette abstinence. yes Diagnosis Code: Personal History of Nicotine Dependence. N27.782 Information about tobacco cessation classes and interventions provided to patient. Yes Patient provided with "ticket" for LDCT Scan. yes Written Order for Lung Cancer Screening with LDCT placed in Epic. Yes (CT Chest Lung Cancer Screening Low Dose W/O CM) [id number] Z12.2-Screening of respiratory organs Z87.891-Personal history of nicotine dependence   I spent 25 minutes of face to face time with her discussing the risks and benefits of lung cancer screening. We viewed a power point together that explained in detail the above noted topics. We took the time to pause the power point at intervals to allow for questions to be asked and answered to ensure understanding. We discussed that she had taken the single most powerful action possible to decrease her risk of developing lung cancer when she quit smoking. I counseled her to remain smoke free, and to contact me if she ever had the desire to smoke again so that I can provide resources and tools to help support the effort to remain smoke free. We discussed the time and location of the scan, and that either  [name] RN or I will call with the results within  24-48 hours of receiving them. She has my card and contact information in the event she needs to speak with me, in addition to a copy of the power point we reviewed as a resource. She verbalized understanding of all of the above and had no further questions upon leaving the office.     I explained to the patient that there has been a high  incidence of coronary artery disease noted on these exams. I explained that this is a non-gated exam therefore degree or severity cannot be  determined. This patient is not on statin therapy. I have asked the patient to follow-up with their PCP regarding any incidental finding of coronary artery disease and management with diet or medication as they feel is clinically indicated. The patient verbalized understanding of the above and had no further questions.   Lori Owens D. Lori Kingfisher, NP-C [facility] Personal contact information can be found on Amion  10/06/2021, 2:36 PM

## 2021-10-07 ENCOUNTER — Ambulatory Visit (HOSPITAL_BASED_OUTPATIENT_CLINIC_OR_DEPARTMENT_OTHER)
Admission: RE | Admit: 2021-10-07 | Discharge: 2021-10-07 | Disposition: A | Discharge status: Home / Self Care | Payer: Medicare HMO | Source: Ambulatory Visit | Attending: Advanced Practice Midwife | Admitting: Advanced Practice Midwife

## 2021-10-07 DIAGNOSIS — Z87891 Personal history of nicotine dependence: Secondary | ICD-10-CM | POA: Insufficient documentation

## 2021-10-07 NOTE — Patient Instructions (Signed)
Thank you for participating in the [facility]. °It was our pleasure to meet you today. °We will call you with the results of your scan within the next few days. °Your scan will be assigned a Lung RADS category score by the physicians reading the scans.  °This Lung RADS score determines follow up scanning.  °See below for description of categories, and follow up screening recommendations. °We will be in touch to schedule your follow up screening annually or based on recommendations of our providers. °We will fax a copy of your scan results to your Primary Care Physician, or the physician who referred you to the program, to ensure they have the results. °Please call the office if you have any questions or concerns regarding your scanning experience or results.  °Our office number is [phone]. °Please speak with Denise Phelps, RN. She is our Lung Cancer Screening RN. °If she is unavailable when you call, please have the office staff send her a message. She will return your call at her earliest convenience. °Remember, if your scan is normal, we will scan you annually as long as you continue to meet the criteria for the program. (Age 55-77, Current smoker or smoker who has quit within the last 15 years). °If you are a smoker, remember, quitting is the single most powerful action that you can take to decrease your risk of lung cancer and other pulmonary, breathing related problems. °We know quitting is hard, and we are here to help.  °Please let us know if there is anything we can do to help you meet your goal of quitting. °If you are a former smoker, congratulations. We are proud of you! Remain smoke free! °Remember you can refer friends or family members through the number above.  °We will screen them to make sure they meet criteria for the program. °Thank you for helping us take better care of you by participating in Lung Screening. ° °You can receive free nicotine replacement therapy  ( patches, gum or mints) by calling 1-800-QUIT NOW. Please call so we can get you on the path to becoming  a non-smoker. I know it is hard, but you can do this! ° °Lung RADS Categories: ° °Lung RADS 1: no nodules or definitely non-concerning nodules.  °Recommendation is for a repeat annual scan in 12 months. ° °Lung RADS 2:  nodules that are non-concerning in appearance and behavior with a very low likelihood of becoming an active cancer. °Recommendation is for a repeat annual scan in 12 months. ° °Lung RADS 3: nodules that are probably non-concerning , includes nodules with a low likelihood of becoming an active cancer.  Recommendation is for a 6-month repeat screening scan. Often noted after an upper respiratory illness. We will be in touch to make sure you have no questions, and to schedule your 6-month scan. ° °Lung RADS 4 A: nodules with concerning findings, recommendation is most often for a follow up scan in 3 months or additional testing based on our provider's assessment of the scan. We will be in touch to make sure you have no questions and to schedule the recommended 3 month follow up scan. ° °Lung RADS 4 B:  indicates findings that are concerning. We will be in touch with you to schedule additional diagnostic testing based on our provider's  assessment of the scan. ° °Hypnosis for smoking cessation  °[employer] °[phone] ° °Acupuncture for smoking cessation  °[facility] °[phone]  °

## 2021-10-08 NOTE — Telephone Encounter (Signed)
Lori Starr, MD  Mabe, Beatris Ship, CMA; [facility] Triage Pool 2 hours ago (7:38 AM)   Her PFTs show signs of moderate COPD with air trapping.    Called the pt and there was no answer- line rings and then turns to busy signal- called x 3 and same thing  Will call back again later

## 2021-10-09 ENCOUNTER — Encounter: Payer: Self-pay | Admitting: *Deleted

## 2021-10-09 NOTE — Telephone Encounter (Signed)
Tried calling her again and line still busy  Letter mailed

## 2021-10-12 ENCOUNTER — Telehealth: Payer: Self-pay | Admitting: Pulmonary Disease

## 2021-10-12 DIAGNOSIS — F17210 Nicotine dependence, cigarettes, uncomplicated: Secondary | ICD-10-CM

## 2021-10-13 NOTE — Telephone Encounter (Signed)
Pt had PFT performed 09/28/21. Pt also had lung cancer screening visit and CT performed 11/30.  Routing to Dr. Erin Fulling to advise of the PFT results and routing to Judson Roch to advise of the CT results. Please advise.

## 2021-10-13 NOTE — Telephone Encounter (Signed)
Lori Starr, MD  Her PFTs show signs of moderate COPD with air trapping.     Called and spoke with pt and made her aware that we had been trying to reach her to go over the results of the PFT and pt verbalized understanding. Stated to pt the results of the PFT and let her know that either SG or one of the Denise's will contact her about the results of the CT and she verbalized understanding.

## 2021-10-14 NOTE — Telephone Encounter (Signed)
Left voicemail for patient to call back regarding CT results.

## 2021-10-14 NOTE — Telephone Encounter (Signed)
Pt informed of CT results per Sarah Groce, NP.  PT verbalized understanding.  Copy of CT sent to PCP.  Order placed for 1 yr f/u CT. ° °

## 2021-10-14 NOTE — Telephone Encounter (Signed)
Spoke with pt but as Therapist, sports is unfamiliar with terminology used in results note RN is routing message to [name] and Joella Prince who work directly with LDCT results for clarification at pt's request.

## 2021-10-14 NOTE — Addendum Note (Signed)
Addended by: [name] D on: 10/14/2021 10:54 AM   Modules accepted: Orders

## 2021-10-15 ENCOUNTER — Other Ambulatory Visit: Payer: Self-pay | Admitting: Family Medicine

## 2021-10-15 DIAGNOSIS — J4530 Mild persistent asthma, uncomplicated: Secondary | ICD-10-CM

## 2021-11-09 ENCOUNTER — Ambulatory Visit
Admission: EM | Admit: 2021-11-09 | Discharge: 2021-11-09 | Disposition: A | Discharge status: Home / Self Care | Payer: Medicare HMO | Attending: Physician Assistant | Admitting: Physician Assistant

## 2021-11-09 ENCOUNTER — Encounter: Payer: Self-pay | Admitting: Emergency Medicine

## 2021-11-09 ENCOUNTER — Other Ambulatory Visit: Payer: Self-pay

## 2021-11-09 DIAGNOSIS — J0100 Acute maxillary sinusitis, unspecified: Secondary | ICD-10-CM

## 2021-11-09 MED ORDER — AMOXICILLIN-POT CLAVULANATE 875-125 MG PO TABS: 1.0000 | ORAL_TABLET | Freq: Two times a day (BID) | ORAL | 0 refills | Status: DC

## 2021-11-09 NOTE — ED Provider Notes (Signed)
[facility]    CSN: [id number] Arrival date & time: 11/09/21  1529      History   Chief Complaint Chief Complaint  Patient presents with   Facial Pain    HPI Lori Owens is a 62 y.o. female.   Patient here today for evaluation of right sided facial pain and swelling that started after having nasal congestion for the last week. She does have history of nasal polyp and states that symptoms are somewhat similar. She has not had any fever until today- felt low grade fever. She denies any cough. She has not had sore throat. She has tried OTC meds without significant relief.   The history is provided by the patient.   Past Medical History:  Diagnosis Date   Asthma    COPD (chronic obstructive pulmonary disease) ([place])    Depression 01/18/2013   Headache(784.0)    Heart murmur    Melanoma ([place])    Seasonal allergies    Tobacco abuse 01/18/2013    Patient Active Problem List   Diagnosis Date Noted   Anxiety and depression 09/06/2021   COPD (chronic obstructive pulmonary disease) with acute bronchitis ([place]) 09/06/2021   Circulation problem 04/09/2020   Breast cancer screening by mammogram 01/24/2020   Difficulty sleeping 01/24/2020   Other abnormal glucose 01/24/2020   Health maintenance examination 01/24/2020   Anxiety 04/24/2016   Exertional chest pain    MDD (major depressive disorder), recurrent severe, 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   Tobacco use disorder 05/07/2015   Obesity 05/03/2015   Excessive daytime sleepiness 03/03/2015   Heart palpitations 12/24/2014   Snoring 12/23/2014   Witnessed apneic spells 12/23/2014   Cardiomyopathy ([place]) 09/19/2014   Mild persistent asthma 09/03/2014   Asthma exacerbation 09/02/2014   Suspicious mole 04/19/2013   SOB (shortness of breath) 04/19/2013   Hypokalemia 01/18/2013   Dehydration 01/18/2013   Tobacco abuse 01/18/2013   Tachycardia 01/18/2013    Past Surgical History:  Procedure Laterality Date    ABDOMINAL HYSTERECTOMY     NOSE SURGERY     polyp removal from nasal cavity    THROAT SURGERY     TONSILLECTOMY      OB History   No obstetric history on file.      Home Medications    Prior to Admission medications   Medication Sig Start Date End Date Taking? Authorizing Provider  amoxicillin-clavulanate (AUGMENTIN) 875-125 MG tablet Take 1 tablet by mouth every 12 (twelve) hours. 11/09/21  Yes Francene Finders, PA-C  albuterol (PROVENTIL) (2.5 MG/3ML) 0.083% nebulizer solution TAKE 3 MLS BY NEBULIZATION EVERY 6 HOURS AS NEEDED FOR WHEEZING OR SHORTNESS OF BREATH. Patient taking differently: Take 2.5 mg by nebulization every 6 (six) hours as needed for wheezing or shortness of breath. 06/08/21   Gifford Shave, MD  albuterol (VENTOLIN HFA) 108 (90 Base) MCG/ACT inhaler TAKE 2 PUFFS BY MOUTH EVERY 6 HOURS AS NEEDED FOR WHEEZE OR SHORTNESS OF BREATH 10/15/21   Gifford Shave, MD  azithromycin (ZITHROMAX) 250 MG tablet Take 1 tablet (250 mg total) by mouth daily. 09/08/21   Debbe Odea, MD  Cholecalciferol 5000 units capsule Take 5,000 Units by mouth every morning. 05/30/15   [provider]  fluticasone (FLONASE) 50 MCG/ACT nasal spray Place 2 sprays into both nostrils daily as needed for allergies or rhinitis. 01/24/20   Gifford Shave, MD  Fluticasone-Umeclidin-Vilant (TRELEGY ELLIPTA) 100-62.5-25 MCG/INH AEPB Inhale 1 puff into the lungs daily. 08/04/21   Freddi Starr, MD  guaiFENesin (  MUCINEX) 600 MG 12 hr tablet Take 2 tablets (1,200 mg total) by mouth 2 (two) times daily. 09/08/21   Debbe Odea, MD  ipratropium-albuterol (DUONEB) 0.5-2.5 (3) MG/3ML SOLN Take 3 mLs by nebulization every 4 (four) hours as needed. 09/08/21   Debbe Odea, MD  montelukast (SINGULAIR) 10 MG tablet TAKE 1 TABLET BY MOUTH EVERY DAY Patient taking differently: Take 10 mg by mouth at bedtime as needed (congestion). 06/08/21   Gifford Shave, MD  PARoxetine (PAXIL) 40 MG tablet TAKE 1 TABLET BY  MOUTH EVERY DAY IN THE MORNING Patient taking differently: Take 40 mg by mouth daily after breakfast. 08/07/21   Gifford Shave, MD  predniSONE (DELTASONE) 10 MG tablet 60 mg tomorrow, taper by 10 mg daily until complete 09/08/21   Debbe Odea, MD  sodium chloride HYPERTONIC 3 % nebulizer solution Take 4 mLs by nebulization 2 (two) times daily. 09/08/21   Debbe Odea, MD  SUPER B COMPLEX/C PO Take 1 tablet by mouth every morning.    [provider]  traZODone (DESYREL) 50 MG tablet TAKE 2 TABLETS (100 MG TOTAL) BY MOUTH AT BEDTIME AS NEEDED FOR SLEEP. Patient taking differently: Take 150 mg by mouth at bedtime. 07/31/21   Gifford Shave, MD    Family History Family History  Problem Relation Age of Onset   Diabetes Maternal Grandfather    Heart disease Maternal Grandfather    Cancer Maternal Grandfather    Hypertension Maternal Grandfather     Social History Social History   Tobacco Use   Smoking status: Every Day    Packs/day: 0.75    Years: 30.00    Pack years: 22.50    Types: Cigarettes   Smokeless tobacco: Never  Vaping Use   Vaping Use: Never used  Substance Use Topics   Alcohol use: Yes    Alcohol/week: 0.0 standard drinks    Comment: Occasional   Drug use: Yes    Types: Marijuana    Comment: Occasional     Allergies   Dust mite extract, Cymbalta [duloxetine hcl], Escitalopram oxalate, and Other   Review of Systems Review of Systems  Constitutional:  Positive for fever. Negative for chills.  HENT:  Positive for congestion, facial swelling and sinus pressure.   Eyes:  Negative for discharge and redness.  Respiratory:  Negative for cough and shortness of breath.   Gastrointestinal:  Negative for abdominal pain, nausea and vomiting.  Genitourinary:  Positive for vaginal bleeding and vaginal discharge.    Physical Exam Triage Vital Signs ED Triage Vitals [11/09/21 1753]  Enc Vitals Group     BP (!) 145/90     Pulse Rate 72     Resp 16      Temp 98.1 F (36.7 C)     Temp Source Oral     SpO2 97 %     Weight      Height      Head Circumference      Peak Flow      Pain Score 7     Pain Loc      Pain Edu?      Excl. in [place]?    No data found.  Updated Vital Signs BP (!) 145/90 (BP Location: Left Arm)    Pulse 72    Temp 98.1 F (36.7 C) (Oral)    Resp 16    SpO2 97%   \ Physical Exam Vitals and nursing note reviewed.  Constitutional:      General: She is  not in acute distress.    Appearance: Normal appearance. She is not ill-appearing.  HENT:     Head: Normocephalic and atraumatic.     Comments: Mild swelling noted to right maxillary area    Nose: Congestion present.  Eyes:     Conjunctiva/sclera: Conjunctivae normal.  Cardiovascular:     Rate and Rhythm: Normal rate.  Pulmonary:     Effort: Pulmonary effort is normal.  Neurological:     Mental Status: She is alert.  Psychiatric:        Mood and Affect: Mood normal.        Behavior: Behavior normal.        Thought Content: Thought content normal.     UC Treatments / Results  Labs (all labs ordered are listed, but only abnormal results are displayed) Labs Reviewed - No data to display  EKG   Radiology No results found.  Procedures Procedures (including critical care time)  Medications Ordered in UC Medications - No data to display  Initial Impression / Assessment and Plan / UC Course  I have reviewed the triage vital signs and the nursing notes.  Pertinent labs & imaging results that were available during my care of the patient were reviewed by me and considered in my medical decision making (see chart for details).   Will treat to cover acute sinusitis. Recommended follow up with PCP as she may need referral to ENT if symptoms do not improve.   Final Clinical Impressions(s) / UC Diagnoses   Final diagnoses:  Acute maxillary sinusitis, recurrence not specified   Discharge Instructions   None    ED Prescriptions     Medication Sig  Dispense Auth. Provider   amoxicillin-clavulanate (AUGMENTIN) 875-125 MG tablet Take 1 tablet by mouth every 12 (twelve) hours. 14 tablet Francene Finders, PA-C      PDMP not reviewed this encounter.   Francene Finders, PA-C 11/09/21 1823

## 2021-11-09 NOTE — ED Triage Notes (Signed)
Facial pain, nasal congestion x 1 week

## 2021-11-20 ENCOUNTER — Ambulatory Visit (INDEPENDENT_AMBULATORY_CARE_PROVIDER_SITE_OTHER): Payer: Medicare HMO

## 2021-11-20 ENCOUNTER — Ambulatory Visit (INDEPENDENT_AMBULATORY_CARE_PROVIDER_SITE_OTHER): Payer: Medicare HMO | Admitting: Family Medicine

## 2021-11-20 ENCOUNTER — Other Ambulatory Visit: Payer: Self-pay

## 2021-11-20 ENCOUNTER — Encounter: Payer: Self-pay | Admitting: Family Medicine

## 2021-11-20 VITALS — BP 124/89 | HR 84 | Ht 65.0 in | Wt 171.2 lb

## 2021-11-20 DIAGNOSIS — Z23 Encounter for immunization: Secondary | ICD-10-CM | POA: Diagnosis not present

## 2021-11-20 DIAGNOSIS — L659 Nonscarring hair loss, unspecified: Secondary | ICD-10-CM | POA: Diagnosis not present

## 2021-11-20 DIAGNOSIS — J339 Nasal polyp, unspecified: Secondary | ICD-10-CM

## 2021-11-20 DIAGNOSIS — Z8709 Personal history of other diseases of the respiratory system: Secondary | ICD-10-CM

## 2021-11-20 MED ORDER — XHANCE 93 MCG/ACT NA EXHU: 1.0000 | INHALANT_SUSPENSION | Freq: Two times a day (BID) | NASAL | 3 refills | Status: DC

## 2021-11-20 NOTE — Patient Instructions (Signed)
It was so great seeing you today! Today we discussed the following:  -For your concern for nasal polyp, I have sent in a medication called Truett Perna that she can use to help treated polyp.  I have also placed a referral to ENT in case you are needing a removal of it.  -For your thinning hair, I am not sure how to percent what is causing it but we will get a thyroid check as well as checking your ferritin level (helps [place] to check your iron) to see if that may be contributing.  If it is then we will address it as able, though I cannot guarantee that this will fix your symptoms    Please make sure to bring any medications you take to your appointments. If you have any questions or concerns please call the office at [phone].   If any tests were collected today, I will notify you of results via MyChart, phone call, or letter. Please contact the office if you have not heard back about results within 2 weeks.

## 2021-11-20 NOTE — Progress Notes (Signed)
° ° °  SUBJECTIVE:   CHIEF COMPLAINT / HPI:   Patient was recently treated for acute maxillary sinusitis on 1/2/223 with Augmentin.  She notes that she has had some improvement in her symptoms but that she is still having pain on palpation a little bit of swelling of the right maxilla.  She is not having any fevers anymore and does not have any rash, chills, difficulty swallowing.  She notes that she has had a polyp previously that required surgical removal and would like referral to ENT.  Patient also notes that she has been having thinning hair for about the last 4 months.  She is at increased stress since December with when her mother had a stroke, but notes that the thinning hair was occurring before this.  She does not note any clumps but feels like it is more volume whenever she is taking a shower.  She notes no specific areas that are thinning in particular, just diffusely noted.  Does not know of any change in hygiene products or medications in the last several months.  PERTINENT  PMH / PSH: Reviewed  OBJECTIVE:   Ht 5\' 5"  (1.651 m)    Wt 171 lb 3.2 oz (77.7 kg)    BMI 28.49 kg/m   General: NAD, well-appearing, well-nourished HEENT: mild irritation of the bilateral nasal cavities, no obvious polyp on examination; throat clear without erythema, fair dentition without acute abscess present. Frontal sinuses non-tender. Jaw movement without claudication or pain. Respiratory: No respiratory distress, breathing comfortably, able to speak in full sentences Skin: warm and dry, no rashes noted on exposed skin Psych: Appropriate affect and mood  ASSESSMENT/PLAN:   Recent acute maxillary sinusitis   history of polyp requiring removal Patient reports that she is currently on Augmentin and symptoms are improving, but she still notes that there is swelling and significant pain in the area.  She has had to have a polyp removed before and presented similarly.  Physical exam significant for tenderness  palpation of the right maxillary sinus and some mild swelling. - Prescribed Xhance for nasal polyp treatment - Referral to ENT per patient request  Thinning hair No obvious specific areas of thinning or alopecia noted on scalp.  No erythematous areas or rashes.  Patient does have increased stress recently, but notes that thinning was happening prior to this as well, no medication changes, no hygiene product changes either. - TSH and ferritin to evaluate for reversible causes - Discussed using OTC Rogaine if patient would like to encourage hair growth   Rise Patience, [place]

## 2021-11-21 LAB — TSH: TSH: 2.05 u[IU]/mL (ref 0.450–4.500)

## 2021-11-21 LAB — FERRITIN: Ferritin: 91 ng/mL (ref 15–150)

## 2021-11-23 ENCOUNTER — Telehealth: Payer: Self-pay

## 2021-11-23 NOTE — Telephone Encounter (Signed)
Received fax from pharmacy, PA needed on [place].  Clinical questions submitted via Cover My Meds.  Waiting on response, could take up to 72 hours.  Cover My Meds info: Key: [id number]  Talbot Grumbling, RN

## 2021-11-23 NOTE — Telephone Encounter (Signed)
Received approval on medication through 11/07/2022.   Called pharmacy with approval.   Patient updated.   Talbot Grumbling, RN

## 2021-11-23 NOTE — Telephone Encounter (Signed)
Called patient and informed of below. Patient appreciative.   Talbot Grumbling, RN

## 2021-11-23 NOTE — Telephone Encounter (Signed)
Patient calls nurse line regarding lab results. Please advise. I am happy to call patient with detailed message.   Thanks.   Talbot Grumbling, RN

## 2021-11-25 ENCOUNTER — Encounter: Payer: Self-pay | Admitting: Family Medicine

## 2021-12-21 ENCOUNTER — Telehealth: Payer: Self-pay

## 2021-12-21 NOTE — Progress Notes (Signed)
SUBJECTIVE:   CHIEF COMPLAINT / HPI:   COPD Exacerbation Lori Owens is a 61 y.o. female who presents to the [facility] clinic today to discuss concerns regarding COPD exacerbation. She states that she got a puppy for Christmas and has another dog at home.  Unfortunately, she does have allergies to pets.  She states that lately she has been potty training her puppy and has had to use bleach.  She is also sensitive to this.  Also typically gets exacerbations with weather change.  States that her COPD is aggravated about twice a year.  For the last couple days she has been using her nebulizer about once a day.  She uses her Trelegy daily.  She does feel increased shortness of breath, chest tightness, increased cough.  She has little sputum production and has had no fevers.  She also took Alka-Seltzer over-the-counter max strength chest congestion which helped a little bit.  She is smoking again, estimates 16 cigarettes a day.  She has been smoking since the age of 61 years old never more than a pack a day.  Per chart review, she is followed by Dr. Erin Fulling with [facility]. She was last seen in September and switched from Symbicort to [place]. She had PFTs performed 09/28/21. She is overdue for follow up with Pulmonology. She had a low-dose CT scan for lung cancer screening on 12/1 which showed benign appearance but did incidentally show LAD and circumflex artery calcifications, aortic atherosclerosis and emphysema. She is currently not on a statin- last lipid panel 01/2020 (total cholesterol 207, LDL 121).   Health Maintenance Overdue for Tdap, shingles vaccine. Overdue for mammogram. Will offer Hep C screening. States she had colon cancer screening in [place] last year- was told to return in 3 years (2025). States that she has had a total hysterectomy thus does not need Pap smear screening.  PERTINENT  PMH / PSH: Former tobacco use  OBJECTIVE:   BP 128/90    Pulse 83    Ht 5\' 5"   (1.651 m)    Wt 171 lb 9.6 oz (77.8 kg)    SpO2 92%    BMI 28.56 kg/m    General: NAD, pleasant, able to participate in exam HEENT: Normocephalic, TM clear b/l Cardiac: RRR, no murmurs. Respiratory: Increased expiratory phase, decreased air movement in all fields, diffuse rhonchi and expiratory wheezing Extremities: no edema or cyanosis. Skin: warm and dry, no rashes noted Psych: Normal affect and mood  ASSESSMENT/PLAN:   COPD exacerbation (HCC) Increased shortness of breath and cough without decrease sputum production.  On examination today pulse ox 92%, breathing comfortably, lung with diffuse rhonchi, expiratory wheezing as well as decreased air movement in all fields.  Discussed using nebulizer more often to help open up lungs.  We will give prednisone burst.  She should continue using her Trelegy daily.  Uncertain how to decrease further exacerbations given her environmental exposures at home that are certainly not helping-her pets. -Prednisone 40 mg x5 days -Increase use of Nebulizer, duonebs -F/u with Pulmonology, contact information given -Continue Trelegy daily  -Albuterol inhaler PRN SOB  Aortic atherosclerosis (HCC) Seen on her low-dose CT, also with calcifications to LAD and circumflex artery.  Starting on rosuvastatin 10 mg.  Will obtain lipid panel today.  Also briefly discussed diet recommendations to help lower cholesterol.  Encouraged smoking cessation.  Tobacco abuse Unfortunately, she has started smoking again.  She has previously used Chantix and had success with it.  She is  amenable to restarting Chantix today. -Prescription sent for Chantix  Healthcare maintenance Sent prescription for shingles vaccination.  Patient reports history of total hysterectomy, thus does not need a Pap smear.  Placed order for mammogram, patient to call and schedule at her own convenience.  Hepatitis C screening today.  Patient is up-to-date on colonoscopy, next due 2025.     Sharion Settler, [place]

## 2021-12-21 NOTE — Patient Instructions (Signed)
It was wonderful to see you today.  Please bring ALL of your medications with you to every visit.   Today we talked about:  -I'm sorry you are not feeling well! We will start you on Prednisone. Take 40 mg for 5 days. Use your nebulizer as needed for shortness of breath. -You are overdue for a follow up with Pulmonology. Please call to schedule an appointment. [address], [place], [place] [address], [phone]  -You are overdue for breast cancer screening, I have ordered a mammogram.  You will need to call to schedule appointment at your earliest convenience. Their number is [phone].  -I am sending a referral to GI for colon cancer screening, they should call you for an appointment.  -We are checking your cholesterol today and starting you on a medication for your cholesterol, please come back in 6 weeks for a recheck. [facility]:  Address: [address], [place], [place] [address] Phone: [phone]  Thank you for choosing [facility].   Please call [phone] with any questions about today's appointment.  Please be sure to schedule follow up at the front  desk before you leave today.   Sharion Settler, DO PGY-2 Family Medicine

## 2021-12-21 NOTE — Telephone Encounter (Signed)
Patient calls nurse line requesting refill on prednisone for COPD exacerbation. Patient reports that she has been having issues with cough and intermittent wheezing for the past week. Patient is currently able to speak in complete sentences and is not experiencing shortness of breath.   Advised patient that she should be evaluated for concern.   Scheduled patient for tomorrow afternoon at 2:10.  Strict ED precautions given.   Talbot Grumbling, RN

## 2021-12-22 ENCOUNTER — Other Ambulatory Visit: Payer: Self-pay

## 2021-12-22 ENCOUNTER — Ambulatory Visit (INDEPENDENT_AMBULATORY_CARE_PROVIDER_SITE_OTHER): Payer: Medicare HMO | Admitting: Family Medicine

## 2021-12-22 VITALS — BP 128/90 | HR 83 | Ht 65.0 in | Wt 171.6 lb

## 2021-12-22 DIAGNOSIS — Z720 Tobacco use: Secondary | ICD-10-CM

## 2021-12-22 DIAGNOSIS — Z0000 Encounter for general adult medical examination without abnormal findings: Secondary | ICD-10-CM | POA: Diagnosis not present

## 2021-12-22 DIAGNOSIS — Z1159 Encounter for screening for other viral diseases: Secondary | ICD-10-CM | POA: Diagnosis not present

## 2021-12-22 DIAGNOSIS — Z1231 Encounter for screening mammogram for malignant neoplasm of breast: Secondary | ICD-10-CM

## 2021-12-22 DIAGNOSIS — I700 Atherosclerosis of aorta: Secondary | ICD-10-CM | POA: Diagnosis not present

## 2021-12-22 DIAGNOSIS — J441 Chronic obstructive pulmonary disease with (acute) exacerbation: Secondary | ICD-10-CM

## 2021-12-22 MED ORDER — VARENICLINE TARTRATE 0.5 MG PO TABS: ORAL_TABLET | ORAL | 0 refills | Status: DC

## 2021-12-22 MED ORDER — ROSUVASTATIN CALCIUM 10 MG PO TABS: 10.0000 mg | ORAL_TABLET | Freq: Every day | ORAL | 3 refills | Status: AC

## 2021-12-22 MED ORDER — PREDNISONE 20 MG PO TABS: 40.0000 mg | ORAL_TABLET | Freq: Every day | ORAL | 0 refills | Status: AC

## 2021-12-22 MED ORDER — ZOSTER VAC RECOMB ADJUVANTED 50 MCG/0.5ML IM SUSR: 0.5000 mL | Freq: Once | INTRAMUSCULAR | 0 refills | Status: AC

## 2021-12-22 NOTE — Assessment & Plan Note (Signed)
Unfortunately, she has started smoking again.  She has previously used Chantix and had success with it.  She is amenable to restarting Chantix today. -Prescription sent for Chantix

## 2021-12-22 NOTE — Assessment & Plan Note (Signed)
Increased shortness of breath and cough without decrease sputum production.  On examination today pulse ox 92%, breathing comfortably, lung with diffuse rhonchi, expiratory wheezing as well as decreased air movement in all fields.  Discussed using nebulizer more often to help open up lungs.  We will give prednisone burst.  She should continue using her Trelegy daily.  Uncertain how to decrease further exacerbations given her environmental exposures at home that are certainly not helping-her pets. -Prednisone 40 mg x5 days -Increase use of Nebulizer, duonebs -F/u with Pulmonology, contact information given -Continue Trelegy daily  -Albuterol inhaler PRN SOB

## 2021-12-22 NOTE — Assessment & Plan Note (Signed)
Sent prescription for shingles vaccination.  Patient reports history of total hysterectomy, thus does not need a Pap smear.  Placed order for mammogram, patient to call and schedule at her own convenience.  Hepatitis C screening today.  Patient is up-to-date on colonoscopy, next due 2025.

## 2021-12-22 NOTE — Assessment & Plan Note (Signed)
Seen on her low-dose CT, also with calcifications to LAD and circumflex artery.  Starting on rosuvastatin 10 mg.  Will obtain lipid panel today.  Also briefly discussed diet recommendations to help lower cholesterol.  Encouraged smoking cessation.

## 2021-12-23 LAB — LIPID PANEL
Chol/HDL Ratio: 3.1 ratio (ref 0.0–4.4)
Cholesterol, Total: 184 mg/dL (ref 100–199)
HDL: 60 mg/dL (ref >39)
LDL Chol Calc (NIH): 110 mg/dL — ABNORMAL HIGH (ref 0–99)
Triglycerides: 78 mg/dL (ref 0–149)
VLDL Cholesterol Cal: 14 mg/dL (ref 5–40)

## 2021-12-23 LAB — HCV AB W REFLEX TO QUANT PCR: HCV Ab: NONREACTIVE

## 2021-12-23 LAB — HCV INTERPRETATION

## 2022-01-03 ENCOUNTER — Other Ambulatory Visit: Payer: Self-pay | Admitting: Family Medicine

## 2022-01-03 DIAGNOSIS — J4530 Mild persistent asthma, uncomplicated: Secondary | ICD-10-CM

## 2022-01-08 ENCOUNTER — Other Ambulatory Visit: Payer: Self-pay

## 2022-01-08 ENCOUNTER — Ambulatory Visit (INDEPENDENT_AMBULATORY_CARE_PROVIDER_SITE_OTHER): Payer: Medicare HMO | Admitting: Family Medicine

## 2022-01-08 DIAGNOSIS — J449 Chronic obstructive pulmonary disease, unspecified: Secondary | ICD-10-CM | POA: Insufficient documentation

## 2022-01-08 DIAGNOSIS — Z720 Tobacco use: Secondary | ICD-10-CM | POA: Diagnosis not present

## 2022-01-08 MED ORDER — PREDNISONE 20 MG PO TABS: 40.0000 mg | ORAL_TABLET | Freq: Every day | ORAL | 0 refills | Status: AC

## 2022-01-08 NOTE — Patient Instructions (Addendum)
It was wonderful to see you today. ? ?Please bring ALL of your medications with you to every visit.  ? ?Today we talked about: ? ?-Please call the Pulmonologist to schedule an appointment.  ?-Increase your Trelegy over the weekend. ?-If you not get better over the weekend, you can fill the prednisone ?-Your smoking is not helping the situation, I would encourage you to start the Chantix as this will help you to stop. ?-If your breathing worsens over the weekend, please go to the Emergency Department ? ? ?Thank you for choosing [facility].  ? ?Please call [phone] with any questions about today's appointment. ? ?Please be sure to schedule follow up at the front  desk before you leave today.  ? ?Sharion Settler, DO ?PGY-2 Family Medicine   ?

## 2022-01-08 NOTE — Assessment & Plan Note (Signed)
Overdue for follow-up with pulmonology.  On examination today she had both inspiratory and expiratory wheezing and intermittent cough sounded wet.  Vitals were notable for a 97% SPO2, normal pulse of 85. ?-Encouraged tobacco cessation ?-Can increase Trelegy use over the weekend ?-Call to schedule f/u with Pulmonology ?-Provided with printed prescription of Prednisone burst to fill if not improved over the weekend ?-Continue other breathing treatments ?-Strict return precautions given for ED ?-Defer abx at this time, doubt overlying pneumonia or infectious cause ? ?

## 2022-01-08 NOTE — Progress Notes (Signed)
? ? ?  SUBJECTIVE:  ? ?CHIEF COMPLAINT / HPI:  ? ?COPD  ?Lori Owens is a 61 y.o. female who presents to the [facility] clinic today to discuss issues with her COPD.  She was last seen on 2/14 for COPD exacerbation.  At that time she was feeling increased shortness of breath, chest tightness, increased cough with little sputum production.  She was advised to follow-up with pulmonology as she was overdue for follow-up.  She was provided with a prednisone burst and advised to continue using her Trelegy daily.  She also had several environmental exposures at home, such as her pets, that were not helping. ? ?Patient felt that her symptoms improved mildly after steroid burst but never completely resolved.  She denies any fever.  No significant sputum production.  He just feels some chest congestion and feels that nothing is allowing her to get rid of the mucus.  She has tried Mucinex in the past which has not helped much.  She is still using her breathing treatments and reports daily adherence to her Trelegy.  She is also tried using Alka-Seltzer with only mild relief.  She has not yet called to schedule an appointment with her pulmonologist. ? ?Tobacco Use Disorder ?At last visit on 2/14 patient reported that she had started smoking again.  She was interested in cessation and was provided with a prescription for Chantix.  Unfortunately, patient states that she has not started the prescription.  States that she is smoking about 15 cigarettes daily.  She was wanting to have her breathing under better control before she attempted to stop or cut back on smoking. ? ?PERTINENT  PMH / PSH: Tobacco use disorder, CAD, anxiety and depression ? ?OBJECTIVE:  ? ?BP (!) 139/93   Pulse 85   Wt 175 lb (79.4 kg)   SpO2 97%   BMI 29.12 kg/m?   ? ?General: NAD, pleasant, able to participate in exam ?Cardiac: RRR, no murmurs. ?Respiratory: Inspiratory and expiratory wheezing, increased expiratory phase, appears comfortable with normal  work of breathing, intermittent cough ?Psych: Normal affect and mood ? ?ASSESSMENT/PLAN:  ? ?COPD (chronic obstructive pulmonary disease) ([place]) ?Overdue for follow-up with pulmonology.  On examination today she had both inspiratory and expiratory wheezing and intermittent cough sounded wet.  Vitals were notable for a 97% SPO2, normal pulse of 85. ?-Encouraged tobacco cessation ?-Can increase Trelegy use over the weekend ?-Call to schedule f/u with Pulmonology ?-Provided with printed prescription of Prednisone burst to fill if not improved over the weekend ?-Continue other breathing treatments ?-Strict return precautions given for ED ?-Defer abx at this time, doubt overlying pneumonia or infectious cause ? ? ?Tobacco abuse ?Unfortunately has not yet started Chantix.  Encouraged her to start and that her tobacco use is not helping with her COPD.  ?  ? ? ?Sharion Settler, DO ?[place]  ? ?

## 2022-01-08 NOTE — Assessment & Plan Note (Signed)
Unfortunately has not yet started Chantix.  Encouraged her to start and that her tobacco use is not helping with her COPD.  ?

## 2022-01-19 ENCOUNTER — Encounter: Payer: Self-pay | Admitting: Pulmonary Disease

## 2022-01-19 ENCOUNTER — Other Ambulatory Visit: Payer: Self-pay

## 2022-01-19 ENCOUNTER — Ambulatory Visit (INDEPENDENT_AMBULATORY_CARE_PROVIDER_SITE_OTHER): Payer: Medicare HMO | Admitting: Pulmonary Disease

## 2022-01-19 VITALS — BP 124/80 | HR 82 | Ht 65.0 in | Wt 177.4 lb

## 2022-01-19 DIAGNOSIS — J449 Chronic obstructive pulmonary disease, unspecified: Secondary | ICD-10-CM | POA: Diagnosis not present

## 2022-01-19 DIAGNOSIS — F17210 Nicotine dependence, cigarettes, uncomplicated: Secondary | ICD-10-CM | POA: Diagnosis not present

## 2022-01-19 DIAGNOSIS — J4530 Mild persistent asthma, uncomplicated: Secondary | ICD-10-CM | POA: Diagnosis not present

## 2022-01-19 MED ORDER — MONTELUKAST SODIUM 10 MG PO TABS: 10.0000 mg | ORAL_TABLET | Freq: Every day | ORAL | 3 refills | Status: DC

## 2022-01-19 MED ORDER — SODIUM CHLORIDE 3 % IN NEBU: 4.0000 mL | INHALATION_SOLUTION | Freq: Two times a day (BID) | RESPIRATORY_TRACT | 5 refills | Status: DC

## 2022-01-19 MED ORDER — ALBUTEROL SULFATE HFA 108 (90 BASE) MCG/ACT IN AERS: INHALATION_SPRAY | RESPIRATORY_TRACT | 3 refills | Status: DC

## 2022-01-19 NOTE — Patient Instructions (Addendum)
Continue trelegy ellipta 1 puff daily ?- rinse mouth out after each use ? ?Continue albuterol nebulizer/inhaler as needed ? ?Continue montelukast daily ? ?Continue allegra daily for alergies ? ?Come back in 3 weeks to have blood work drawn ? ?Start chantix for smoking cessation  ?

## 2022-01-19 NOTE — Progress Notes (Signed)
? ?Synopsis: Referred in September 2022 for COPD by Madison Hickman, MD ? ?Subjective:  ? ?PATIENT ID: Lori Owens. Lori Owens: female DOB: 01/08/1961, MRN: [id number] ? ?HPI ? ?Chief Complaint  ?Patient presents with  ? Follow-up  ?  4 mo f/u for COPD. Had a COPD flare up about 3 weeks ago and was on prednisone.   ? ?Lori Owens is a 61 year old woman, former smoker with history asthma who returns to pulmonary clinic for COPD.  ? ?She reports feeling somewhat better since last visit.  She has received 2 rounds of prednisone for COPD exacerbation.  Her inhaler therapy was increased to Trelegy 1 puff daily.  She is using albuterol inhaler/nebulizer treatments as needed.  She complains of increased chest congestion and sinus congestion at this time.  She is taking Mucinex with some relief.  She is waking up around 3 AM with shortness of breath and wheezing that is relieved by nebulizer treatment.  She has these awakenings despite using a nebulizer treatment prior to bed. ? ?Continues to smoke 12 to 15 cigarettes daily.  She has a prescription for Chantix but was confused on how to take it as she reports not having detailed instructions for the titration. ? ?She is being followed up by ENT with a CT of the sinuses in the near future. ? ?OV 08/04/21 ?She reports being diagnosed with COPD about 10 years ago. She is currently usign symbicort 160-45mg 2 puffs twice daily and as needed albuterol. [place] she has had increased dyspnea, cough, wheezing and night time awakenings with cough/dyspnea/wheezing that responds to albuterol nebulizer treatments. She has sputum production in the mornings. ? ?She denies issues with seasonal allergies or hot or cold temperatures bothering her breathing. She does have sinus congestion and post nasal drainage. She uses flonase which helps. She is suing singulair daily.  ? ?She is  also allergic to cats and dogs which both are in her mothers house. There is no carpeting. She quit smoking about 10 years ago and has a 20 pack year smoking history. Her mother smoked around her when she was a child. ? ?Past Medical History:  ?Diagnosis Date  ? Asthma   ? [place]   ? Depression 01/18/2013  ? Headache(784.0)   ? Heart murmur   ? [place]   ? Seasonal allergies   ? Tobacco abuse 01/18/2013  ?  ? ?Family History  ?Problem Relation Age of Onset  ? Diabetes Maternal Grandfather   ? Heart disease Maternal Grandfather   ? Cancer Maternal Grandfather   ? Hypertension Maternal Grandfather   ?  ? ?Social History  ? ?Socioeconomic History  ? Marital status: Single  ?  Spouse name: Not on file  ? Number of children: Not on file  ? Years of education: Not on file  ? Highest education level: Not on file  ?Occupational History  ? Not on file  ?Tobacco Use  ? Smoking status: Every Day  ?  Packs/day: 0.75  ?  Years: 30.00  ?  Pack years: 22.50  ?  Types: Cigarettes  ? Smokeless tobacco: Never  ?Vaping Use  ? Vaping Use: Never used  ?Substance and Sexual Activity  ? Alcohol use: Yes  ?  Alcohol/week: 0.0 standard drinks  ?  Comment: Occasional  ? Drug use: Yes  ?  Types: Marijuana  ?  Comment: Occasional  ? Sexual activity: Yes  ?  Partners: Male  ?  Birth control/protection: Condom  ?Other Topics Concern  ? Not on file  ?Social History Narrative  ? ** Merged History Encounter **  ? Lives with mother in a one story home.  Has 2 children.  Works as a Electrical engineer.   ? Education: some college.  ?    ? ?Social Determinants of Health  ? ?Financial Resource Strain: Not on file  ?Food Insecurity: Not on file  ?Transportation Needs: Not on file  ?Physical Activity: Not on file  ?Stress: Not on file  ?Social Connections: Not on file  ?Intimate Partner Violence: Not on file  ?  ? ?Allergies  ?Allergen Reactions  ? Dust Mite Extract Shortness Of Breath  ? Cymbalta [Duloxetine  Hcl] Other (See Comments)  ?  Suicidal  ? Escitalopram Oxalate Other (See Comments)  ?  Suicidal.  ? Other Other (See Comments)  ?  Pt is highly allergic to cats/dogs - causes shortness of breath  ?  ? ?Outpatient Medications Prior to Visit  ?Medication Sig Dispense Refill  ? albuterol (PROVENTIL) (2.5 MG/3ML) 0.083% nebulizer solution TAKE 3 MLS BY NEBULIZATION EVERY 6 HOURS AS NEEDED FOR WHEEZING OR SHORTNESS OF BREATH. (Patient taking differently: Take 2.5 mg by nebulization every 6 (six) hours as needed for wheezing or shortness of breath.) 75 mL 11  ? albuterol (VENTOLIN HFA) 108 (90 Base) MCG/ACT inhaler TAKE 2 PUFFS BY MOUTH EVERY 6 HOURS AS NEEDED FOR WHEEZE OR SHORTNESS OF BREATH 18 each 1  ? Cholecalciferol 5000 units capsule Take 5,000 Units by mouth every morning.    ? Fexofenadine HCl (ALLEGRA ALLERGY PO) Take by mouth.    ? Fluticasone Propionate (XHANCE) 93 MCG/ACT EXHU Place 1 spray into the nose in the morning and at bedtime. 16 mL 3  ? Fluticasone-Umeclidin-Vilant (TRELEGY ELLIPTA) 100-62.5-25 MCG/INH AEPB Inhale 1 puff into the lungs daily. 1 each 6  ? guaiFENesin (MUCINEX) 600 MG 12 hr tablet Take 2 tablets (1,200 mg total) by mouth 2 (two) times daily. 60 tablet 0  ? ipratropium-albuterol (DUONEB) 0.5-2.5 (3) MG/3ML SOLN Take 3 mLs by nebulization every 4 (four) hours as needed. 360 mL 0  ? PARoxetine (PAXIL) 40 MG tablet TAKE 1 TABLET BY MOUTH EVERY DAY IN THE MORNING (Patient taking differently: Take 40 mg by mouth daily after breakfast.) 90 tablet 1  ? rosuvastatin (CRESTOR) 10 MG tablet Take 1 tablet (10 mg total) by mouth daily. 90 tablet 3  ? sodium chloride HYPERTONIC 3 % nebulizer solution Take 4 mLs by nebulization 2 (two) times daily. 750 mL 12  ? SUPER B COMPLEX/C PO Take 1 tablet by mouth every morning.    ? traZODone (DESYREL) 50 MG tablet TAKE 2 TABLETS (100 MG TOTAL) BY MOUTH AT BEDTIME AS NEEDED FOR SLEEP. (Patient taking differently: Take 150 mg by mouth at bedtime.) 180 tablet  2  ? varenicline (CHANTIX) 0.5 MG tablet Take 0.5 mg orally once daily for 3 days, then 0.5 mg twice daily on days 4 through 7, and then 1 mg twice daily for 12 weeks. 95 tablet 0  ? montelukast (SINGULAIR) 10 MG tablet TAKE 1 TABLET BY MOUTH EVERY DAY 90 tablet 2  ? ?No facility-administered medications prior to visit.  ? ?Review of Systems  ?Constitutional:  Negative for chills, fever, malaise/fatigue and weight loss.  ?HENT:  Positive for congestion. Negative for sinus pain and sore throat.   ?Eyes: Negative.   ?  Respiratory:  Positive for cough, sputum production and wheezing. Negative for hemoptysis and shortness of breath.   ?Cardiovascular:  Negative for chest pain, palpitations, orthopnea, claudication and leg swelling.  ?Gastrointestinal:  Negative for abdominal pain, heartburn, nausea and vomiting.  ?Genitourinary: Negative.   ?Musculoskeletal:  Negative for joint pain and myalgias.  ?Skin:  Negative for rash.  ?Neurological:  Negative for weakness.  ?Endo/Heme/Allergies: Negative.   ?Psychiatric/Behavioral: Negative.    ? ?Objective:  ? ?Vitals:  ? 01/19/22 1010  ?BP: 124/80  ?Pulse: 82  ?SpO2: 96%  ?Weight: 177 lb 6.4 oz (80.5 kg)  ?Height: '5\' 5"'$  (1.651 m)  ? ? ?Physical Exam ?Constitutional:   ?   General: She is not in acute distress. ?   Appearance: She is not ill-appearing.  ?HENT:  ?   Head: Normocephalic and atraumatic.  ?Eyes:  ?   General: No scleral icterus. ?   Conjunctiva/sclera: Conjunctivae normal.  ?   Pupils: Pupils are equal, round, and reactive to light.  ?Cardiovascular:  ?   Rate and Rhythm: Normal rate and regular rhythm.  ?   Pulses: Normal pulses.  ?   Heart sounds: Normal heart sounds. No murmur heard. ?Pulmonary:  ?   Effort: Pulmonary effort is normal.  ?   Breath sounds: Wheezing present. No rhonchi or rales.  ?Musculoskeletal:  ?   Right lower leg: No edema.  ?   Left lower leg: No edema.  ?Skin: ?   General: Skin is warm and dry.  ?Neurological:  ?   General: No focal deficit  present.  ?   Mental Status: She is alert.  ?Psychiatric:     ?   Mood and Affect: Mood normal.     ?   Behavior: Behavior normal.     ?   Thought Content: Thought content normal.     ?   Judgment: Judgmen

## 2022-03-14 ENCOUNTER — Other Ambulatory Visit: Payer: Self-pay | Admitting: Family Medicine

## 2022-03-15 ENCOUNTER — Other Ambulatory Visit: Payer: Self-pay | Admitting: Family Medicine

## 2022-03-30 ENCOUNTER — Ambulatory Visit (INDEPENDENT_AMBULATORY_CARE_PROVIDER_SITE_OTHER): Payer: Medicare HMO | Admitting: Family Medicine

## 2022-03-30 ENCOUNTER — Encounter: Payer: Self-pay | Admitting: Family Medicine

## 2022-03-30 VITALS — BP 129/92 | HR 65 | Ht 65.0 in | Wt 177.4 lb

## 2022-03-30 DIAGNOSIS — M5432 Sciatica, left side: Secondary | ICD-10-CM | POA: Diagnosis not present

## 2022-03-30 MED ORDER — GABAPENTIN 100 MG PO CAPS: 100.0000 mg | ORAL_CAPSULE | Freq: Every day | ORAL | 3 refills | Status: DC

## 2022-03-30 NOTE — Progress Notes (Signed)
    SUBJECTIVE:   CHIEF COMPLAINT / HPI:   Sciatica Patient presents today complaining of low back pain which radiates down her left leg which has been going on for approximately 4 months.  She denies any injury to the leg and reports that the started occurring.  Feels like a sharp burning pain that starts right around her left buttocks and travels down the side of her left leg but does not go past the knee.  Denies any weakness in the leg.  Does report that she sometimes has difficulty walking due to pain.  Has been taking occasional Tylenol without much relief.   OBJECTIVE:   BP (!) 129/92   Pulse 65   Ht '5\' 5"'$  (1.651 m)   Wt 177 lb 6.4 oz (80.5 kg)   SpO2 100%   BMI 29.52 kg/m   General: Pleasant 61 year old female in no acute distress Cardiac: Regular rate Respiratory: Normal work of breathing, speaking in full sentences MSK: No visible injuries, full range of motion of the left hip and knee, no point tenderness to palpation other than just lateral to the lumbar spinous area  ASSESSMENT/PLAN:   Sciatica of left side Patient's history and physical exam consistent with sciatica.  Discussed stretching and rehab exercises.  Gave the option of physical therapy referral versus trying medications first.  Patient elected to use medications first and a trial 100 mg Neurontin.  Plan to follow-up in if need to titrate dose we can at that time or refer to physical therapy.  Return precautions given.  No further questions or concerns     Gifford Shave, MD [place]

## 2022-03-30 NOTE — Patient Instructions (Signed)
It was great seeing you today.  Your description of your pain and your physical exam are consistent with sciatica.  I say lets start with some low-dose gabapentin and see if that will help with the pain.  It may make you sleepy.  Take it at night.  If your symptoms do not improve with the gabapentin and the stretching exercises I am giving you please let us know and we can determine what the best referral for you is.  If you have any issues please give me a call.  I hope you have a great day!

## 2022-03-30 NOTE — Assessment & Plan Note (Signed)
Patient's history and physical exam consistent with sciatica.  Discussed stretching and rehab exercises.  Gave the option of physical therapy referral versus trying medications first.  Patient elected to use medications first and a trial 100 mg Neurontin.  Plan to follow-up in if need to titrate dose we can at that time or refer to physical therapy.  Return precautions given.  No further questions or concerns

## 2022-04-09 ENCOUNTER — Other Ambulatory Visit: Payer: Self-pay | Admitting: Pulmonary Disease

## 2022-04-09 DIAGNOSIS — J449 Chronic obstructive pulmonary disease, unspecified: Secondary | ICD-10-CM

## 2022-04-09 DIAGNOSIS — J4530 Mild persistent asthma, uncomplicated: Secondary | ICD-10-CM

## 2022-04-15 ENCOUNTER — Other Ambulatory Visit: Payer: Self-pay | Admitting: Pulmonary Disease

## 2022-04-15 DIAGNOSIS — J4530 Mild persistent asthma, uncomplicated: Secondary | ICD-10-CM

## 2022-04-15 DIAGNOSIS — J449 Chronic obstructive pulmonary disease, unspecified: Secondary | ICD-10-CM

## 2022-04-27 ENCOUNTER — Emergency Department (HOSPITAL_COMMUNITY): Payer: Medicare HMO

## 2022-04-27 ENCOUNTER — Encounter (HOSPITAL_COMMUNITY): Payer: Self-pay | Admitting: Emergency Medicine

## 2022-04-27 ENCOUNTER — Emergency Department (HOSPITAL_COMMUNITY)
Admission: EM | Admit: 2022-04-27 | Discharge: 2022-04-28 | Disposition: A | Discharge status: Home / Self Care | Payer: Medicare HMO | Source: Home / Self Care | Attending: Emergency Medicine | Admitting: Emergency Medicine

## 2022-04-27 ENCOUNTER — Other Ambulatory Visit: Payer: Self-pay

## 2022-04-27 ENCOUNTER — Ambulatory Visit
Admission: EM | Admit: 2022-04-27 | Discharge: 2022-04-27 | Disposition: A | Discharge status: Home / Self Care | Payer: Medicare HMO | Attending: Family Medicine | Admitting: Family Medicine

## 2022-04-27 ENCOUNTER — Emergency Department (HOSPITAL_COMMUNITY)
Admission: EM | Admit: 2022-04-27 | Discharge: 2022-04-27 | Discharge status: Against Medical Advice | Payer: Medicare HMO | Attending: Emergency Medicine | Admitting: Emergency Medicine

## 2022-04-27 DIAGNOSIS — R110 Nausea: Secondary | ICD-10-CM | POA: Insufficient documentation

## 2022-04-27 DIAGNOSIS — R079 Chest pain, unspecified: Secondary | ICD-10-CM | POA: Insufficient documentation

## 2022-04-27 DIAGNOSIS — Z7951 Long term (current) use of inhaled steroids: Secondary | ICD-10-CM | POA: Insufficient documentation

## 2022-04-27 DIAGNOSIS — R0789 Other chest pain: Secondary | ICD-10-CM | POA: Insufficient documentation

## 2022-04-27 DIAGNOSIS — N3090 Cystitis, unspecified without hematuria: Secondary | ICD-10-CM | POA: Insufficient documentation

## 2022-04-27 DIAGNOSIS — K5909 Other constipation: Secondary | ICD-10-CM | POA: Diagnosis not present

## 2022-04-27 DIAGNOSIS — J449 Chronic obstructive pulmonary disease, unspecified: Secondary | ICD-10-CM | POA: Diagnosis not present

## 2022-04-27 DIAGNOSIS — Z5321 Procedure and treatment not carried out due to patient leaving prior to being seen by health care provider: Secondary | ICD-10-CM | POA: Insufficient documentation

## 2022-04-27 DIAGNOSIS — R072 Precordial pain: Secondary | ICD-10-CM

## 2022-04-27 DIAGNOSIS — F32A Depression, unspecified: Secondary | ICD-10-CM | POA: Diagnosis not present

## 2022-04-27 LAB — CBC
HCT: 40.9 % (ref 36.0–46.0)
Hemoglobin: 14 g/dL (ref 12.0–15.0)
MCH: 31.7 pg (ref 26.0–34.0)
MCHC: 34.2 g/dL (ref 30.0–36.0)
MCV: 92.7 fL (ref 80.0–100.0)
Platelets: 248 10*3/uL (ref 150–400)
RBC: 4.41 MIL/uL (ref 3.87–5.11)
RDW: 12.1 % (ref 11.5–15.5)
WBC: 5.5 10*3/uL (ref 4.0–10.5)
nRBC: 0 % (ref 0.0–0.2)

## 2022-04-27 LAB — BASIC METABOLIC PANEL
Anion gap: 10 (ref 5–15)
BUN: 10 mg/dL (ref 6–20)
CO2: 21 mmol/L — ABNORMAL LOW (ref 22–32)
Calcium: 9.4 mg/dL (ref 8.9–10.3)
Chloride: 108 mmol/L (ref 98–111)
Creatinine, Ser: 0.69 mg/dL (ref 0.44–1.00)
GFR, Estimated: >60 mL/min (ref >60)
Glucose, Bld: 88 mg/dL (ref 70–99)
Potassium: 3.9 mmol/L (ref 3.5–5.1)
Sodium: 139 mmol/L (ref 135–145)

## 2022-04-27 LAB — I-STAT BETA HCG BLOOD, ED (MC, WL, AP ONLY): I-stat hCG, quantitative: 10 m[IU]/mL — ABNORMAL HIGH (ref <5)

## 2022-04-27 LAB — TROPONIN I (HIGH SENSITIVITY)
Troponin I (High Sensitivity): 4 ng/L (ref <18)
Troponin I (High Sensitivity): 3 ng/L (ref <18)
Troponin I (High Sensitivity): 6 ng/L (ref <18)

## 2022-04-27 MED ORDER — ASPIRIN 81 MG PO CHEW: 243.0000 mg | CHEWABLE_TABLET | Freq: Once | ORAL | Status: DC

## 2022-04-27 NOTE — ED Triage Notes (Addendum)
Pt reports L sided chest pain achy and pressure in nature. Was seen at ER last night but was 10 hr wait and she left after 4 hours and triage. Pt reports new onset of L arm numbness now as well. Denies sob, diaphoresis, cough. Pt reports taking baby Asprin today.   Pt reports she had an episode similar to this last week and she took asprin and she began to feel better.  Pt reports it started back yesterday.

## 2022-04-27 NOTE — ED Provider Notes (Addendum)
[facility]    CSN: [id number] Arrival date & time: 04/27/22  1234      History   Chief Complaint Chief Complaint  Patient presents with   Chest Pain   Numbness    HPI Lori Owens is a 61 y.o. female.    Chest Pain  Here with chest pain more on the left side of her chest since at least yesterday, possibly the day before.  Sometime yesterday it began radiating into her arm.  Is caused a feeling of numbness sometimes, and it is hurting now.  No fever or cough.  She has had decreased appetite.  No diaphoresis noted.  She went to the emergency room yesterday and had some lab evaluation.  She left due to the wait.  At that time EKG was done, and did, by my review, show some changes in the QRS shape in  leads III, avf and avl. and troponin high-sensitivity was 4.  She is a current smoker, and she had a similar episode last week for which she took an aspirin and the symptoms resolved  Past Medical History:  Diagnosis Date   Asthma    COPD (chronic obstructive pulmonary disease) ([place])    Depression 01/18/2013   Headache(784.0)    Heart murmur    Melanoma ([place])    Seasonal allergies    Tobacco abuse 01/18/2013    Patient Active Problem List   Diagnosis Date Noted   Sciatica of left side 03/30/2022   COPD (chronic obstructive pulmonary disease) ([place]) 01/08/2022   Aortic atherosclerosis ([place]) 12/22/2021   Anxiety and depression 09/06/2021   COPD (chronic obstructive pulmonary disease) with acute bronchitis ([place]) 09/06/2021   COPD exacerbation ([place]) 09/05/2021   Circulation problem 04/09/2020   Breast cancer screening by mammogram 01/24/2020   Difficulty sleeping 01/24/2020   Other abnormal glucose 01/24/2020   Healthcare maintenance 01/24/2020   Anxiety 04/24/2016   Exertional chest pain    MDD (major depressive disorder), recurrent severe, 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   Tobacco use disorder 05/07/2015   Obesity 05/03/2015   Excessive daytime  sleepiness 03/03/2015   Heart palpitations 12/24/2014   Snoring 12/23/2014   Witnessed apneic spells 12/23/2014   Cardiomyopathy ([place]) 09/19/2014   Mild persistent asthma 09/03/2014   Asthma exacerbation 09/02/2014   Suspicious mole 04/19/2013   SOB (shortness of breath) 04/19/2013   Hypokalemia 01/18/2013   Dehydration 01/18/2013   Tobacco abuse 01/18/2013   Tachycardia 01/18/2013    Past Surgical History:  Procedure Laterality Date   ABDOMINAL HYSTERECTOMY     NOSE SURGERY     polyp removal from nasal cavity    THROAT SURGERY     TONSILLECTOMY      OB History   No obstetric history on file.      Home Medications    Prior to Admission medications   Medication Sig Start Date End Date Taking? Authorizing Provider  albuterol (PROVENTIL) (2.5 MG/3ML) 0.083% nebulizer solution TAKE 3 MLS BY NEBULIZATION EVERY 6 HOURS AS NEEDED FOR WHEEZING OR SHORTNESS OF BREATH. Patient taking differently: Take 2.5 mg by nebulization every 6 (six) hours as needed for wheezing or shortness of breath. 06/08/21   Concepcion Living, MD  albuterol (VENTOLIN HFA) 108 (90 Base) MCG/ACT inhaler TAKE 2 PUFFS BY MOUTH EVERY 6 HOURS AS NEEDED FOR WHEEZING OR SHORTNESS OF BREATH. 04/15/22   Freddi Starr, MD  Cholecalciferol 5000 units capsule Take 5,000 Units by mouth every morning. Patient not taking: Reported on  04/27/2022 05/30/15   [provider]  Fexofenadine HCl (ALLEGRA ALLERGY PO) Take by mouth.    [provider]  Fluticasone Propionate (XHANCE) 93 MCG/ACT EXHU Place 1 spray into the nose in the morning and at bedtime. Patient not taking: Reported on 04/27/2022 11/20/21   Lilland, Alana, DO  gabapentin (NEURONTIN) 100 MG capsule Take 1 capsule (100 mg total) by mouth at bedtime. 03/30/22   Concepcion Living, MD  guaiFENesin (MUCINEX) 600 MG 12 hr tablet Take 2 tablets (1,200 mg total) by mouth 2 (two) times daily. Patient not taking: Reported on 04/27/2022 09/08/21   Debbe Odea,  MD  ipratropium-albuterol (DUONEB) 0.5-2.5 (3) MG/3ML SOLN Take 3 mLs by nebulization every 4 (four) hours as needed. 09/08/21   Debbe Odea, MD  montelukast (SINGULAIR) 10 MG tablet Take 1 tablet (10 mg total) by mouth daily. 01/19/22   Freddi Starr, MD  PARoxetine (PAXIL) 40 MG tablet TAKE 1 TABLET BY MOUTH EVERY DAY IN THE MORNING Patient taking differently: Take 40 mg by mouth daily after breakfast. 08/07/21   Cresenzo, Angelyn Punt, MD  rosuvastatin (CRESTOR) 10 MG tablet Take 1 tablet (10 mg total) by mouth daily. Patient not taking: Reported on 04/27/2022 12/22/21   Sharion Settler, DO  sodium chloride HYPERTONIC 3 % nebulizer solution Take 4 mLs by nebulization 2 (two) times daily. 01/19/22   Freddi Starr, MD  SUPER B COMPLEX/C PO Take 1 tablet by mouth every morning.    [provider]  traZODone (DESYREL) 50 MG tablet TAKE 2 TABLETS (100 MG TOTAL) BY MOUTH AT BEDTIME AS NEEDED FOR SLEEP. Patient taking differently: Take 150 mg by mouth at bedtime. 07/31/21   Concepcion Living, MD  TRELEGY ELLIPTA 100-62.5-25 MCG/ACT AEPB TAKE 1 PUFF BY MOUTH EVERY DAY 04/15/22   Freddi Starr, MD  varenicline (CHANTIX) 0.5 MG tablet TAKE 0.5 MG ORALLY ONCE DAILY FOR 3 DAYS, THEN 0.5 MG TWICE DAILY ON DAYS 4 THROUGH 7, AND THEN 1 MG TWICE DAILY FOR 12 WEEKS. Patient not taking: Reported on 04/27/2022 03/15/22   Concepcion Living, MD    Family History Family History  Problem Relation Age of Onset   Diabetes Maternal Grandfather    Heart disease Maternal Grandfather    Cancer Maternal Grandfather    Hypertension Maternal Grandfather     Social History Social History   Tobacco Use   Smoking status: Every Day    Packs/day: 0.75    Years: 30.00    Total pack years: 22.50    Types: Cigarettes   Smokeless tobacco: Never  Vaping Use   Vaping Use: Never used  Substance Use Topics   Alcohol use: Yes    Alcohol/week: 0.0 standard drinks of alcohol    Comment: Occasional   Drug use:  Yes    Types: Marijuana    Comment: Occasional     Allergies   Dust mite extract, Cymbalta [duloxetine hcl], Escitalopram oxalate, and Other   Review of Systems Review of Systems  Cardiovascular:  Positive for chest pain.     Physical Exam Triage Vital Signs ED Triage Vitals  Enc Vitals Group     BP 04/27/22 1252 (!) 160/104     Pulse Rate 04/27/22 1252 82     Resp 04/27/22 1252 18     Temp 04/27/22 1252 97.9 F (36.6 C)     Temp src --      SpO2 04/27/22 1252 94 %     Weight --  Height --      Head Circumference --      Peak Flow --      Pain Score 04/27/22 1246 6     Pain Loc --      Pain Edu? --      Excl. in [place]? --    No data found.  Updated Vital Signs BP (!) 160/104   Pulse 82   Temp 97.9 F (36.6 C)   Resp 18   SpO2 94%   Visual Acuity Right Eye Distance:   Left Eye Distance:   Bilateral Distance:    Right Eye Near:   Left Eye Near:    Bilateral Near:     Physical Exam Vitals reviewed.  Constitutional:      General: She is not in acute distress.    Appearance: She is not ill-appearing, toxic-appearing or diaphoretic.  HENT:     Nose: Nose normal.     Mouth/Throat:     Mouth: Mucous membranes are moist.     Pharynx: No oropharyngeal exudate or posterior oropharyngeal erythema.  Eyes:     Extraocular Movements: Extraocular movements intact.     Conjunctiva/sclera: Conjunctivae normal.     Pupils: Pupils are equal, round, and reactive to light.  Cardiovascular:     Rate and Rhythm: Normal rate and regular rhythm.     Heart sounds: No murmur heard. Pulmonary:     Effort: Pulmonary effort is normal.     Breath sounds: Normal breath sounds. No stridor. No rhonchi or rales.  Musculoskeletal:     Cervical back: Neck supple.  Lymphadenopathy:     Cervical: No cervical adenopathy.  Skin:    Coloration: Skin is not jaundiced or pale.  Neurological:     Mental Status: She is alert and oriented to person, place, and time.  Psychiatric:         Behavior: Behavior normal.      UC Treatments / Results  Labs (all labs ordered are listed, but only abnormal results are displayed) Labs Reviewed - No data to display  EKG   Radiology DG Chest 2 View  Result Date: 04/27/2022 CLINICAL DATA:  Chest pain. EXAM: CHEST - 2 VIEW COMPARISON:  September 05, 2021 FINDINGS: The heart size and mediastinal contours are within normal limits. There is no evidence of acute infiltrate, pleural effusion or pneumothorax. The visualized skeletal structures are unremarkable. IMPRESSION: No active cardiopulmonary disease. Electronically Signed   By: Virgina Norfolk M.D.   On: 04/27/2022 01:06    Procedures Procedures (including critical care time)  Medications Ordered in UC Medications - No data to display  Initial Impression / Assessment and Plan / UC Course  I have reviewed the triage vital signs and the nursing notes.  Pertinent labs & imaging results that were available during my care of the patient were reviewed by me and considered in my medical decision making (see chart for details).     Today the EKG still shows changes in the QRS shape and axis in leads III, aVL, and aVF.  There is no ST segment elevation or depression at this time.  With her symptom complex and the changes in her EKG from several months ago, I do feel it is best to be evaluated in the emergency room, with repeat troponin possibly  I think it best with her symptom complex, that she be transported there, and not drive herself Final Clinical Impressions(s) / UC Diagnoses   Final diagnoses:  Precordial pain  Discharge Instructions      I think you need to be evaluated in the emergency room.     ED Prescriptions   None    PDMP not reviewed this encounter.   Barrett Henle, MD 04/27/22 1321    Barrett Henle, MD 04/27/22 1328    Barrett Henle, MD 04/27/22 1330

## 2022-04-27 NOTE — ED Notes (Signed)
Called [facility] and they did not have trasnport.  Called [facility] for transport.

## 2022-04-27 NOTE — ED Triage Notes (Signed)
Patient reports left chest pain with mild SOB and nausea this evening , denies emesis or diaphoresis , no cough or fever .

## 2022-04-27 NOTE — ED Provider Notes (Signed)
[facility] EMERGENCY DEPARTMENT Provider Note   CSN: [id number] Arrival date & time: 04/27/22  1415     History {Add pertinent medical, surgical, social history, OB history to HPI:1} Chief Complaint  Patient presents with   Chest Pain    Lori Owens is a 61 y.o. female.  The history is provided by the patient and medical records.  Chest Pain Lori Owens is a 61 y.o. female who presents to the Emergency Department complaining of chest pain. One week ago.  Left side of sternum. Pain is constant.  Better with message.  No sob.  No change with activity.  Has nausea.  Decreased appetite. Has lower abdominal pain started today.  No dysuria. No D.  Has ongoing constipation - no worse than usual.  No edema.    Has COPD. Depression.  No blood clots.  Hx/o melenoma - back, stomach, chest, hands.  Several years ago, 7.      Home Medications Prior to Admission medications   Medication Sig Start Date End Date Taking? Authorizing Provider  albuterol (PROVENTIL) (2.5 MG/3ML) 0.083% nebulizer solution TAKE 3 MLS BY NEBULIZATION EVERY 6 HOURS AS NEEDED FOR WHEEZING OR SHORTNESS OF BREATH. Patient taking differently: Take 2.5 mg by nebulization every 6 (six) hours as needed for wheezing or shortness of breath. 06/08/21   Concepcion Living, MD  albuterol (VENTOLIN HFA) 108 (90 Base) MCG/ACT inhaler TAKE 2 PUFFS BY MOUTH EVERY 6 HOURS AS NEEDED FOR WHEEZING OR SHORTNESS OF BREATH. 04/15/22   Freddi Starr, MD  Cholecalciferol 5000 units capsule Take 5,000 Units by mouth every morning. Patient not taking: Reported on 04/27/2022 05/30/15   [provider]  Fexofenadine HCl (ALLEGRA ALLERGY PO) Take by mouth.    [provider]  Fluticasone Propionate (XHANCE) 93 MCG/ACT EXHU Place 1 spray into the nose in the morning and at bedtime. Patient not taking: Reported on 04/27/2022 11/20/21   Lilland, Alana, DO  gabapentin (NEURONTIN) 100 MG capsule Take 1 capsule  (100 mg total) by mouth at bedtime. 03/30/22   Concepcion Living, MD  guaiFENesin (MUCINEX) 600 MG 12 hr tablet Take 2 tablets (1,200 mg total) by mouth 2 (two) times daily. Patient not taking: Reported on 04/27/2022 09/08/21   Debbe Odea, MD  ipratropium-albuterol (DUONEB) 0.5-2.5 (3) MG/3ML SOLN Take 3 mLs by nebulization every 4 (four) hours as needed. 09/08/21   Debbe Odea, MD  montelukast (SINGULAIR) 10 MG tablet Take 1 tablet (10 mg total) by mouth daily. 01/19/22   Freddi Starr, MD  PARoxetine (PAXIL) 40 MG tablet TAKE 1 TABLET BY MOUTH EVERY DAY IN THE MORNING Patient taking differently: Take 40 mg by mouth daily after breakfast. 08/07/21   Cresenzo, Angelyn Punt, MD  rosuvastatin (CRESTOR) 10 MG tablet Take 1 tablet (10 mg total) by mouth daily. Patient not taking: Reported on 04/27/2022 12/22/21   Sharion Settler, DO  sodium chloride HYPERTONIC 3 % nebulizer solution Take 4 mLs by nebulization 2 (two) times daily. 01/19/22   Freddi Starr, MD  SUPER B COMPLEX/C PO Take 1 tablet by mouth every morning.    [provider]  traZODone (DESYREL) 50 MG tablet TAKE 2 TABLETS (100 MG TOTAL) BY MOUTH AT BEDTIME AS NEEDED FOR SLEEP. Patient taking differently: Take 150 mg by mouth at bedtime. 07/31/21   Concepcion Living, MD  TRELEGY ELLIPTA 100-62.5-25 MCG/ACT AEPB TAKE 1 PUFF BY MOUTH EVERY DAY 04/15/22   Freddi Starr, MD  varenicline (CHANTIX) 0.5 MG tablet TAKE 0.5 MG ORALLY ONCE DAILY FOR 3 DAYS, THEN 0.5 MG TWICE DAILY ON DAYS 4 THROUGH 7, AND THEN 1 MG TWICE DAILY FOR 12 WEEKS. Patient not taking: Reported on 04/27/2022 03/15/22   Concepcion Living, MD      Allergies    Dust mite extract, Cymbalta [duloxetine hcl], Escitalopram oxalate, and Other    Review of Systems   Review of Systems  Cardiovascular:  Positive for chest pain.    Physical Exam Updated Vital Signs BP 133/86 (BP Location: Left Arm)   Pulse 78   Temp 98.3 F (36.8 C) (Oral)   Resp 14   SpO2 96%   Physical Exam  ED Results / Procedures / Treatments   Labs (all labs ordered are listed, but only abnormal results are displayed) Labs Reviewed  I-STAT BETA HCG BLOOD, ED ([facility], [facility], [facility] ONLY)  TROPONIN I (HIGH SENSITIVITY)  TROPONIN I (HIGH SENSITIVITY)    EKG EKG Interpretation  Date/Time:  Tuesday April 27 2022 14:30:56 EDT Ventricular Rate:  74 PR Interval:  150 QRS Duration: 82 QT Interval:  390 QTC Calculation: 432 R Axis:   34 Text Interpretation: Normal sinus rhythm Normal ECG Confirmed by Quintella Reichert [phone]) on 04/27/2022 11:37:25 PM  Radiology DG Chest 2 View  Result Date: 04/27/2022 CLINICAL DATA:  Chest pain. EXAM: CHEST - 2 VIEW COMPARISON:  September 05, 2021 FINDINGS: The heart size and mediastinal contours are within normal limits. There is no evidence of acute infiltrate, pleural effusion or pneumothorax. The visualized skeletal structures are unremarkable. IMPRESSION: No active cardiopulmonary disease. Electronically Signed   By: Virgina Norfolk M.D.   On: 04/27/2022 01:06    Procedures Procedures  {Document cardiac monitor, telemetry assessment procedure when appropriate:1}  Medications Ordered in ED Medications - No data to display  ED Course/ Medical Decision Making/ A&P                           Medical Decision Making  ***  {Document critical care time when appropriate:1} {Document review of labs and clinical decision tools ie heart score, Chads2Vasc2 etc:1}  {Document your independent review of radiology images, and any outside records:1} {Document your discussion with family members, caretakers, and with consultants:1} {Document social determinants of health affecting pt's care:1} {Document your decision making why or why not admission, treatments were needed:1} Final Clinical Impression(s) / ED Diagnoses Final diagnoses:  None    Rx / DC Orders ED Discharge Orders     None

## 2022-04-27 NOTE — ED Notes (Signed)
Patient is being discharged from the Urgent Care and sent to the Emergency Department via [facility] . Per Dr Windy Carina, patient is in need of higher level of care due to needing further cardiac workup. Patient is aware and verbalizes understanding of plan of care.   Vitals:   04/27/22 1252  BP: (!) 160/104  Pulse: 82  Resp: 18  Temp: 97.9 F (36.6 C)  SpO2: 94%

## 2022-04-27 NOTE — ED Triage Notes (Signed)
Pt arrives via EMS for CP- central radiates to L arm and neck. 6/10. Constant pain. Pt complains of cotton mouth and increased urination. Pt came to ED last night for same but left before she could be seen. BP 160/100, 100% room air, 64 HR. Pt went to urgent care this morning and was sent here for eval.

## 2022-04-27 NOTE — ED Provider Triage Note (Signed)
  Emergency Medicine Provider Triage Evaluation Note  MRN:  [id number]  Arrival date & time: 04/27/22    Medically screening exam initiated at 12:26 AM.   CC:   Chest Pain   HPI:  Lori Owens is a 61 y.o. year-old female presents to the ED with chief complaint of chest pain that started about 5pm tonight.  Denies hx of the same.  Denies prior MI.  Pain has been intermittent.  Denies SOB.  Reports pain radiating to left arm.  Denies n/v, but reports no appetite.  Also has been  having to urinate a lot.Marland Kitchen  History provided by History provided by patient. ROS:  -As included in HPI PE:   Vitals:   04/27/22 0023  BP: (!) 163/103  Pulse: 65  Resp: 18  Temp: 97.9 F (36.6 C)  SpO2: 100%    Non-toxic appearing No respiratory distress  MDM:  Based on signs and symptoms, ACS is highest on my differential. Patient was informed that the remainder of the evaluation will be completed by another provider, this initial triage assessment does not replace that evaluation, and the importance of remaining in the ED until their evaluation is complete.    Montine Circle, PA-C 04/27/22 0028

## 2022-04-27 NOTE — Discharge Instructions (Addendum)
I think you need to be evaluated in the emergency room.

## 2022-04-27 NOTE — ED Notes (Addendum)
Pt left AMA.Pt said going to primary doctor tomorrow

## 2022-04-28 ENCOUNTER — Emergency Department (HOSPITAL_COMMUNITY): Payer: Medicare HMO

## 2022-04-28 ENCOUNTER — Other Ambulatory Visit: Payer: Self-pay | Admitting: Family Medicine

## 2022-04-28 DIAGNOSIS — J449 Chronic obstructive pulmonary disease, unspecified: Secondary | ICD-10-CM

## 2022-04-28 LAB — COMPREHENSIVE METABOLIC PANEL
ALT: 16 U/L (ref 0–44)
AST: 21 U/L (ref 15–41)
Albumin: 4.1 g/dL (ref 3.5–5.0)
Alkaline Phosphatase: 72 U/L (ref 38–126)
Anion gap: 8 (ref 5–15)
BUN: 12 mg/dL (ref 6–20)
CO2: 23 mmol/L (ref 22–32)
Calcium: 9.4 mg/dL (ref 8.9–10.3)
Chloride: 107 mmol/L (ref 98–111)
Creatinine, Ser: 0.84 mg/dL (ref 0.44–1.00)
GFR, Estimated: >60 mL/min (ref >60)
Glucose, Bld: 114 mg/dL — ABNORMAL HIGH (ref 70–99)
Potassium: 3.4 mmol/L — ABNORMAL LOW (ref 3.5–5.1)
Sodium: 138 mmol/L (ref 135–145)
Total Bilirubin: 0.6 mg/dL (ref 0.3–1.2)
Total Protein: 6.7 g/dL (ref 6.5–8.1)

## 2022-04-28 LAB — URINALYSIS, ROUTINE W REFLEX MICROSCOPIC
Bilirubin Urine: NEGATIVE
Glucose, UA: NEGATIVE mg/dL
Ketones, ur: NEGATIVE mg/dL
Nitrite: POSITIVE — AB
Protein, ur: NEGATIVE mg/dL
Specific Gravity, Urine: >1.046 — ABNORMAL HIGH (ref 1.005–1.030)
pH: 7 (ref 5.0–8.0)

## 2022-04-28 LAB — TROPONIN I (HIGH SENSITIVITY): Troponin I (High Sensitivity): 3 ng/L (ref <18)

## 2022-04-28 LAB — LIPASE, BLOOD: Lipase: 83 U/L — ABNORMAL HIGH (ref 11–51)

## 2022-04-28 MED ORDER — DICLOFENAC SODIUM 1 % EX GEL
2.0000 g | Freq: Once | CUTANEOUS | Status: DC
  Filled 2022-04-28: qty 100

## 2022-04-28 MED ORDER — NITROFURANTOIN MONOHYD MACRO 100 MG PO CAPS: 100.0000 mg | ORAL_CAPSULE | Freq: Two times a day (BID) | ORAL | 0 refills | Status: DC

## 2022-04-28 MED ORDER — ALUM & MAG HYDROXIDE-SIMETH 200-200-20 MG/5ML PO SUSP
30.0000 mL | Freq: Once | ORAL | Status: AC
  Administered 2022-04-28: 30 mL via ORAL
  Filled 2022-04-28: qty 30

## 2022-04-28 MED ORDER — PANTOPRAZOLE SODIUM 40 MG PO TBEC: 40.0000 mg | DELAYED_RELEASE_TABLET | Freq: Every day | ORAL | 0 refills | Status: AC

## 2022-04-28 MED ORDER — IOHEXOL 350 MG/ML SOLN
100.0000 mL | Freq: Once | INTRAVENOUS | Status: AC | PRN
  Administered 2022-04-28: 100 mL via INTRAVENOUS

## 2022-04-28 NOTE — ED Notes (Signed)
Patient made aware of need for urine sample.  States she is unable to provide a sample at this time

## 2022-04-28 NOTE — ED Notes (Signed)
Patient taken to CT at this time.

## 2022-04-30 LAB — URINE CULTURE: Culture: >=100000 — AB

## 2022-05-01 ENCOUNTER — Telehealth (HOSPITAL_BASED_OUTPATIENT_CLINIC_OR_DEPARTMENT_OTHER): Payer: Self-pay | Admitting: *Deleted

## 2022-05-10 ENCOUNTER — Ambulatory Visit: Payer: Medicare HMO | Admitting: Family Medicine

## 2022-05-20 ENCOUNTER — Other Ambulatory Visit: Payer: Self-pay | Admitting: Family Medicine

## 2022-05-26 NOTE — Progress Notes (Deleted)
    SUBJECTIVE:   CHIEF COMPLAINT / HPI: foster care assessment  Lori Owens is a 61-year-old female presenting with new foster parent and foster care agency [employer] representative. Patient has switched to new foster home as of last Thursday 10/5.  New foster parent - Tammy Simmons.  They are presenting with concern for constipation and foster care assessment requiring 72-hour screening form filled out.  Constipation: Positive parent denies seeing or knowing whether Lori has had a bowel movement since placement into new foster home with her.  Lori says she has had a bowel movement but cannot state when.  No nausea vomiting no fever.  Foster parent and [employer] worker do not know baseline bowel movement status for Lori.  PERTINENT  PMH / PSH: Emotional trauma, umbilical hernia  OBJECTIVE:   BP 98/62   Pulse 91   Ht 3' 5.93" (1.065 m)   Wt 35 lb 6.4 oz (16.1 kg)   SpO2 98%   BMI 14.16 kg/m   General: A&O, NAD HEENT: No sign of trauma, EOM grossly intact, moist mucous membranes Cardiac: RRR, no m/r/g Respiratory: CTAB, normal WOB GI: Soft, NTTP, non-distended, no rebound or guarding, 1cm reducible umbilical hernia, no obvious erythema or discoloration, non-tender Extremities: NTTP, no peripheral edema. Neuro: Moves all four extremities appropriately. Psych: Appropriate mood and affect   ASSESSMENT/PLAN:   Umbilical hernia without obstruction and without gangrene Exam reassuring. Previously seen by pediatric surgery who recommended surgical repair of hernia.  Based on previous chart review hernia has decreased in size to roughly 1 cm diameter from 1.8cm and may no longer require surgery as it appears to be closing.  Discussed with foster care parent that they should follow-up with pediatric surgery for whether or not they still wish to proceed with surgical repair.  Healthcare maintenance Patient received 61-year-old vaccinations today.  See below.  Patient will follow-up  in 1 month for 30-day repeat visit per foster care protocol as well as her annual preventative health visit.  Constipation Unknown status of last bowel movement, abdominal exam reassuring.  Discussed with foster care provider to write 2-week diarrhea bowel movements no quality and frequency.  Instructed to give 1 cap full of MiraLAX to patient if no recorded bowel movement in the next 72 hours.   Orders Placed This Encounter  Procedures   Kinrix (DTaP IPV combined vaccine)   Varicella vaccine subcutaneous   MMR vaccine subcutaneous     Sharlyne Koeneman, MD [facility]  

## 2022-05-27 ENCOUNTER — Other Ambulatory Visit: Payer: Self-pay | Admitting: Family Medicine

## 2022-05-27 ENCOUNTER — Ambulatory Visit: Payer: Medicare HMO | Admitting: Family Medicine

## 2022-06-16 ENCOUNTER — Other Ambulatory Visit: Payer: Self-pay | Admitting: Family Medicine

## 2022-06-16 ENCOUNTER — Other Ambulatory Visit: Payer: Self-pay | Admitting: Pulmonary Disease

## 2022-06-16 DIAGNOSIS — J4530 Mild persistent asthma, uncomplicated: Secondary | ICD-10-CM

## 2022-07-05 NOTE — Progress Notes (Unsigned)
    SUBJECTIVE:   CHIEF COMPLAINT / HPI:   Dysuria 61 y.o.  female presents with complaint of pain with voiding that started a week ago.  She also endorses increased frequency and urgency.  She is sexually active with one partner and inconsistent with protection. Denies vaginal discharge, itchiness or odor.  No hematuria or recent change in medication.   UTI in the past grew pan-sensitive Ecoli  LE Edema  Patient reports leg swelling for about a month. She has tried compression socks which helps a bit but stopped because it's uncomfortable.  Sedimentary life style, not working at the moment. Denies any chest pain, palpitations or shortness of breath Swim 3 times a weeks. Concerned about clot in legs due to family history of clots in the legs.   PERTINENT  PMH / PSH: MDD, COPD  OBJECTIVE:   BP (!) 147/90   Pulse 69   Ht '5\' 5"'$  (1.651 m)   Wt 178 lb 2 oz (80.8 kg)   SpO2 98%   BMI 29.64 kg/m    Physical Exam General: Alert, well appearing, NAD Cardiovascular: Well perfused Respiratory: Normal work of breathing on room air Extremities: No edema on extremities   Skin: Warm and dry  ASSESSMENT/PLAN:   Dysuria Patient complaining of dysuria with increased urinary frequency and urgency in the last 7 days.  POCT dipstick was consistent with UTI.  Started patient on Macrobid 100 mg twice daily for 5 days.  Elevated BP Patient with no diagnosis for hypertension presents with BP today of 147/90. -Recommend daily BP diary -Started patient on HCTZ 25 mg daily  Bilateral leg swellings Patient with reported intermittent bilateral lower extremity swellings and on exam found to have very coarse veins.  History and exam findings are consistent with venous insufficiency.  Low concerns for CHF exacerbation, thyroid disease or medication induced. -Courage patient to continue use of compression socks -Recommend elevation of legs -Rx HCTZ 25 mg daily for elevated BP and lower extremity  swellings.  HCM -Placed GI referral for Colonoscopy - Discussed Shingles and Tetanus vaccine are due. Patient plans to get shingles vaccine at the pharmacy.  Follow-up in 3 weeks for blood pressure check and lower extremity swelling.  Alen Bleacher, MD [facility]

## 2022-07-05 NOTE — Patient Instructions (Incomplete)
It was wonderful to meet you today. Thank you for allowing me to be a part of your care. Below is a short summary of what we discussed at your visit today:  Your urine test was consistent with UTI.  I placed an order for Macrobid 100 mg which you should take twice daily for 5 days.  Your blood pressure is elevated and I have started you on hydrochlorothiazide 25 mg  which you will take once daily.  This medication should help reduce the swelling in your legs and also your blood pressure.  Place GI referral for colonoscopy that is due.  Follow-up in 3 weeks to check your blood pressure and leg swelling.  You can get the Shingrix vaccine at the pharmacy. You will need a 2nd shot 2-6 months after the first. This vaccine is important to help prevent shingles.    Please bring all of your medications to every appointment!  If you have any questions or concerns, please do not hesitate to contact us via phone or MyChart message.   Alen Bleacher, MD [facility]

## 2022-07-06 ENCOUNTER — Ambulatory Visit (INDEPENDENT_AMBULATORY_CARE_PROVIDER_SITE_OTHER): Payer: Medicare HMO | Admitting: Student

## 2022-07-06 VITALS — BP 147/90 | HR 69 | Ht 65.0 in | Wt 178.1 lb

## 2022-07-06 DIAGNOSIS — R030 Elevated blood-pressure reading, without diagnosis of hypertension: Secondary | ICD-10-CM

## 2022-07-06 DIAGNOSIS — R300 Dysuria: Secondary | ICD-10-CM | POA: Diagnosis not present

## 2022-07-06 DIAGNOSIS — J339 Nasal polyp, unspecified: Secondary | ICD-10-CM

## 2022-07-06 DIAGNOSIS — Z1211 Encounter for screening for malignant neoplasm of colon: Secondary | ICD-10-CM

## 2022-07-06 DIAGNOSIS — Z8709 Personal history of other diseases of the respiratory system: Secondary | ICD-10-CM | POA: Diagnosis not present

## 2022-07-06 LAB — POCT URINALYSIS DIP (MANUAL ENTRY)
Bilirubin, UA: NEGATIVE
Glucose, UA: NEGATIVE mg/dL
Ketones, POC UA: NEGATIVE mg/dL
Nitrite, UA: NEGATIVE
Spec Grav, UA: 1.015 (ref 1.010–1.025)
Urobilinogen, UA: 1 E.U./dL
pH, UA: 7 (ref 5.0–8.0)

## 2022-07-06 LAB — POCT UA - MICROSCOPIC ONLY

## 2022-07-06 MED ORDER — HYDROCHLOROTHIAZIDE 25 MG PO TABS: 25.0000 mg | ORAL_TABLET | Freq: Every day | ORAL | 3 refills | Status: AC

## 2022-07-06 MED ORDER — NITROFURANTOIN MONOHYD MACRO 100 MG PO CAPS: 100.0000 mg | ORAL_CAPSULE | Freq: Two times a day (BID) | ORAL | 0 refills | Status: AC

## 2022-07-06 MED ORDER — COMFORT TOUCH BP CUFF/MEDIUM MISC: 0 refills | Status: AC

## 2022-08-03 ENCOUNTER — Encounter: Payer: Self-pay | Admitting: Family Medicine

## 2022-08-03 ENCOUNTER — Ambulatory Visit (INDEPENDENT_AMBULATORY_CARE_PROVIDER_SITE_OTHER): Payer: Medicare HMO | Admitting: Family Medicine

## 2022-08-03 ENCOUNTER — Ambulatory Visit (HOSPITAL_COMMUNITY)
Admission: RE | Admit: 2022-08-03 | Discharge: 2022-08-03 | Disposition: A | Discharge status: Home / Self Care | Payer: Medicare HMO | Source: Ambulatory Visit | Attending: Family Medicine | Admitting: Family Medicine

## 2022-08-03 VITALS — BP 125/86 | HR 75 | Ht 65.0 in | Wt 180.6 lb

## 2022-08-03 DIAGNOSIS — Z23 Encounter for immunization: Secondary | ICD-10-CM | POA: Diagnosis not present

## 2022-08-03 DIAGNOSIS — M7989 Other specified soft tissue disorders: Secondary | ICD-10-CM

## 2022-08-03 DIAGNOSIS — E669 Obesity, unspecified: Secondary | ICD-10-CM

## 2022-08-03 MED ORDER — PAROXETINE HCL 40 MG PO TABS: 40.0000 mg | ORAL_TABLET | Freq: Every day | ORAL | 0 refills | Status: DC

## 2022-08-03 MED ORDER — SEMAGLUTIDE(0.25 OR 0.5MG/DOS) 2 MG/1.5ML ~~LOC~~ SOPN: 0.2500 mg | PEN_INJECTOR | SUBCUTANEOUS | 0 refills | Status: AC

## 2022-08-03 MED ORDER — GABAPENTIN 100 MG PO CAPS: 100.0000 mg | ORAL_CAPSULE | Freq: Every day | ORAL | 0 refills | Status: DC

## 2022-08-03 NOTE — Assessment & Plan Note (Signed)
Sent in semaglutide starting dose. Advised that insurance may not cover this.

## 2022-08-03 NOTE — Progress Notes (Signed)
Lower extremity venous has been completed.   Preliminary results in CV Proc.   Lori Owens 08/03/2022 4:03 PM

## 2022-08-03 NOTE — Assessment & Plan Note (Signed)
Chronic with more recent recurrence for the past 6 months. No signs of CHF. DVT less likely given chronicity. Suspect this is most likely venous insufficiency with noted varicose veins. Will check labs to assess for hepatic, renal, and cardiac causes (CHF). - labs: CBC, CMP, BNP - DVT US (ordered after shared decision making) - recommended elevation, compression, reducing sodium intake, cutting back on smoking

## 2022-08-03 NOTE — Patient Instructions (Addendum)
It was nice seeing you today!  Things to work on: - elevate your legs whenever you can - use compression stockings - avoid sodium in your diet - cut back on smoking/quit altogether if you can  Go to your ultrasound as scheduled.  Will send in semaglutide once weekly injection to help with weight loss and appetite, hopefully insurance will cover this.  [facility]: [facility]   [phone], [place] [address]   [phone]    Stay well, Lori Button, MD [place] [phone]  --  Make sure to check out at the front desk before you leave today.  Please arrive at least 15 minutes prior to your scheduled appointments.  If you had blood work today, I will send you a MyChart message or a letter if results are normal. Otherwise, I will give you a call.  If you had a referral placed, they will call you to set up an appointment. Please give [place] a call if you don't hear back in the next 2 weeks.  If you need additional refills before your next appointment, please call your pharmacy first.

## 2022-08-03 NOTE — Addendum Note (Signed)
Addended by: Katharina Caper, Christan Defranco D on: 08/03/2022 05:12 PM   Modules accepted: Orders

## 2022-08-03 NOTE — Progress Notes (Signed)
    SUBJECTIVE:   CHIEF COMPLAINT / HPI:  Chief Complaint  Patient presents with   Leg Swelling    Patient reports she has had swelling in her right leg ongoing for about 6 months. Swelling fluctuates but she does wake up with swelling in her leg. She has had issues with leg swelling many years ago and previously had an ultrasound. Denies pain in her right leg. Denies chest pain, orthopnea, PND. She has varicose veins and does use compression stockings from time to time. No prior history of DVT.  She also would like something to help with appetite and weight loss. She notes that she has been stress eating more lately due to having to put her mother in a nursing home.  PERTINENT  PMH / PSH: COPD, asthma, obesity, tobacco use  Patient Care Team: Sharion Settler, DO as PCP - General (Family Medicine)   OBJECTIVE:   BP 125/86   Pulse 75   Ht '5\' 5"'$  (1.651 m)   Wt 180 lb 9.6 oz (81.9 kg)   SpO2 97%   BMI 30.05 kg/m   Physical Exam Constitutional:      General: She is not in acute distress. Cardiovascular:     Rate and Rhythm: Normal rate and regular rhythm.  Pulmonary:     Effort: Pulmonary effort is normal. No respiratory distress.     Breath sounds: Normal breath sounds.  Musculoskeletal:     Cervical back: Neck supple.     Comments: Mild RLE non-pitting edema to lower leg, mildly increased girth on right lower leg compared to left.  Skin:    Comments: Varicose veins noted, left worse than right  Neurological:     Mental Status: She is alert.         08/03/2022    1:59 PM  Depression screen PHQ 2/9  Decreased Interest 1  Down, Depressed, Hopeless 1  PHQ - 2 Score 2  Altered sleeping 1  Tired, decreased energy 1  Change in appetite 1  Feeling bad or failure about yourself  1  Trouble concentrating 0  Moving slowly or fidgety/restless 0  Suicidal thoughts 0  PHQ-9 Score 6  Difficult doing work/chores Somewhat difficult     Wt Readings from Last 3  Encounters:  08/03/22 180 lb 9.6 oz (81.9 kg)  07/06/22 178 lb 2 oz (80.8 kg)  03/30/22 177 lb 6.4 oz (80.5 kg)        ASSESSMENT/PLAN:   Obesity Sent in semaglutide starting dose. Advised that insurance may not cover this.  Right leg swelling Chronic with more recent recurrence for the past 6 months. No signs of CHF. DVT less likely given chronicity. Suspect this is most likely venous insufficiency with noted varicose veins. Will check labs to assess for hepatic, renal, and cardiac causes (CHF). - labs: CBC, CMP, BNP - DVT US (ordered after shared decision making) - recommended elevation, compression, reducing sodium intake, cutting back on smoking  Paroxetine and gabapentin refilled  HCM - flu vaccine given - Tdap given  Return in about 3 months (around 11/02/2022) for f/u leg swelling.   Zola Button, MD [place]

## 2022-08-04 ENCOUNTER — Telehealth: Payer: Self-pay

## 2022-08-04 NOTE — Telephone Encounter (Signed)
Patient calls nurse line stating she was not able to pick up two medications sent to pharmacy yesterday.   Patient reports Ozempic and Paxil were not there.   I spoke with pharmacy and Paxil was too soon to fill yesterday, however is going through today. Patient is able to pick this up now.   Ozempic is requiring a prior auth. Will forward to [place] to initiate.

## 2022-08-05 ENCOUNTER — Other Ambulatory Visit (HOSPITAL_COMMUNITY): Payer: Self-pay

## 2022-08-05 NOTE — Telephone Encounter (Signed)
A Prior Authorization was initiated for this patients OZEMPIC through CoverMyMeds.   Key: [id number]

## 2022-08-06 DIAGNOSIS — Z23 Encounter for immunization: Secondary | ICD-10-CM | POA: Diagnosis not present

## 2022-08-06 NOTE — Addendum Note (Signed)
Addended by: Katharina Caper, Ranya Fiddler D on: 08/06/2022 05:19 PM   Modules accepted: Orders

## 2022-08-06 NOTE — Telephone Encounter (Signed)
Prior Auth for patients medication OZEMPIC denied by [facility] via CoverMyMeds.   Reason: DRUGS USED TO LOSE WEIGHT ARE EXCLUDED FROM MEDICARE PART D COVERAGE  CoverMyMeds Key: [id number]

## 2022-09-22 ENCOUNTER — Ambulatory Visit
Admission: EM | Admit: 2022-09-22 | Discharge: 2022-09-22 | Disposition: A | Discharge status: Other Acute Inpatient Hospital | Payer: Medicare HMO

## 2022-10-03 ENCOUNTER — Other Ambulatory Visit: Payer: Self-pay | Admitting: Family Medicine

## 2022-10-07 ENCOUNTER — Ambulatory Visit (HOSPITAL_BASED_OUTPATIENT_CLINIC_OR_DEPARTMENT_OTHER): Admission: RE | Admit: 2022-10-07 | Payer: Medicare HMO | Source: Ambulatory Visit

## 2022-10-25 ENCOUNTER — Other Ambulatory Visit: Payer: Self-pay | Admitting: Family Medicine

## 2022-10-25 DIAGNOSIS — J4530 Mild persistent asthma, uncomplicated: Secondary | ICD-10-CM

## 2022-10-30 ENCOUNTER — Other Ambulatory Visit: Payer: Self-pay | Admitting: Family Medicine

## 2023-01-25 ENCOUNTER — Other Ambulatory Visit: Payer: Self-pay | Admitting: *Deleted

## 2023-01-25 MED ORDER — GABAPENTIN 100 MG PO CAPS: 100.0000 mg | ORAL_CAPSULE | Freq: Every day | ORAL | 0 refills | Status: AC

## 2023-02-05 ENCOUNTER — Other Ambulatory Visit: Payer: Self-pay | Admitting: Family Medicine

## 2023-03-16 ENCOUNTER — Telehealth: Payer: Self-pay | Admitting: Family Medicine

## 2023-03-16 NOTE — Telephone Encounter (Signed)
Contacted Lori Owens to schedule their annual wellness visit. Appointment made for 03/17/2023.  Thank you,  [facility] Support [facility] Direct dial  [phone]

## 2023-03-17 ENCOUNTER — Ambulatory Visit (INDEPENDENT_AMBULATORY_CARE_PROVIDER_SITE_OTHER): Payer: Medicare HMO

## 2023-03-17 DIAGNOSIS — Z0000 Encounter for general adult medical examination without abnormal findings: Secondary | ICD-10-CM

## 2023-03-17 NOTE — Patient Instructions (Signed)

## 2023-03-17 NOTE — Progress Notes (Signed)
I connected with  Lori Fredrickson. Blazier on 03/17/23 by a audio enabled telemedicine application and verified that I am speaking with the correct person using two identifiers.  Patient Location: Home  Provider Location: Home Office  I discussed the limitations of evaluation and management by telemedicine. The patient expressed understanding and agreed to proceed.   Subjective:   Lori Owens is a 62 y.o. female who presents for an Initial Medicare Annual Wellness Visit.  Review of Systems    Per HPI unless specifically indicated below.          Objective:       08/03/2022    1:58 PM 07/06/2022    9:55 AM 04/28/2022    4:23 AM  Vitals with BMI  Height 5\' 5"  5\' 5"    Weight 180 lbs 10 oz 178 lbs 2 oz   BMI 30.05 29.64   Systolic 125 147 161  Diastolic 86 90 98  Pulse 75 69 74    Today's Vitals   03/17/23 1436  PainSc: 0-No pain   There is no height or weight on file to calculate BMI.     08/03/2022    2:00 PM 07/06/2022    9:55 AM 04/27/2022    2:19 PM 01/08/2022    1:54 PM 11/20/2021    1:56 PM 09/06/2021   12:54 PM 09/05/2021   10:17 AM  Advanced Directives  Does Patient Have a Medical Advance Directive? No No No No No Yes Yes  Type of Careers adviser;Living will Healthcare Power of [place];Living will  Does patient want to make changes to medical advance directive?      No - Patient declined   Would patient like information on creating a medical advance directive?  No - Patient declined No - Patient declined No - Patient declined No - Patient declined No - Patient declined No - Patient declined    Current Medications (verified) Outpatient Encounter Medications as of 03/17/2023  Medication Sig   albuterol (PROVENTIL) (2.5 MG/3ML) 0.083% nebulizer solution TAKE 3 MLS BY NEBULIZATION EVERY 6 HOURS AS NEEDED FOR WHEEZING OR SHORTNESS OF BREATH. (Patient taking differently: Take 2.5 mg by nebulization every 6 (six) hours as needed for  wheezing or shortness of breath.)   albuterol (VENTOLIN HFA) 108 (90 Base) MCG/ACT inhaler TAKE 2 PUFFS BY MOUTH EVERY 6 HOURS AS NEEDED FOR WHEEZE OR SHORTNESS OF BREATH   Blood Pressure Monitoring (COMFORT TOUCH BP CUFF/MEDIUM) MISC Need daily blood pressure check   buPROPion (WELLBUTRIN) 100 MG tablet Take 100 mg by mouth 2 (two) times daily.   Cholecalciferol 5000 units capsule Take 5,000 Units by mouth every morning.   Fexofenadine HCl (ALLEGRA ALLERGY PO) Take by mouth.   hydrochlorothiazide (HYDRODIURIL) 25 MG tablet Take 1 tablet (25 mg total) by mouth daily.   montelukast (SINGULAIR) 10 MG tablet TAKE 1 TABLET BY MOUTH EVERY DAY   pantoprazole (PROTONIX) 40 MG tablet Take 1 tablet (40 mg total) by mouth daily.   PARoxetine (PAXIL) 40 MG tablet TAKE 1 TABLET BY MOUTH EVERY DAY   rosuvastatin (CRESTOR) 10 MG tablet Take 1 tablet (10 mg total) by mouth daily.   sodium chloride HYPERTONIC 3 % nebulizer solution TAKE 4 MLS BY NEBULIZATION 2 (TWO) TIMES DAILY.   SUPER B COMPLEX/C PO Take 1 tablet by mouth every morning.   traZODone (DESYREL) 50 MG tablet TAKE 2 TABLETS (100 MG TOTAL) BY MOUTH AT BEDTIME AS  NEEDED FOR SLEEP. (Patient taking differently: Take 150 mg by mouth at bedtime as needed for sleep.)   TRELEGY ELLIPTA 100-62.5-25 MCG/ACT AEPB TAKE 1 PUFF BY MOUTH EVERY DAY   gabapentin (NEURONTIN) 100 MG capsule Take 1 capsule (100 mg total) by mouth at bedtime. (Patient not taking: Reported on 03/17/2023)   nitrofurantoin, macrocrystal-monohydrate, (MACROBID) 100 MG capsule Take 1 capsule (100 mg total) by mouth 2 (two) times daily. (Patient not taking: Reported on 03/17/2023)   Semaglutide,0.25 or 0.5MG /DOS, 2 MG/1.5ML SOPN Inject 0.25 mg into the skin once a week. 0.25 mg once weekly for 4 weeks then increase to 0.5 mg weekly for at least 4 weeks,max 1 mg (Patient not taking: Reported on 03/17/2023)   No facility-administered encounter medications on file as of 03/17/2023.    Allergies  (verified) Dust mite extract, Cymbalta [duloxetine hcl], Escitalopram oxalate, and Other   History: Past Medical History:  Diagnosis Date   Asthma    COPD (chronic obstructive pulmonary disease) (HCC)    Depression 01/18/2013   Headache(784.0)    Heart murmur    Melanoma (HCC)    Seasonal allergies    Tobacco abuse 01/18/2013   Past Surgical History:  Procedure Laterality Date   ABDOMINAL HYSTERECTOMY     NOSE SURGERY     polyp removal from nasal cavity    THROAT SURGERY     TONSILLECTOMY     Family History  Problem Relation Age of Onset   Diabetes Maternal Grandfather    Heart disease Maternal Grandfather    Cancer Maternal Grandfather    Hypertension Maternal Grandfather    Social History   Socioeconomic History   Marital status: Single    Spouse name: Not on file   Number of children: 2   Years of education: Not on file   Highest education level: Not on file  Occupational History   Occupation: Disablility  Tobacco Use   Smoking status: Every Day    Packs/day: 0.25    Years: 30.00    Additional pack years: 0.00    Total pack years: 7.50    Types: Cigarettes   Smokeless tobacco: Never  Vaping Use   Vaping Use: Never used  Substance and Sexual Activity   Alcohol use: Yes    Alcohol/week: 0.0 standard drinks of alcohol    Comment: Occasional   Drug use: Yes    Types: Marijuana    Comment: Occasional   Sexual activity: Yes    Partners: Male    Birth control/protection: Condom  Other Topics Concern   Not on file  Social History Narrative   ** Merged History Encounter **   Lives with mother in a one story home.  Has 2 children.  Works as a Financial trader.    Education: some college.       Social Determinants of Health   Financial Resource Strain: Low Risk  (03/17/2023)   Overall Financial Resource Strain (CARDIA)    Difficulty of Paying Living Expenses: Not hard at all  Food Insecurity: No Food Insecurity (03/17/2023)   Hunger Vital Sign    Worried  About Running Out of Food in the Last Year: Never true    Ran Out of Food in the Last Year: Never true  Transportation Needs: No Transportation Needs (03/17/2023)   PRAPARE - Administrator, Civil Service (Medical): No    Lack of Transportation (Non-Medical): No  Physical Activity: Inactive (03/17/2023)   Exercise Vital Sign    Days of  Exercise per Week: 0 days    Minutes of Exercise per Session: 0 min  Stress: Stress Concern Present (03/17/2023)   [employer] of Occupational Health - Occupational Stress Questionnaire    Feeling of Stress : To some extent  Social Connections: Moderately Isolated (03/17/2023)   Social Connection and Isolation Panel [NHANES]    Frequency of Communication with Friends and Family: More than three times a week    Frequency of Social Gatherings with Friends and Family: Twice a week    Attends Religious Services: Never    Database administrator or Organizations: No    Attends Engineer, structural: Never    Marital Status: Living with partner    Tobacco Counseling Ready to quit: Not Answered Counseling given: No   Clinical Intake:     Pain : No/denies pain Pain Score: 0-No pain     Nutritional Status: BMI 25 -29 Overweight Nutritional Risks: Unintentional weight gain Diabetes: No  How often do you need to have someone help you when you read instructions, pamphlets, or other written materials from your doctor or pharmacy?: 1 - Never  Diabetic?No  Interpreter Needed?: No  Information entered by :: Laurel Dimmer, CMA   Activities of Daily Living    03/17/2023    2:36 PM  In your present state of health, do you have any difficulty performing the following activities:  Hearing? 0  Vision? 0  Difficulty concentrating or making decisions? 0  Walking or climbing stairs? 0  Dressing or bathing? 0  Doing errands, shopping? 0    Patient Care Team: Sabino Dick, DO as PCP - General (Family Medicine)  Indicate any  recent Medical Services you may have received from other than [facility] providers in the past year (date may be approximate).     Assessment:   This is a routine wellness examination for Lori Owens Hearing/Vision screen Denies any hearing issues. Denies any change to her vision.Annual Eye Exam.   Dietary issues and exercise activities discussed: Current Exercise Habits: The patient does not participate in regular exercise at present, Exercise limited by: None identified   Goals Addressed   None    Depression Screen    03/17/2023    2:35 PM 08/03/2022    1:59 PM 07/06/2022    9:54 AM 03/30/2022    9:35 AM 01/08/2022    1:53 PM 11/20/2021    1:56 PM 06/23/2021   10:21 AM  PHQ 2/9 Scores  PHQ - 2 Score 1 2 0 2 2 0 4  PHQ- 9 Score  6 6 7 5  0 18    Fall Risk    03/17/2023    2:36 PM 01/08/2022    1:53 PM 04/09/2020    2:23 PM 01/24/2020    2:54 PM 01/23/2016   10:06 AM  Fall Risk   Falls in the past year? 0 0 0 0 Yes  Number falls in past yr: 0 0  0 1  Injury with Fall? 0 0   Yes  Risk for fall due to : No Fall Risks      Follow up Falls evaluation completed   Falls evaluation completed Falls evaluation completed    FALL RISK PREVENTION PERTAINING TO THE HOME:  Any stairs in or around the home? No  If so, are there any without handrails? No  Home free of loose throw rugs in walkways, pet beds, electrical cords, etc? Yes  Adequate lighting in your home to reduce risk of falls? Yes  ASSISTIVE DEVICES UTILIZED TO PREVENT FALLS:  Life alert? No  Use of a cane, walker or w/c? No  Grab bars in the bathroom? Yes  Shower chair or bench in shower? No  Elevated toilet seat or a handicapped toilet? Yes   TIMED UP AND GO:  Was the test performed?Unable to perform, virtual appointment   Cognitive Function:        03/17/2023    2:38 PM  6CIT Screen  What Year? 0 points  What month? 0 points  What time? 0 points  Count back from 20 0 points  Months in reverse 0 points  Repeat phrase 0  points  Total Score 0 points    Immunizations Immunization History  Administered Date(s) Administered   Influenza Split 01/19/2013   Influenza,inj,Quad PF,6+ Mos 09/19/2014, 11/20/2021, 08/03/2022   Moderna SARS-COV2 Booster Vaccination 10/24/2020   Moderna Sars-Covid-2 Vaccination 02/06/2020, 03/05/2020   PNEUMOCOCCAL CONJUGATE-20 06/23/2021   Pfizer Covid-19 Vaccine Bivalent Booster 30yrs & up 11/20/2021   Tdap 08/06/2022    TDAP status: Up to date  Flu Vaccine status: Up to date  Pneumococcal vaccine status: Up to date  Covid-19 vaccine status: Information provided on how to obtain vaccines.   Qualifies for Shingles Vaccine? Yes   Zostavax completed Yes   Shingrix Completed?: Yes  Screening Tests Health Maintenance  Topic Date Due   COLONOSCOPY (Pts 45-78yrs Insurance coverage will need to be confirmed)  Never done   MAMMOGRAM  06/10/2018   COVID-19 Vaccine (4 - 2023-24 season) 07/09/2022   PAP SMEAR-Modifier  03/17/2023 (Originally 12/24/1981)   Zoster Vaccines- Shingrix (2 of 2) 04/26/2023   Lung Cancer Screening  04/29/2023   INFLUENZA VACCINE  06/09/2023   Medicare Annual Wellness (AWV)  03/16/2024   DTaP/Tdap/Td (2 - Td or Tdap) 08/06/2032   Hepatitis C Screening  Completed   HIV Screening  Completed   HPV VACCINES  Aged Out    Health Maintenance  Health Maintenance Due  Topic Date Due   COLONOSCOPY (Pts 45-23yrs Insurance coverage will need to be confirmed)  Never done   MAMMOGRAM  06/10/2018   COVID-19 Vaccine (4 - 2023-24 season) 07/09/2022    Colon Rectal Cancer Screening: The patient report that she recently relocated to [facility] and will get her new PCP to place a order for an colonoscopy.  Mammogram status: Ordered to have done in [place], [place] where she recently relocated to. Pt provided with contact info and advised to call to schedule appt.   DeXA Scan: not applicable   Lung Cancer Screening: (Low Dose CT Chest recommended if Age 18-80  years, 30 pack-year currently smoking OR have quit w/in 15years.) does not qualify.   Lung Cancer Screening Referral: not applicable   Additional Screening:  Hepatitis C Screening: does qualify; Completed 12/22/2021  Vision Screening: Recommended annual ophthalmology exams for early detection of glaucoma and other disorders of the eye. Is the patient up to date with their annual eye exam?  Yes  Who is the provider or what is the name of the office in which the patient attends annual eye exams?  If pt is not established with a provider, would they like to be referred to a provider to establish care? No .   Dental Screening: Recommended annual dental exams for proper oral hygiene  Community Resource Referral / Chronic Care Management: CRR required this visit?  No   CCM required this visit?  No      Plan:     I  have personally reviewed and noted the following in the patient's chart:   Medical and social history Use of alcohol, tobacco or illicit drugs  Current medications and supplements including opioid prescriptions. Patient is not currently taking opioid prescriptions. Functional ability and status Nutritional status Physical activity Advanced directives List of other physicians Hospitalizations, surgeries, and ER visits in previous 12 months Vitals Screenings to include cognitive, depression, and falls Referrals and appointments  In addition, I have reviewed and discussed with patient certain preventive protocols, quality metrics, and best practice recommendations. A written personalized care plan for preventive services as well as general preventive health recommendations were provided to patient.    Ms. Larrick , Thank you for taking time to come for your Medicare Wellness Visit. I appreciate your ongoing commitment to your health goals. Please review the following plan we discussed and let me know if I can assist you in the future.   These are the goals we  discussed:  Goals   None     This is a list of the screening recommended for you and due dates:  Health Maintenance  Topic Date Due   Colon Cancer Screening  Never done   Mammogram  06/10/2018   COVID-19 Vaccine (4 - 2023-24 season) 07/09/2022   Pap Smear  03/17/2023*   Zoster (Shingles) Vaccine (2 of 2) 04/26/2023   Screening for Lung Cancer  04/29/2023   Flu Shot  06/09/2023   Medicare Annual Wellness Visit  03/16/2024   DTaP/Tdap/Td vaccine (2 - Td or Tdap) 08/06/2032   Hepatitis C Screening: USPSTF Recommendation to screen - Ages 40-79 yo.  Completed   HIV Screening  Completed   HPV Vaccine  Aged Out  *Topic was postponed. The date shown is not the original due date.     Lonna Cobb, CMA   03/17/2023   Nurse Notes: Approximately 30 minute Non-Face -To-Face Medicare Wellness Visit. The patient reports recently relocating to [place], [place]. She also have established care with a new PCP.

## 2023-04-06 ENCOUNTER — Other Ambulatory Visit: Payer: Self-pay | Admitting: Family Medicine

## 2024-04-17 ENCOUNTER — Encounter: Payer: Self-pay | Admitting: *Deleted

## 2024-05-18 ENCOUNTER — Other Ambulatory Visit: Payer: Self-pay | Admitting: *Deleted

## 2024-05-21 MED ORDER — PAROXETINE HCL 40 MG PO TABS: 40.0000 mg | ORAL_TABLET | Freq: Every day | ORAL | 0 refills | Status: DC

## 2024-08-16 ENCOUNTER — Other Ambulatory Visit: Payer: Self-pay | Admitting: Family Medicine

## 2024-08-25 ENCOUNTER — Emergency Department (HOSPITAL_COMMUNITY)

## 2024-08-25 ENCOUNTER — Encounter (HOSPITAL_COMMUNITY): Payer: Self-pay

## 2024-08-25 ENCOUNTER — Other Ambulatory Visit: Payer: Self-pay

## 2024-08-25 ENCOUNTER — Emergency Department (HOSPITAL_COMMUNITY)
Admission: EM | Admit: 2024-08-25 | Discharge: 2024-08-25 | Discharge status: Against Medical Advice | Attending: Emergency Medicine | Admitting: Emergency Medicine

## 2024-08-25 DIAGNOSIS — M25512 Pain in left shoulder: Secondary | ICD-10-CM | POA: Diagnosis present

## 2024-08-25 DIAGNOSIS — Z5321 Procedure and treatment not carried out due to patient leaving prior to being seen by health care provider: Secondary | ICD-10-CM | POA: Insufficient documentation

## 2024-08-25 NOTE — ED Notes (Signed)
 Attempted to call pt with no response. Triage RN aware.

## 2024-08-25 NOTE — ED Triage Notes (Signed)
 Pt to ED from home with c/o L shoulder pain onset of a month ago. 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 Arrives A+O, VSS, NADN.

## 2024-08-25 NOTE — ED Notes (Signed)
 Attempted to call for vitals with no response

## 2024-08-26 ENCOUNTER — Emergency Department (HOSPITAL_BASED_OUTPATIENT_CLINIC_OR_DEPARTMENT_OTHER): Admission: EM | Admit: 2024-08-26 | Discharge: 2024-08-27 | Disposition: A | Discharge status: Home / Self Care

## 2024-08-26 ENCOUNTER — Encounter (HOSPITAL_BASED_OUTPATIENT_CLINIC_OR_DEPARTMENT_OTHER): Payer: Self-pay

## 2024-08-26 ENCOUNTER — Other Ambulatory Visit: Payer: Self-pay

## 2024-08-26 DIAGNOSIS — X58XXXA Exposure to other specified factors, initial encounter: Secondary | ICD-10-CM | POA: Diagnosis not present

## 2024-08-26 DIAGNOSIS — M67912 Unspecified disorder of synovium and tendon, left shoulder: Secondary | ICD-10-CM | POA: Insufficient documentation

## 2024-08-26 DIAGNOSIS — M25512 Pain in left shoulder: Secondary | ICD-10-CM | POA: Diagnosis present

## 2024-08-26 NOTE — ED Triage Notes (Signed)
 Pt c/o L shoulder pain x33mo ago. Advises pain starts in L elbow & goes up to shoulder. 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 Pt advises minimal relief w OTC meds. States she was seen at [facility] for same 10/18, would like to use imaging from then.

## 2024-08-27 MED ORDER — NAPROXEN 500 MG PO TABS: 500.0000 mg | ORAL_TABLET | Freq: Two times a day (BID) | ORAL | 0 refills | Status: AC

## 2024-08-27 MED ORDER — KETOROLAC TROMETHAMINE 15 MG/ML IJ SOLN
15.0000 mg | Freq: Once | INTRAMUSCULAR | Status: AC
  Administered 2024-08-27: 15 mg via INTRAMUSCULAR
  Filled 2024-08-27: qty 1

## 2024-08-27 MED ORDER — METHOCARBAMOL 500 MG PO TABS
1000.0000 mg | ORAL_TABLET | Freq: Once | ORAL | Status: AC
  Administered 2024-08-27: 1000 mg via ORAL
  Filled 2024-08-27: qty 2

## 2024-08-27 MED ORDER — METHOCARBAMOL 500 MG PO TABS: 1000.0000 mg | ORAL_TABLET | Freq: Three times a day (TID) | ORAL | 0 refills | Status: AC | PRN

## 2024-08-27 MED ORDER — NAPROXEN 500 MG PO TABS: 500.0000 mg | ORAL_TABLET | Freq: Two times a day (BID) | ORAL | 0 refills | Status: DC

## 2024-08-27 MED ORDER — METHOCARBAMOL 500 MG PO TABS: 1000.0000 mg | ORAL_TABLET | Freq: Three times a day (TID) | ORAL | 0 refills | Status: DC | PRN

## 2024-08-27 NOTE — ED Provider Notes (Signed)
 [place] EMERGENCY DEPARTMENT AT [facility] Provider Note   CSN: [id number] Arrival date & time: 08/26/24  2321     Patient presents with: No chief complaint on file.   Lori Owens is a 63 y.o. female.   63 year old female presents for evaluation of nontraumatic left shoulder pain that is chronic for about a month.  States she moves a family member around at home quite a bit if she is there primary caregiver, and also has been lifting up her 2 grandchildren.  States she had noticed some worsening of her shoulder pain and is taking 1 Aleve for today but does not seem to be helping.  She was at [facility] long yesterday for the same complaint had an x-ray but did not talk to a provider.        Prior to Admission medications   Medication Sig Start Date End Date Taking? Authorizing Provider  albuterol  (PROVENTIL ) (2.5 MG/3ML) 0.083% nebulizer solution TAKE 3 MLS BY NEBULIZATION EVERY 6 HOURS AS NEEDED FOR WHEEZING OR SHORTNESS OF BREATH. Patient taking differently: Take 2.5 mg by nebulization every 6 (six) hours as needed for wheezing or shortness of breath. 06/08/21   Cresenzo, John V, MD  albuterol  (VENTOLIN  HFA) [phone] Base) MCG/ACT inhaler TAKE 2 PUFFS BY MOUTH EVERY 6 HOURS AS NEEDED FOR WHEEZE OR SHORTNESS OF BREATH 10/25/22   Espinoza, Alejandra, DO  Blood Pressure Monitoring (COMFORT TOUCH BP CUFF/MEDIUM) MISC Need daily blood pressure check 07/06/22   Rosendo Rush, MD  buPROPion (WELLBUTRIN) 100 MG tablet Take 100 mg by mouth 2 (two) times daily.    [provider]  Cholecalciferol  5000 units capsule Take 5,000 Units by mouth every morning. 05/30/15   [provider]  Fexofenadine HCl (ALLEGRA ALLERGY PO) Take by mouth.    [provider]  gabapentin  (NEURONTIN ) 100 MG capsule Take 1 capsule (100 mg total) by mouth at bedtime. Patient not taking: Reported on 03/17/2023 01/25/23   Espinoza, Alejandra, DO  hydrochlorothiazide  (HYDRODIURIL ) 25 MG  tablet Take 1 tablet (25 mg total) by mouth daily. 07/06/22   Rosendo Rush, MD  methocarbamol (ROBAXIN) 500 MG tablet Take 2 tablets (1,000 mg total) by mouth every 8 (eight) hours as needed for up to 7 days for muscle spasms. 08/27/24 09/03/24  Britta Louth L, DO  montelukast  (SINGULAIR ) 10 MG tablet TAKE 1 TABLET BY MOUTH EVERY DAY 04/28/22   Cresenzo, John V, MD  naproxen (NAPROSYN) 500 MG tablet Take 1 tablet (500 mg total) by mouth 2 (two) times daily for 7 days. 08/27/24 09/03/24  Joah Patlan L, DO  nitrofurantoin , macrocrystal-monohydrate, (MACROBID ) 100 MG capsule Take 1 capsule (100 mg total) by mouth 2 (two) times daily. Patient not taking: Reported on 03/17/2023 07/06/22   Rosendo Rush, MD  pantoprazole  (PROTONIX ) 40 MG tablet Take 1 tablet (40 mg total) by mouth daily. 04/28/22   Griselda Norris, MD  PARoxetine  (PAXIL ) 40 MG tablet TAKE 1 TABLET (40 MG TOTAL) BY MOUTH DAILY. FOLLOW UP FOR FURTHER REFILLS 08/16/24   Nicholas Bar, MD  rosuvastatin  (CRESTOR ) 10 MG tablet Take 1 tablet (10 mg total) by mouth daily. 12/22/21   Espinoza, Alejandra, DO  Semaglutide ,0.25 or 0.5MG /DOS, 2 MG/1.5ML SOPN Inject 0.25 mg into the skin once a week. 0.25 mg once weekly for 4 weeks then increase to 0.5 mg weekly for at least 4 weeks,max 1 mg Patient not taking: Reported on 03/17/2023 08/03/22   Austin Ade, MD  sodium chloride  HYPERTONIC 3 % nebulizer  solution TAKE 4 MLS BY NEBULIZATION 2 (TWO) TIMES DAILY. 06/16/22   Kara Dorn NOVAK, MD  SUPER B COMPLEX/C PO Take 1 tablet by mouth every morning.    [provider]  traZODone  (DESYREL ) 50 MG tablet TAKE 2 TABLETS (100 MG TOTAL) BY MOUTH AT BEDTIME AS NEEDED FOR SLEEP. Patient taking differently: Take 150 mg by mouth at bedtime as needed for sleep. 10/04/22   Espinoza, Alejandra, DO  TRELEGY ELLIPTA  100-62.5-25 MCG/ACT AEPB TAKE 1 PUFF BY MOUTH EVERY DAY 04/15/22   Kara Dorn NOVAK, MD    Allergies: Dust mite extract, Cymbalta [duloxetine  hcl], Escitalopram  oxalate, and Other    Review of Systems  Constitutional:  Negative for chills and fever.  HENT:  Negative for ear pain and sore throat.   Eyes:  Negative for pain and visual disturbance.  Respiratory:  Negative for cough and shortness of breath.   Cardiovascular:  Negative for chest pain and palpitations.  Gastrointestinal:  Negative for abdominal pain and vomiting.  Genitourinary:  Negative for dysuria and hematuria.  Musculoskeletal:  Negative for arthralgias and back pain.       Admits left shoulder pain  Skin:  Negative for color change and rash.  Neurological:  Negative for seizures and syncope.  All other systems reviewed and are negative.   Updated Vital Signs BP (!) 146/92 (BP Location: Right Arm)   Pulse 80   Temp 97.8 F (36.6 C) (Oral)   Resp 19   SpO2 100%   Physical Exam Vitals and nursing note reviewed.  Constitutional:      General: She is not in acute distress.    Appearance: Normal appearance. She is well-developed. She is not ill-appearing.  HENT:     Head: Normocephalic and atraumatic.  Eyes:     Conjunctiva/sclera: Conjunctivae normal.  Cardiovascular:     Rate and Rhythm: Normal rate and regular rhythm.     Heart sounds: No murmur heard. Pulmonary:     Effort: Pulmonary effort is normal. No respiratory distress.     Breath sounds: Normal breath sounds.  Abdominal:     Palpations: Abdomen is soft.     Tenderness: There is no abdominal tenderness.  Musculoskeletal:        General: No swelling.     Cervical back: Neck supple.     Comments: There is mild tenderness to palpation over the right posterior shoulder over the shoulder blade at the mall bicipital wall origin tenderness to palpation but full range of motion of the arm, neurovascularly intact, no swelling or deformity noted  Skin:    General: Skin is warm and dry.     Capillary Refill: Capillary refill takes less than 2 seconds.  Neurological:     Mental Status: She is  alert.  Psychiatric:        Mood and Affect: Mood normal.     (all labs ordered are listed, but only abnormal results are displayed) Labs Reviewed - No data to display  EKG: None  Radiology: No results found.   Procedures   Medications Ordered in the ED  ketorolac  (TORADOL ) 15 MG/ML injection 15 mg (15 mg Intramuscular Given 08/27/24 0054)  methocarbamol (ROBAXIN) tablet 1,000 mg (1,000 mg Oral Given 08/27/24 0056)                                    Medical Decision Making Patient wanted to use imaging  from yesterday.  It was reviewed by me and showed no acute abnormality.  I think she likely has a tendinopathy of her shoulder/rotator cuff.  Will give her prescription for Robaxin and naproxen.  She is given Toradol  here and advised Tylenol  as needed for pain.  Advised close follow-up with primary care otherwise return to the ER for new or worsening symptoms.  She feels comfortable being discharged home  Problems Addressed: Tendinopathy of left rotator cuff: acute illness or injury  Amount and/or Complexity of Data Reviewed External Data Reviewed: notes.    Details: Prior ED records reviewed and patient was seen yesterday at [facility] and had imaging of her shoulder that was unremarkable  Risk OTC drugs. Prescription drug management.     Final diagnoses:  Tendinopathy of left rotator cuff    ED Discharge Orders          Ordered    naproxen (NAPROSYN) 500 MG tablet  2 times daily,   Status:  Discontinued        08/27/24 0014    methocarbamol (ROBAXIN) 500 MG tablet  Every 8 hours PRN,   Status:  Discontinued        08/27/24 0014    methocarbamol (ROBAXIN) 500 MG tablet  Every 8 hours PRN        08/27/24 0101    naproxen (NAPROSYN) 500 MG tablet  2 times daily        08/27/24 0101               Jerzi Tigert L, DO 08/27/24 [phone]

## 2024-08-27 NOTE — Discharge Instructions (Signed)
 You should take your naproxen as prescribed and your Robaxin as needed.  You can also take Tylenol  as needed.  Follow-up with your primary care doctor if your symptoms do not improve.

## 2024-08-27 NOTE — ED Notes (Signed)
 Pt discharged home after verbalizing understanding of discharge instructions; nad noted.

## 2024-11-03 ENCOUNTER — Emergency Department (HOSPITAL_COMMUNITY)

## 2024-11-03 ENCOUNTER — Emergency Department (HOSPITAL_COMMUNITY)
Admission: EM | Admit: 2024-11-03 | Discharge: 2024-11-03 | Disposition: A | Discharge status: Home / Self Care | Attending: Emergency Medicine | Admitting: Emergency Medicine

## 2024-11-03 ENCOUNTER — Encounter (HOSPITAL_COMMUNITY): Payer: Self-pay

## 2024-11-03 ENCOUNTER — Other Ambulatory Visit: Payer: Self-pay

## 2024-11-03 DIAGNOSIS — R062 Wheezing: Secondary | ICD-10-CM | POA: Diagnosis not present

## 2024-11-03 DIAGNOSIS — J449 Chronic obstructive pulmonary disease, unspecified: Secondary | ICD-10-CM | POA: Diagnosis not present

## 2024-11-03 DIAGNOSIS — J441 Chronic obstructive pulmonary disease with (acute) exacerbation: Secondary | ICD-10-CM

## 2024-11-03 DIAGNOSIS — R0602 Shortness of breath: Secondary | ICD-10-CM | POA: Diagnosis present

## 2024-11-03 LAB — BASIC METABOLIC PANEL WITH GFR
Anion gap: 9 (ref 5–15)
BUN: 21 mg/dL (ref 8–23)
CO2: 26 mmol/L (ref 22–32)
Calcium: 9.7 mg/dL (ref 8.9–10.3)
Chloride: 108 mmol/L (ref 98–111)
Creatinine, Ser: 0.74 mg/dL (ref 0.44–1.00)
GFR, Estimated: >60 mL/min
Glucose, Bld: 87 mg/dL (ref 70–99)
Potassium: 4.6 mmol/L (ref 3.5–5.1)
Sodium: 142 mmol/L (ref 135–145)

## 2024-11-03 LAB — CBC WITH DIFFERENTIAL/PLATELET
Abs Immature Granulocytes: 0.02 K/uL (ref 0.00–0.07)
Basophils Absolute: 0.1 K/uL (ref 0.0–0.1)
Basophils Relative: 2 %
Eosinophils Absolute: 0.6 K/uL — ABNORMAL HIGH (ref 0.0–0.5)
Eosinophils Relative: 13 %
HCT: 39.5 % (ref 36.0–46.0)
Hemoglobin: 12.7 g/dL (ref 12.0–15.0)
Immature Granulocytes: 1 %
Lymphocytes Relative: 25 %
Lymphs Abs: 1.1 K/uL (ref 0.7–4.0)
MCH: 31.4 pg (ref 26.0–34.0)
MCHC: 32.2 g/dL (ref 30.0–36.0)
MCV: 97.8 fL (ref 80.0–100.0)
Monocytes Absolute: 0.4 K/uL (ref 0.1–1.0)
Monocytes Relative: 8 %
Neutro Abs: 2.3 K/uL (ref 1.7–7.7)
Neutrophils Relative %: 51 %
Platelets: 252 K/uL (ref 150–400)
RBC: 4.04 MIL/uL (ref 3.87–5.11)
RDW: 12.8 % (ref 11.5–15.5)
WBC: 4.4 K/uL (ref 4.0–10.5)
nRBC: 0 % (ref 0.0–0.2)

## 2024-11-03 LAB — RESP PANEL BY RT-PCR (RSV, FLU A&B, COVID)  RVPGX2
Influenza A by PCR: NEGATIVE
Influenza B by PCR: NEGATIVE
Resp Syncytial Virus by PCR: NEGATIVE
SARS Coronavirus 2 by RT PCR: NEGATIVE

## 2024-11-03 LAB — D-DIMER, QUANTITATIVE: D-Dimer, Quant: 1.04 ug{FEU}/mL — ABNORMAL HIGH (ref 0.00–0.50)

## 2024-11-03 LAB — TROPONIN T, HIGH SENSITIVITY: Troponin T High Sensitivity: <15 ng/L (ref 0–19)

## 2024-11-03 MED ORDER — PREDNISONE 10 MG PO TABS: 20.0000 mg | ORAL_TABLET | Freq: Every day | ORAL | 0 refills | Status: AC

## 2024-11-03 MED ORDER — DOXYCYCLINE HYCLATE 100 MG PO CAPS: 100.0000 mg | ORAL_CAPSULE | Freq: Two times a day (BID) | ORAL | 0 refills | Status: DC

## 2024-11-03 MED ORDER — PREDNISONE 10 MG PO TABS: 20.0000 mg | ORAL_TABLET | Freq: Every day | ORAL | 0 refills | Status: DC

## 2024-11-03 MED ORDER — IOHEXOL 350 MG/ML SOLN
75.0000 mL | Freq: Once | INTRAVENOUS | Status: AC | PRN
  Administered 2024-11-03: 75 mL via INTRAVENOUS

## 2024-11-03 MED ORDER — TRELEGY ELLIPTA 100-62.5-25 MCG/ACT IN AEPB: 1.0000 | INHALATION_SPRAY | Freq: Every day | RESPIRATORY_TRACT | 0 refills | Status: AC

## 2024-11-03 MED ORDER — PREDNISONE 20 MG PO TABS
60.0000 mg | ORAL_TABLET | Freq: Once | ORAL | Status: AC
  Administered 2024-11-03: 60 mg via ORAL
  Filled 2024-11-03: qty 3

## 2024-11-03 MED ORDER — DOXYCYCLINE HYCLATE 100 MG PO CAPS: 100.0000 mg | ORAL_CAPSULE | Freq: Two times a day (BID) | ORAL | 0 refills | Status: AC

## 2024-11-03 MED ORDER — IPRATROPIUM-ALBUTEROL 0.5-2.5 (3) MG/3ML IN SOLN
3.0000 mL | Freq: Once | RESPIRATORY_TRACT | Status: AC
  Administered 2024-11-03: 3 mL via RESPIRATORY_TRACT
  Filled 2024-11-03: qty 3

## 2024-11-03 MED ORDER — TRELEGY ELLIPTA 100-62.5-25 MCG/ACT IN AEPB: 1.0000 | INHALATION_SPRAY | Freq: Every day | RESPIRATORY_TRACT | 0 refills | Status: DC

## 2024-11-03 MED ORDER — DOXYCYCLINE HYCLATE 100 MG PO TABS
100.0000 mg | ORAL_TABLET | Freq: Once | ORAL | Status: AC
  Administered 2024-11-03: 100 mg via ORAL
  Filled 2024-11-03: qty 1

## 2024-11-03 NOTE — Discharge Instructions (Signed)
 You have been seen and discharged from the emergency department.  Take steroids starting tomorrow as directed.  You were given your first dose of antibiotic here in the department.  Continue the antibiotic medicine starting tomorrow.  Continue to use your breathing treatments/nebulizers.  A refill of Trelegy has been sent.  Follow-up with your primary provider for further evaluation and further care. Take home medications as prescribed. If you have any worsening symptoms or further concerns for your health please return to an emergency department for further evaluation.

## 2024-11-03 NOTE — ED Triage Notes (Signed)
 Patient reports recently moved from [place] and has COPD exacerbations and usually can do neb treatments and it gets better but it is not and she is having pain in left neck and left arm.  Denies chest pain.  Reports nebulizer doesn't help her breathe better now.  Denies flu like symptoms.

## 2024-11-03 NOTE — ED Notes (Signed)
 Patient transported to CT

## 2024-11-03 NOTE — ED Provider Notes (Signed)
 " [place] EMERGENCY DEPARTMENT AT [facility] Provider Note   CSN: [id number] Arrival date & time: 11/03/24  1019     Patient presents with: Shortness of Breath   Lori Owens is a 63 y.o. female.    Shortness of Breath    Pt states she has been feeling short of breath for the last several days.  She does not feel like she been catching her breath.  She has COPD and does feel like she has been wheezing but feels like it is more than she is also having pain in her chest and arm.  She denies any fevers.  No history of heart disease.  Patient still smokes some although is trying to quit  Prior to Admission medications  Medication Sig Start Date End Date Taking? Authorizing Provider  albuterol  (PROVENTIL ) (2.5 MG/3ML) 0.083% nebulizer solution TAKE 3 MLS BY NEBULIZATION EVERY 6 HOURS AS NEEDED FOR WHEEZING OR SHORTNESS OF BREATH. Patient taking differently: Take 2.5 mg by nebulization every 6 (six) hours as needed for wheezing or shortness of breath. 06/08/21   Cresenzo, John V, MD  albuterol  (VENTOLIN  HFA) 108 (90 Base) MCG/ACT inhaler TAKE 2 PUFFS BY MOUTH EVERY 6 HOURS AS NEEDED FOR WHEEZE OR SHORTNESS OF BREATH 10/25/22   Espinoza, Alejandra, DO  Blood Pressure Monitoring (COMFORT TOUCH BP CUFF/MEDIUM) MISC Need daily blood pressure check 07/06/22   Rosendo Norleen BROCKS, MD  buPROPion (WELLBUTRIN) 100 MG tablet Take 100 mg by mouth 2 (two) times daily.    [provider]  Cholecalciferol  5000 units capsule Take 5,000 Units by mouth every morning. 05/30/15   [provider]  Fexofenadine HCl (ALLEGRA ALLERGY PO) Take by mouth.    [provider]  gabapentin  (NEURONTIN ) 100 MG capsule Take 1 capsule (100 mg total) by mouth at bedtime. Patient not taking: Reported on 03/17/2023 01/25/23   Espinoza, Alejandra, DO  hydrochlorothiazide  (HYDRODIURIL ) 25 MG tablet Take 1 tablet (25 mg total) by mouth daily. 07/06/22   Rosendo Norleen BROCKS, MD  montelukast  (SINGULAIR )  10 MG tablet TAKE 1 TABLET BY MOUTH EVERY DAY 04/28/22   Cresenzo, Norleen GAILS, MD  nitrofurantoin , macrocrystal-monohydrate, (MACROBID ) 100 MG capsule Take 1 capsule (100 mg total) by mouth 2 (two) times daily. Patient not taking: Reported on 03/17/2023 07/06/22   Rosendo Norleen BROCKS, MD  pantoprazole  (PROTONIX ) 40 MG tablet Take 1 tablet (40 mg total) by mouth daily. 04/28/22   Griselda Norris, MD  PARoxetine  (PAXIL ) 40 MG tablet TAKE 1 TABLET (40 MG TOTAL) BY MOUTH DAILY. FOLLOW UP FOR FURTHER REFILLS 08/16/24   Nicholas Bar, MD  rosuvastatin  (CRESTOR ) 10 MG tablet Take 1 tablet (10 mg total) by mouth daily. 12/22/21   Espinoza, Alejandra, DO  Semaglutide ,0.25 or 0.5MG /DOS, 2 MG/1.5ML SOPN Inject 0.25 mg into the skin once a week. 0.25 mg once weekly for 4 weeks then increase to 0.5 mg weekly for at least 4 weeks,max 1 mg Patient not taking: Reported on 03/17/2023 08/03/22   Austin Ade, MD  sodium chloride  HYPERTONIC 3 % nebulizer solution TAKE 4 MLS BY NEBULIZATION 2 (TWO) TIMES DAILY. 06/16/22   Kara Dorn NOVAK, MD  SUPER B COMPLEX/C PO Take 1 tablet by mouth every morning.    [provider]  traZODone  (DESYREL ) 50 MG tablet TAKE 2 TABLETS (100 MG TOTAL) BY MOUTH AT BEDTIME AS NEEDED FOR SLEEP. Patient taking differently: Take 150 mg by mouth at bedtime as needed for sleep. 10/04/22   Espinoza, Alejandra, DO  TRELEGY ELLIPTA  100-62.5-25 MCG/ACT AEPB TAKE 1 PUFF BY MOUTH EVERY DAY 04/15/22   Kara Dorn NOVAK, MD    Allergies: Dust mite extract, Cymbalta [duloxetine hcl], Escitalopram  oxalate, and Other    Review of Systems  Respiratory:  Positive for shortness of breath.     Updated Vital Signs BP 133/89   Pulse 70   Temp (!) 97.5 F (36.4 C) (Oral)   Resp 16   Ht 1.651 m (5' 5)   Wt 82 kg   SpO2 95%   BMI 30.08 kg/m   Physical Exam Vitals and nursing note reviewed.  Constitutional:      General: She is not in acute distress.    Appearance: She is well-developed.  HENT:      Head: Normocephalic and atraumatic.     Right Ear: External ear normal.     Left Ear: External ear normal.  Eyes:     General: No scleral icterus.       Right eye: No discharge.        Left eye: No discharge.     Conjunctiva/sclera: Conjunctivae normal.  Neck:     Trachea: No tracheal deviation.  Cardiovascular:     Rate and Rhythm: Normal rate and regular rhythm.  Pulmonary:     Effort: Pulmonary effort is normal. No respiratory distress.     Breath sounds: No stridor. Wheezing present. No rales.  Abdominal:     General: Bowel sounds are normal. There is no distension.     Palpations: Abdomen is soft.     Tenderness: There is no abdominal tenderness. There is no guarding or rebound.  Musculoskeletal:        General: No tenderness or deformity.     Cervical back: Neck supple.  Skin:    General: Skin is warm and dry.     Findings: No rash.  Neurological:     General: No focal deficit present.     Mental Status: She is alert.     Cranial Nerves: No cranial nerve deficit, dysarthria or facial asymmetry.     Sensory: No sensory deficit.     Motor: No abnormal muscle tone or seizure activity.     Coordination: Coordination normal.  Psychiatric:        Mood and Affect: Mood normal.     (all labs ordered are listed, but only abnormal results are displayed) Labs Reviewed  CBC WITH DIFFERENTIAL/PLATELET - Abnormal; Notable for the following components:      Result Value   Eosinophils Absolute 0.6 (*)    All other components within normal limits  D-DIMER, QUANTITATIVE - Abnormal; Notable for the following components:   D-Dimer, Quant 1.04 (*)    All other components within normal limits  RESP PANEL BY RT-PCR (RSV, FLU A&B, COVID)  [id number]  BASIC METABOLIC PANEL WITH GFR  TROPONIN T, HIGH SENSITIVITY    EKG: None  Radiology: DG Chest 2 View Result Date: 11/03/2024 EXAM: 2 VIEW(S) XRAY OF THE CHEST 11/03/2024 10:56:00 AM COMPARISON: None available. CLINICAL HISTORY: sob  FINDINGS: LUNGS AND PLEURA: No focal pulmonary opacity. No pleural effusion. No pneumothorax. HEART AND MEDIASTINUM: No acute abnormality of the cardiac and mediastinal silhouettes. BONES AND SOFT TISSUES: No acute osseous abnormality. IMPRESSION: 1. No acute cardiopulmonary process. Electronically signed by: Donnice Mania MD 11/03/2024 12:18 PM EST RP Workstation: [id number]     Procedures   Medications Ordered in the ED  ipratropium-albuterol  (DUONEB) 0.5-2.5 (3) MG/3ML nebulizer solution 3 mL (3 mLs Nebulization  Given 11/03/24 1312)  predniSONE  (DELTASONE ) tablet 60 mg (60 mg Oral Given 11/03/24 1312)    Clinical Course as of 11/03/24 1523  Sat Nov 03, 2024  1250 CBC and metabolic panel unremarkable.  Troponin normal [JK]  1424 CBC with Differential(!) CBC normal.  Metabolic panel normal.  COVID flu RSV negative [JK]  1424 DG Chest 2 View Chest x-ray without acute findings [JK]    Clinical Course User Index [JK] Randol Simmonds, MD                                 Medical Decision Making Amount and/or Complexity of Data Reviewed Labs: ordered. Decision-making details documented in ED Course. Radiology: ordered. Decision-making details documented in ED Course.  Risk Prescription drug management.   Patient has history of COPD.  She presents ED with complaints of worsening shortness of breath.  Patient feels like this is more than just a COPD exacerbation.  ED workup without signs of pneumonia.  No anemia.  No signs of dehydration.  Cardiac enzymes are normal.  No signs of acute cardiac ischemia.  Patient did have some wheezing on exam.  She was treated with breathing treatments and steroids.  D-dimer was elevated.  Will proceed with CT angio to rule out pulmonary embolism.  Case turned over to Dr. Bari at shift change     Final diagnoses:  None    ED Discharge Orders     None          Randol Simmonds, MD 11/03/24 1525  "

## 2024-11-03 NOTE — ED Provider Notes (Signed)
 Patient signed out to me by previous provider. Please refer to their note for full HPI.  Briefly this is a 63 year old female who presented with shortness of breath, cough for the past couple days.  Has scattered wheezing on initial exam by previous provider.  Treated symptomatically.   Patient signed out pending CT to rule out PE or other acute findings.   ED Course: CT PE study is negative.  Discussed with the patient COPD exacerbation and treatment.  Patient endorses to me productive cough, fever.  Will add in course of antibiotics for further treatment.  Plan for outpatient follow-up.  Patient at this time appears safe and stable for discharge and close outpatient follow up. Discharge plan and strict return to ED precautions discussed, patient verbalizes understanding and agreement.   Bari Roxie HERO, DO 11/03/24 [id number]
